# Patient Record
Sex: Male | Born: 1940 | Race: White | Hispanic: No | Marital: Married | Smoking: Former smoker
Health system: Southern US, Community
[De-identification: ages and names within clinical notes are randomized; demographics above are authoritative.]

## PROBLEM LIST (undated history)

## (undated) DIAGNOSIS — I70208 Unspecified atherosclerosis of native arteries of extremities, other extremity: Secondary | ICD-10-CM

## (undated) DIAGNOSIS — Z8679 Personal history of other diseases of the circulatory system: Secondary | ICD-10-CM

## (undated) DIAGNOSIS — I050 Rheumatic mitral stenosis: Secondary | ICD-10-CM

## (undated) DIAGNOSIS — K922 Gastrointestinal hemorrhage, unspecified: Secondary | ICD-10-CM

## (undated) DIAGNOSIS — J449 Chronic obstructive pulmonary disease, unspecified: Secondary | ICD-10-CM

## (undated) DIAGNOSIS — R0600 Dyspnea, unspecified: Secondary | ICD-10-CM

## (undated) DIAGNOSIS — I272 Pulmonary hypertension, unspecified: Secondary | ICD-10-CM

## (undated) DIAGNOSIS — I2510 Atherosclerotic heart disease of native coronary artery without angina pectoris: Secondary | ICD-10-CM

## (undated) DIAGNOSIS — Z9289 Personal history of other medical treatment: Secondary | ICD-10-CM

## (undated) DIAGNOSIS — Z953 Presence of xenogenic heart valve: Secondary | ICD-10-CM

## (undated) DIAGNOSIS — N400 Enlarged prostate without lower urinary tract symptoms: Secondary | ICD-10-CM

## (undated) DIAGNOSIS — I5032 Chronic diastolic (congestive) heart failure: Secondary | ICD-10-CM

## (undated) DIAGNOSIS — I099 Rheumatic heart disease, unspecified: Secondary | ICD-10-CM

## (undated) DIAGNOSIS — N390 Urinary tract infection, site not specified: Secondary | ICD-10-CM

## (undated) DIAGNOSIS — Z9889 Other specified postprocedural states: Secondary | ICD-10-CM

## (undated) DIAGNOSIS — K219 Gastro-esophageal reflux disease without esophagitis: Secondary | ICD-10-CM

## (undated) DIAGNOSIS — I4891 Unspecified atrial fibrillation: Secondary | ICD-10-CM

## (undated) HISTORY — DX: Other specified postprocedural states: Z98.890

## (undated) HISTORY — DX: Urinary tract infection, site not specified: N39.0

## (undated) HISTORY — DX: Rheumatic heart disease, unspecified: I09.9

## (undated) HISTORY — DX: Atherosclerotic heart disease of native coronary artery without angina pectoris: I25.10

## (undated) HISTORY — PX: CATARACT EXTRACTION, BILATERAL: SHX1313

## (undated) HISTORY — DX: Gastrointestinal hemorrhage, unspecified: K92.2

## (undated) HISTORY — DX: Unspecified atherosclerosis of native arteries of extremities, other extremity: I70.208

## (undated) HISTORY — DX: Rheumatic mitral stenosis: I05.0

## (undated) HISTORY — DX: Unspecified atrial fibrillation: I48.91

## (undated) HISTORY — DX: Chronic diastolic (congestive) heart failure: I50.32

## (undated) HISTORY — DX: Chronic obstructive pulmonary disease, unspecified: J44.9

## (undated) HISTORY — DX: Pulmonary hypertension, unspecified: I27.20

## (undated) HISTORY — DX: Personal history of other diseases of the circulatory system: Z86.79

## (undated) HISTORY — DX: Benign prostatic hyperplasia without lower urinary tract symptoms: N40.0

---

## 2009-11-30 DIAGNOSIS — K922 Gastrointestinal hemorrhage, unspecified: Secondary | ICD-10-CM

## 2009-11-30 HISTORY — PX: PERCUTANEOUS CORONARY STENT INTERVENTION (PCI-S): SHX6016

## 2009-11-30 HISTORY — DX: Gastrointestinal hemorrhage, unspecified: K92.2

## 2009-12-19 DIAGNOSIS — I2510 Atherosclerotic heart disease of native coronary artery without angina pectoris: Secondary | ICD-10-CM

## 2009-12-19 HISTORY — DX: Atherosclerotic heart disease of native coronary artery without angina pectoris: I25.10

## 2010-05-19 ENCOUNTER — Ambulatory Visit: Payer: Self-pay | Admitting: Thoracic Surgery (Cardiothoracic Vascular Surgery)

## 2010-06-09 ENCOUNTER — Ambulatory Visit: Payer: Self-pay | Admitting: Thoracic Surgery (Cardiothoracic Vascular Surgery)

## 2010-06-14 ENCOUNTER — Inpatient Hospital Stay (HOSPITAL_COMMUNITY)
Admission: RE | Admit: 2010-06-14 | Discharge: 2010-06-23 | Payer: Self-pay | Admitting: Thoracic Surgery (Cardiothoracic Vascular Surgery)

## 2010-06-17 ENCOUNTER — Encounter: Payer: Self-pay | Admitting: Thoracic Surgery (Cardiothoracic Vascular Surgery)

## 2010-06-18 ENCOUNTER — Ambulatory Visit: Payer: Self-pay | Admitting: Thoracic Surgery (Cardiothoracic Vascular Surgery)

## 2010-06-18 DIAGNOSIS — Z9889 Other specified postprocedural states: Secondary | ICD-10-CM

## 2010-06-18 DIAGNOSIS — Z8679 Personal history of other diseases of the circulatory system: Secondary | ICD-10-CM | POA: Insufficient documentation

## 2010-06-18 HISTORY — DX: Other specified postprocedural states: Z98.890

## 2010-06-18 HISTORY — PX: MAZE: SHX5063

## 2010-06-18 HISTORY — DX: Personal history of other diseases of the circulatory system: Z86.79

## 2010-06-18 HISTORY — PX: MITRAL VALVE REPAIR: SHX2039

## 2010-06-30 ENCOUNTER — Ambulatory Visit: Payer: Self-pay | Admitting: Thoracic Surgery (Cardiothoracic Vascular Surgery)

## 2010-06-30 ENCOUNTER — Encounter
Admission: RE | Admit: 2010-06-30 | Discharge: 2010-06-30 | Payer: Self-pay | Admitting: Thoracic Surgery (Cardiothoracic Vascular Surgery)

## 2010-08-11 ENCOUNTER — Encounter
Admission: RE | Admit: 2010-08-11 | Discharge: 2010-08-11 | Payer: Self-pay | Admitting: Thoracic Surgery (Cardiothoracic Vascular Surgery)

## 2010-08-11 ENCOUNTER — Ambulatory Visit: Payer: Self-pay | Admitting: Thoracic Surgery (Cardiothoracic Vascular Surgery)

## 2010-11-10 ENCOUNTER — Ambulatory Visit: Payer: Self-pay | Admitting: Thoracic Surgery (Cardiothoracic Vascular Surgery)

## 2011-02-14 LAB — BLOOD GAS, ARTERIAL
Bicarbonate: 27.1 mEq/L — ABNORMAL HIGH (ref 20.0–24.0)
Drawn by: 103701
FIO2: 0.21 %
O2 Saturation: 95.3 %
TCO2: 28.2 mmol/L (ref 0–100)
pO2, Arterial: 65.1 mmHg — ABNORMAL LOW (ref 80.0–100.0)

## 2011-02-14 LAB — POCT I-STAT 3, ART BLOOD GAS (G3+)
Acid-base deficit: 3 mmol/L — ABNORMAL HIGH (ref 0.0–2.0)
Acid-base deficit: 3 mmol/L — ABNORMAL HIGH (ref 0.0–2.0)
Acid-base deficit: 3 mmol/L — ABNORMAL HIGH (ref 0.0–2.0)
Acid-base deficit: 4 mmol/L — ABNORMAL HIGH (ref 0.0–2.0)
Acid-base deficit: 6 mmol/L — ABNORMAL HIGH (ref 0.0–2.0)
Bicarbonate: 22.3 mEq/L (ref 20.0–24.0)
Bicarbonate: 23.6 mEq/L (ref 20.0–24.0)
O2 Saturation: 100 %
O2 Saturation: 94 %
O2 Saturation: 98 %
Patient temperature: 37.6
Patient temperature: 38.1
TCO2: 22 mmol/L (ref 0–100)
TCO2: 23 mmol/L (ref 0–100)
TCO2: 25 mmol/L (ref 0–100)
pCO2 arterial: 35.8 mmHg (ref 35.0–45.0)
pCO2 arterial: 47.6 mmHg — ABNORMAL HIGH (ref 35.0–45.0)
pH, Arterial: 7.395 (ref 7.350–7.450)
pO2, Arterial: 358 mmHg — ABNORMAL HIGH (ref 80.0–100.0)
pO2, Arterial: 70 mmHg — ABNORMAL LOW (ref 80.0–100.0)

## 2011-02-14 LAB — POCT I-STAT 4, (NA,K, GLUC, HGB,HCT)
Glucose, Bld: 103 mg/dL — ABNORMAL HIGH (ref 70–99)
Glucose, Bld: 106 mg/dL — ABNORMAL HIGH (ref 70–99)
Glucose, Bld: 145 mg/dL — ABNORMAL HIGH (ref 70–99)
Glucose, Bld: 158 mg/dL — ABNORMAL HIGH (ref 70–99)
Glucose, Bld: 92 mg/dL (ref 70–99)
Glucose, Bld: 96 mg/dL (ref 70–99)
Glucose, Bld: 99 mg/dL (ref 70–99)
Glucose, Bld: 99 mg/dL (ref 70–99)
HCT: 18 % — ABNORMAL LOW (ref 39.0–52.0)
HCT: 23 % — ABNORMAL LOW (ref 39.0–52.0)
HCT: 23 % — ABNORMAL LOW (ref 39.0–52.0)
HCT: 27 % — ABNORMAL LOW (ref 39.0–52.0)
Hemoglobin: 6.8 g/dL — CL (ref 13.0–17.0)
Hemoglobin: 7.5 g/dL — ABNORMAL LOW (ref 13.0–17.0)
Hemoglobin: 7.5 g/dL — ABNORMAL LOW (ref 13.0–17.0)
Hemoglobin: 7.8 g/dL — ABNORMAL LOW (ref 13.0–17.0)
Hemoglobin: 8.8 g/dL — ABNORMAL LOW (ref 13.0–17.0)
Hemoglobin: 9.2 g/dL — ABNORMAL LOW (ref 13.0–17.0)
Potassium: 2.4 mEq/L — CL (ref 3.5–5.1)
Potassium: 3.3 mEq/L — ABNORMAL LOW (ref 3.5–5.1)
Potassium: 3.4 mEq/L — ABNORMAL LOW (ref 3.5–5.1)
Potassium: 3.5 mEq/L (ref 3.5–5.1)
Potassium: 3.5 mEq/L (ref 3.5–5.1)
Potassium: 3.6 mEq/L (ref 3.5–5.1)
Potassium: 4.2 mEq/L (ref 3.5–5.1)
Sodium: 128 mEq/L — ABNORMAL LOW (ref 135–145)
Sodium: 134 mEq/L — ABNORMAL LOW (ref 135–145)
Sodium: 140 mEq/L (ref 135–145)
Sodium: 140 mEq/L (ref 135–145)
Sodium: 141 mEq/L (ref 135–145)

## 2011-02-14 LAB — BASIC METABOLIC PANEL
BUN: 14 mg/dL (ref 6–23)
BUN: 15 mg/dL (ref 6–23)
CO2: 23 mEq/L (ref 19–32)
CO2: 27 mEq/L (ref 19–32)
CO2: 28 mEq/L (ref 19–32)
CO2: 28 mEq/L (ref 19–32)
CO2: 29 mEq/L (ref 19–32)
Calcium: 7.7 mg/dL — ABNORMAL LOW (ref 8.4–10.5)
Calcium: 7.8 mg/dL — ABNORMAL LOW (ref 8.4–10.5)
Calcium: 7.8 mg/dL — ABNORMAL LOW (ref 8.4–10.5)
Calcium: 8.2 mg/dL — ABNORMAL LOW (ref 8.4–10.5)
Chloride: 100 mEq/L (ref 96–112)
Chloride: 108 mEq/L (ref 96–112)
Creatinine, Ser: 0.66 mg/dL (ref 0.4–1.5)
Creatinine, Ser: 0.77 mg/dL (ref 0.4–1.5)
Creatinine, Ser: 0.83 mg/dL (ref 0.4–1.5)
Creatinine, Ser: 1.03 mg/dL (ref 0.4–1.5)
GFR calc Af Amer: >60 mL/min (ref >60)
GFR calc Af Amer: >60 mL/min (ref >60)
GFR calc Af Amer: >60 mL/min (ref >60)
GFR calc Af Amer: >60 mL/min (ref >60)
GFR calc Af Amer: >60 mL/min (ref >60)
GFR calc non Af Amer: >60 mL/min (ref >60)
GFR calc non Af Amer: >60 mL/min (ref >60)
GFR calc non Af Amer: >60 mL/min (ref >60)
Glucose, Bld: 101 mg/dL — ABNORMAL HIGH (ref 70–99)
Glucose, Bld: 104 mg/dL — ABNORMAL HIGH (ref 70–99)
Glucose, Bld: 106 mg/dL — ABNORMAL HIGH (ref 70–99)
Glucose, Bld: 134 mg/dL — ABNORMAL HIGH (ref 70–99)
Potassium: 3.2 mEq/L — ABNORMAL LOW (ref 3.5–5.1)
Potassium: 3.2 mEq/L — ABNORMAL LOW (ref 3.5–5.1)
Sodium: 132 mEq/L — ABNORMAL LOW (ref 135–145)
Sodium: 133 mEq/L — ABNORMAL LOW (ref 135–145)
Sodium: 135 mEq/L (ref 135–145)

## 2011-02-14 LAB — CBC
HCT: 20.9 % — ABNORMAL LOW (ref 39.0–52.0)
HCT: 22.6 % — ABNORMAL LOW (ref 39.0–52.0)
HCT: 25.3 % — ABNORMAL LOW (ref 39.0–52.0)
HCT: 25.3 % — ABNORMAL LOW (ref 39.0–52.0)
HCT: 26.1 % — ABNORMAL LOW (ref 39.0–52.0)
HCT: 26.2 % — ABNORMAL LOW (ref 39.0–52.0)
HCT: 32.4 % — ABNORMAL LOW (ref 39.0–52.0)
Hemoglobin: 10.1 g/dL — ABNORMAL LOW (ref 13.0–17.0)
Hemoglobin: 10.2 g/dL — ABNORMAL LOW (ref 13.0–17.0)
Hemoglobin: 7.6 g/dL — ABNORMAL LOW (ref 13.0–17.0)
Hemoglobin: 8 g/dL — ABNORMAL LOW (ref 13.0–17.0)
Hemoglobin: 8.5 g/dL — ABNORMAL LOW (ref 13.0–17.0)
Hemoglobin: 8.6 g/dL — ABNORMAL LOW (ref 13.0–17.0)
Hemoglobin: 8.8 g/dL — ABNORMAL LOW (ref 13.0–17.0)
Hemoglobin: 8.9 g/dL — ABNORMAL LOW (ref 13.0–17.0)
MCH: 26.9 pg (ref 26.0–34.0)
MCH: 27.6 pg (ref 26.0–34.0)
MCH: 27.8 pg (ref 26.0–34.0)
MCH: 28.3 pg (ref 26.0–34.0)
MCH: 28.5 pg (ref 26.0–34.0)
MCH: 28.6 pg (ref 26.0–34.0)
MCH: 28.7 pg (ref 26.0–34.0)
MCH: 28.8 pg (ref 26.0–34.0)
MCH: 28.9 pg (ref 26.0–34.0)
MCHC: 32.1 g/dL (ref 30.0–36.0)
MCHC: 32.5 g/dL (ref 30.0–36.0)
MCHC: 32.6 g/dL (ref 30.0–36.0)
MCHC: 32.9 g/dL (ref 30.0–36.0)
MCHC: 33.4 g/dL (ref 30.0–36.0)
MCHC: 33.5 g/dL (ref 30.0–36.0)
MCHC: 33.5 g/dL (ref 30.0–36.0)
MCHC: 33.5 g/dL (ref 30.0–36.0)
MCV: 84.3 fL (ref 78.0–100.0)
MCV: 86.1 fL (ref 78.0–100.0)
MCV: 86.6 fL (ref 78.0–100.0)
Platelets: 146 10*3/uL — ABNORMAL LOW (ref 150–400)
Platelets: 227 10*3/uL (ref 150–400)
Platelets: 240 10*3/uL (ref 150–400)
Platelets: 306 10*3/uL (ref 150–400)
Platelets: 325 10*3/uL (ref 150–400)
Platelets: 372 10*3/uL (ref 150–400)
Platelets: 413 10*3/uL — ABNORMAL HIGH (ref 150–400)
RBC: 2.65 MIL/uL — ABNORMAL LOW (ref 4.22–5.81)
RBC: 2.77 MIL/uL — ABNORMAL LOW (ref 4.22–5.81)
RBC: 2.79 MIL/uL — ABNORMAL LOW (ref 4.22–5.81)
RBC: 2.94 MIL/uL — ABNORMAL LOW (ref 4.22–5.81)
RBC: 3.02 MIL/uL — ABNORMAL LOW (ref 4.22–5.81)
RBC: 3.04 MIL/uL — ABNORMAL LOW (ref 4.22–5.81)
RBC: 3.15 MIL/uL — ABNORMAL LOW (ref 4.22–5.81)
RBC: 3.19 MIL/uL — ABNORMAL LOW (ref 4.22–5.81)
RBC: 3.63 MIL/uL — ABNORMAL LOW (ref 4.22–5.81)
RBC: 3.7 MIL/uL — ABNORMAL LOW (ref 4.22–5.81)
RDW: 17.1 % — ABNORMAL HIGH (ref 11.5–15.5)
RDW: 17.3 % — ABNORMAL HIGH (ref 11.5–15.5)
RDW: 17.5 % — ABNORMAL HIGH (ref 11.5–15.5)
RDW: 17.9 % — ABNORMAL HIGH (ref 11.5–15.5)
WBC: 16.2 10*3/uL — ABNORMAL HIGH (ref 4.0–10.5)
WBC: 7.8 10*3/uL (ref 4.0–10.5)
WBC: 8.7 10*3/uL (ref 4.0–10.5)
WBC: 9.1 10*3/uL (ref 4.0–10.5)
WBC: 9.6 10*3/uL (ref 4.0–10.5)

## 2011-02-14 LAB — TYPE AND SCREEN
ABO/RH(D): A POS
ABO/RH(D): A POS
Antibody Screen: NEGATIVE

## 2011-02-14 LAB — HEMOGLOBIN AND HEMATOCRIT, BLOOD: HCT: 20 % — ABNORMAL LOW (ref 39.0–52.0)

## 2011-02-14 LAB — POCT I-STAT, CHEM 8
Calcium, Ion: 1.09 mmol/L — ABNORMAL LOW (ref 1.12–1.32)
HCT: 26 % — ABNORMAL LOW (ref 39.0–52.0)
HCT: 26 % — ABNORMAL LOW (ref 39.0–52.0)
Hemoglobin: 8.8 g/dL — ABNORMAL LOW (ref 13.0–17.0)
Hemoglobin: 8.8 g/dL — ABNORMAL LOW (ref 13.0–17.0)
Sodium: 137 mEq/L (ref 135–145)
Sodium: 138 mEq/L (ref 135–145)
TCO2: 20 mmol/L (ref 0–100)
TCO2: 23 mmol/L (ref 0–100)

## 2011-02-14 LAB — GLUCOSE, CAPILLARY
Glucose-Capillary: 102 mg/dL — ABNORMAL HIGH (ref 70–99)
Glucose-Capillary: 104 mg/dL — ABNORMAL HIGH (ref 70–99)
Glucose-Capillary: 105 mg/dL — ABNORMAL HIGH (ref 70–99)
Glucose-Capillary: 105 mg/dL — ABNORMAL HIGH (ref 70–99)
Glucose-Capillary: 108 mg/dL — ABNORMAL HIGH (ref 70–99)
Glucose-Capillary: 109 mg/dL — ABNORMAL HIGH (ref 70–99)
Glucose-Capillary: 126 mg/dL — ABNORMAL HIGH (ref 70–99)
Glucose-Capillary: 142 mg/dL — ABNORMAL HIGH (ref 70–99)
Glucose-Capillary: 63 mg/dL — ABNORMAL LOW (ref 70–99)
Glucose-Capillary: 98 mg/dL (ref 70–99)
Glucose-Capillary: 99 mg/dL (ref 70–99)

## 2011-02-14 LAB — URINALYSIS, DIPSTICK ONLY
Bilirubin Urine: NEGATIVE
Hgb urine dipstick: NEGATIVE
Specific Gravity, Urine: 1.017 (ref 1.005–1.030)
Urobilinogen, UA: 1 mg/dL (ref 0.0–1.0)

## 2011-02-14 LAB — GLUCOSE, SEROUS FLUID: Glucose, Fluid: 96 mg/dL

## 2011-02-14 LAB — DIFFERENTIAL
Basophils Absolute: 0 10*3/uL (ref 0.0–0.1)
Basophils Relative: 0 % (ref 0–1)
Eosinophils Absolute: 0.2 10*3/uL (ref 0.0–0.7)
Eosinophils Relative: 3 % (ref 0–5)
Lymphocytes Relative: 9 % — ABNORMAL LOW (ref 12–46)
Lymphs Abs: 0.9 10*3/uL (ref 0.7–4.0)
Monocytes Absolute: 0.8 10*3/uL (ref 0.1–1.0)
Monocytes Relative: 9 % (ref 3–12)
Neutro Abs: 7.6 10*3/uL (ref 1.7–7.7)
Neutrophils Relative %: 80 % — ABNORMAL HIGH (ref 43–77)

## 2011-02-14 LAB — MAGNESIUM
Magnesium: 2.1 mg/dL (ref 1.5–2.5)
Magnesium: 3.1 mg/dL — ABNORMAL HIGH (ref 1.5–2.5)

## 2011-02-14 LAB — PROTIME-INR
INR: 1.08 (ref 0.00–1.49)
Prothrombin Time: 13.9 seconds (ref 11.6–15.2)
Prothrombin Time: 17.5 seconds — ABNORMAL HIGH (ref 11.6–15.2)

## 2011-02-14 LAB — ABO/RH: ABO/RH(D): A POS

## 2011-02-14 LAB — COMPREHENSIVE METABOLIC PANEL
Alkaline Phosphatase: 102 U/L (ref 39–117)
BUN: 17 mg/dL (ref 6–23)
Calcium: 8.8 mg/dL (ref 8.4–10.5)
Chloride: 103 mEq/L (ref 96–112)
Creatinine, Ser: 1.2 mg/dL (ref 0.4–1.5)
GFR calc Af Amer: >60 mL/min (ref >60)
Potassium: 3.9 mEq/L (ref 3.5–5.1)
Sodium: 139 mEq/L (ref 135–145)
Total Bilirubin: 0.4 mg/dL (ref 0.3–1.2)

## 2011-02-14 LAB — CREATININE, SERUM
Creatinine, Ser: 0.65 mg/dL (ref 0.4–1.5)
GFR calc Af Amer: >60 mL/min (ref >60)
GFR calc Af Amer: >60 mL/min (ref >60)
GFR calc non Af Amer: >60 mL/min (ref >60)

## 2011-02-14 LAB — BODY FLUID CULTURE: Culture: NO GROWTH

## 2011-02-14 LAB — BODY FLUID CELL COUNT WITH DIFFERENTIAL
Eos, Fluid: 0 %
Neutrophil Count, Fluid: 4 % (ref 0–25)

## 2011-02-14 LAB — PREPARE PLATELETS

## 2011-02-14 LAB — PREPARE FRESH FROZEN PLASMA

## 2011-02-14 LAB — LACTATE DEHYDROGENASE, PLEURAL OR PERITONEAL FLUID

## 2011-04-14 NOTE — Assessment & Plan Note (Signed)
OFFICE VISIT   JAVI, BOLLMAN  DOB:  1941-09-21                                        June 30, 2010  CHART #:  [id number]   HISTORY OF PRESENT ILLNESS:  The patient returns for routine followup  status post right miniature thoracotomy for mitral valve repair and maze  procedure on June 18, 2010.  His postoperative recovery has been  uncomplicated.  Since hospital discharge, the patient continued to do  remarkably well.  He returns to the office today with no complaints  other than the fact that he has had some serous drainage from his right  groin incision.  He has had no shortness of breath, and in fact he  states that his breathing is much better than it was prior to surgery.  He has had mild soreness in the right lateral chest wall, but this has  not bothered him much and he is hardly taking any pain relievers.  He  uses them primarily only to help him rest at night.  He has been up and  about and ambulating regularly.  He has been going to [employer] and  walking inside where it is air conditioned.  His exercise tolerance is  improving nicely.  His appetite is good.  He is sleeping well.  Overall,  he has absolutely no complaints.  Medications remain unchanged from the  time of hospital discharge.   PHYSICAL EXAMINATION:  Notable for a well-appearing thin male with blood  pressure 104/66, pulse 64 and regular.  Two-channel telemetry rhythm  strip demonstrates normal sinus rhythm.  Oxygen saturation is 99% on  room air.  Examination of the chest reveals mini thoracotomy incision  that is healing nicely.  Auscultation reveals clear breath sounds which  are hollow and distant, typical for the patient's underlying chronic  obstructive pulmonary disease.  No wheezes, rales, or rhonchi are noted.  Cardiovascular exam demonstrates regular rate and rhythm.  No murmurs,  rubs, or gallops are noted.  The abdomen is soft, nontender.  The  extremities are warm and  well perfused.  There is no lower extremity  edema.  The right groin incision is clean and intact.  There is no  surrounding erythema.  There has been some serous drainage, and on  palpation some clear serous fluid can be expressed through the incision  itself consistent with lymph.  No other abnormalities are noted.   DIAGNOSTIC TESTS:  Chest x-ray performed today at the [facility] is reviewed.  This demonstrates clear lung fields bilaterally  with the exception of small bilateral pleural effusions, more so on the  right than the left.  The right pleural effusion appears to be partially  loculated.  The lung fields are otherwise clear.  No other abnormalities  are noted.   IMPRESSION:  The patient appears to be doing very well.  He does have a  small loculated right pleural effusion.  He also appears to have  developed a lymphocele that has spontaneously drained through his right  groin surgical incision.  There is no sign of infection.  Overall, he is  doing well and he is maintaining sinus rhythm.  He feels terrific.   PLAN:  I have encouraged the patient to continue to gradually increase  his physical activity as tolerated.  I think that within  the next week  he can start driving an automobile once the soreness in his right  lateral chest wall has continued to abate.  I have instructed him to  keep his right groin incision clean and dry.  As long as it is draining,  he should keep a dry dressing.  I have cautioned he and his wife to keep  an eye on it and make sure and call and return to see [place] here in the  office should he develop any increased swelling, redness, or change in  drainage to have more purulent or suspicious appearance of character.  All of his questions have been addressed.  We will plan to see him back  in 4 weeks for further followup with repeat chest x-ray.  We have not  made any changes in his current medications.   Salvatore Decent. Cornelius Moras, M.D.   Electronically Signed   CHO/MEDQ  D:  06/30/2010  T:  06/30/2010  Job:  [id number]   cc:   Ruben Reason. Rhona Leavens, MD  [facility]

## 2011-04-14 NOTE — Assessment & Plan Note (Signed)
OFFICE VISIT   Carlos Norton, Carlos Norton  DOB:  January 02, 1941                                        August 11, 2010  CHART #:  [id number]   HISTORY OF PRESENT ILLNESS:  The patient returns for followup status  post right mini thoracotomy for mitral valve repair and maze procedure  on June 18, 2010.  He was last seen here in the office on June 30, 2010.  Since then, he has continued to do very well.  He was seen in  followup by Dr. Rhona Leavens last week.  He has had no problems at all.  The  patient returns to the office today and states that he feels quite well.  He has minimal residual soreness in his chest.  He is not taking any  sort of pain relievers any longer.  He has no shortness of breath either  with activity or at rest.  He has been increasing his physical activity  without difficulty.  He has not had any tachy palpitations or dizzy  spells.  His appetite is good.  He is sleeping well at night.  He has  absolutely no complaints.   CURRENT MEDICATIONS:  1. Amiodarone 200 mg daily.  2. Aspirin 81 mg daily.  3. Caltrate.  4. Lipitor.  5. Plavix.  6. Potassium.  7. Lasix.  8. Coreg.  9. Protonix.  10.Xopenex.  11.Spiriva.  12.Symbicort.  13.Albuterol.   PHYSICAL EXAMINATION:  Notable for a well-appearing male with blood  pressure 131/72, pulse 51 and regular, and oxygen saturation 98% on room  air.  Two-channel telemetry rhythm strip demonstrates sinus rhythm.  Examination of the chest reveals well-healed mini thoracotomy incision.  Breath sounds are clear to auscultation and symmetrical bilaterally.  No  wheezes, rales, or rhonchi are noted.  Cardiovascular exam includes  regular rate and rhythm.  No murmurs, rubs, or gallops are appreciated.  The abdomen is soft and nontender.  The right groin incision has healed  completely.  There is no lower extremity edema.  The extremities are  warm and well perfused.  The remainder of his physical exam is  noncontributory.   DIAGNOSTIC TESTS:  Chest x-ray performed today at the [facility] is reviewed.  This demonstrates trivial residual right pleural  effusion, which is considerably decreasing a person with his last x-ray.  The lung fields are otherwise clear without any significant abnormality.   IMPRESSION:  Excellent progress following right mini thoracotomy for  mitral valve repair and maze procedure.  The patient is doing very well  and is maintaining sinus rhythm.   PLAN:  I have instructed the patient to cut his dose of amiodarone to  100 mg daily.  He will stop amiodarone completely in 2 months when he  runs out of his current prescription refill.  We have otherwise not made  any changes to his medications.  We will plan to see him back in 3  months' time for rhythm check and followup.  He has been advised that he  can return to normal physical activity at this point in time without any  physical restrictions.  All of his questions have been addressed.   Salvatore Decent. Cornelius Moras, M.D.  Electronically Signed   CHO/MEDQ  D:  08/11/2010  T:  08/12/2010  Job:  [id number]  cc:   Dr. Eulah Citizen  [facility]

## 2011-04-14 NOTE — H&P (Signed)
HISTORY AND PHYSICAL EXAMINATION   June 09, 2010   Re:  Carlos Norton          DOB:  [birth year]   Date of planned hospital admission is June 14, 2010.   REASON FOR CONSULTATION:  Severe mitral regurgitation.   HISTORY OF PRESENT ILLNESS:  The patient is a 70 year old gentleman from  [place], [place] with history of mitral regurgitation, congestive  heart failure, recurrent persistent atrial fibrillation and atrial  flutter, two-vessel coronary artery disease, and chronic obstructive  pulmonary disease.  The patient first developed symptoms of congestive  heart failure in January of this year, at which time he was hospitalized  at [facility].  He initially presented in class  IV congestive heart failure with pulmonary edema.  Cardiac  echocardiogram performed at that time revealed normal left ventricular  systolic function with moderate-to-severe mitral regurgitation.  CT scan  of the chest revealed no sign of pulmonary embolus, but confirmed the  presence of pulmonary edema and bilateral pleural effusions.  His heart  failure was treated medically.  He underwent cardiac catheterization and  was found to have severe two-vessel coronary artery disease with mitral  regurgitation.  He then underwent percutaneous coronary intervention  with placement of drug-eluting stents in both the large obtuse marginal  branch of the left circumflex coronary artery and the right coronary  artery.  He initially did well, but he was readmitted to the hospital  again the following month in rapid atrial fibrillation with recurrent  class IV congestive heart failure.  He was stabilized medically and  underwent transesophageal echocardiogram with DC cardioversion.  He also  underwent bilateral thoracentesis for bilateral pleural effusions.  He  was started on Coumadin at that time.  He was also receiving Plavix ever  since his coronary stents replaced  the month previously.  He went back  into atrial fibrillation and underwent repeat cardioversion in March of  this year.  He again developed class IV congestive heart failure after  this procedure for which he was hospitalized for several days.  He again  was treated medically and improved.  In May, he was again hospitalized  with what was felt to be hospital-acquired pneumonia as well as severe  anemia with occult GI blood loss.  The patient received a total of 12  units of blood and underwent upper GI endoscopy, colonoscopy, and later  capsule endoscopy.  A source for GI bleeding was never discovered.  Coumadin therapy was discontinued and he has been maintained only on  Plavix since that time.  He has not had any further GI bleeding.  He was  readmitted yet again to [facility] on May 10, 2010, with recurrent rapid atrial fibrillation and class IV congestive  heart failure.  Repeat transthoracic echocardiogram confirmed the  presence of severe mitral regurgitation with some decreased left  ventricular function, ejection fraction now down to 35%.  The patient  underwent repeat left and right heart catheterization.  This  demonstrated the presence of patent stents in both right coronary artery  and the obtuse marginal branch of the left circumflex coronary artery.  There was moderate to severe pulmonary hypertension.  Left ventricular  ejection fraction is estimated 45-50%.  Cardiothoracic surgical  consultation was requested to consider elective surgical intervention.  The patient now returns to our office today with plans to proceed with  elective mitral valve repair or replacement and maze procedure next  week.  Since his most recent hospital discharge, he has remained stable.  He has not had any further episodes of resting shortness of breath.  He  has not had any tachy palpitations.  His appetite is good.  He did  suffer a fall 2 weeks ago when he hit his  left side, but the soreness  from this has resolved.  He had some soreness in his ankle that has also  gotten better.  He otherwise remains quite stable.  He is scheduled to  see Dr. Eulis Foster for followup of his underlying chronic obstructive  pulmonary disease later this week.  He has been told that he will  undergo repeat pulmonary function testing at that time.  He desires to  go ahead and proceed with surgery as soon as practical given the fact  that he has been rehospitalized so many times over the last 6 months  with recurrent congestive heart failure.   REVIEW OF SYSTEMS:  Remain unchanged from his last consultation note.   PAST MEDICAL HISTORY:  1. Mitral regurgitation.  2. Congestive heart failure.  3. Recurrent persistent atrial fibrillation and atrial flutter.  4. Coronary artery disease status post PCI and stenting of the obtuse      marginal branch of the left circumflex coronary artery and the      right coronary artery, January 2011.  5. Chronic obstructive pulmonary disease.  6. Remote tobacco use.  7. Benign prostatic hypertrophy.  8. History of rheumatic fever during childhood.   PAST SURGICAL HISTORY:  Tonsillectomy.   FAMILY HISTORY:  Noncontributory.   CURRENT MEDICATIONS:  1. Amiodarone 200 mg twice daily.  2. Aspirin 81 mg daily.  3. Caltrate D 1 tablet daily.  4. Lipitor 20 mg daily.  5. Nitroglycerin as needed.  6. Plavix 75 mg daily.  7. Potassium 20 mEq twice daily.  8. Coreg 3.125 mg twice daily.  9. Protonix 40 mg twice daily.  10.Xopenex as needed.  11.Spiriva inhaler.  12.Symbicort inhaler.  13.Lasix 40 mg daily.   DRUG ALLERGIES:  None known.   PHYSICAL EXAMINATION:  The patient is a thin white male appears his  stated age, in no acute distress.  Oxygen saturation 98% on room air.  HEENT exam is unrevealing.  Auscultation of the chest reveals clear  breath sounds with somewhat hollow breath sounds.  No wheezes, rales, or  rhonchi are  noted.  Cardiovascular exam is notable for regular rate and  rhythm.  There is a grade 2/6 systolic murmur heard best along the apex  with radiation to the axilla.  No diastolic murmurs are noted.  The  abdomen is soft and nontender.  The extremities are warm and well  perfused.  There is no lower extremity edema.  Distal pulses are  palpable in both lower legs at the ankle.  Rectal and GU exams are both  deferred.  Neurologic examination is grossly nonfocal and symmetrical  throughout.   DIAGNOSTIC TESTS:  Transesophageal echocardiogram performed May 16, 2010, at [facility] is reviewed.  This  demonstrates severe mitral regurgitation with functional anatomy  consistent with rheumatic heart disease.  There is functional type IIIA  restriction of both the anterior and posterior leaflets during both  systole and diastole.  The posterior leaflet is particularly thickened  and restricted.  There does not appear to be significant mitral  stenosis.  There is mild left ventricular dysfunction.  Left and right  heart catheterization performed May 16, 2010, is also reviewed.  This  demonstrates patent stents in the right coronary artery and proximal  portion of the obtuse marginal branch of the left circumflex coronary  artery with otherwise no significant flow-limiting coronary artery  disease.  There is moderate pulmonary hypertension.  Aortogram of the  abdominal aorta and iliac vessels reveals no sign of significant  aortoiliac disease.   IMPRESSION:  Severe mitral regurgitation with longstanding history of  rheumatic heart disease now with recurrent episodes of class IV  congestive heart failure and recurrent persistent atrial fibrillation.  The patient also recently underwent percutaneous coronary intervention  and stenting of the left circumflex coronary artery and the right  coronary artery using drug-eluting stents in January 2011.   PLAN:  We discussed  treating him medically for another six months or so  to allow him to stop Plavix with less risk of acute stent thrombosis,  but Mr. Congrove is appropriately concerned by the fact that he has been  hospitalized 5 times since the first of the year with class IV  congestive heart failure.  As such it probably makes sense to proceed  with surgery at this time.  We plan hospital admission on June 14, 2010,  to begin Integrilin infusion to bridge the patient between now and the  time of surgery.  He has been instructed to stop taking Plavix after  taking it on Wednesday, June 11, 2010.  We will bring him in to the  hospital on the 16th to begin Plavix infusion.  We tentatively plan to  proceed with surgery on Wednesday, June 18, 2010.  All of his questions  have been addressed.   Carlos Norton, M.D.  Electronically Signed   CHO/MEDQ  D:  06/09/2010  T:  06/10/2010  Job:  [id number]   cc:   Eulah Citizen, MD  Bethanie Dicker, MD  Tarri Fuller, MD

## 2011-04-14 NOTE — Assessment & Plan Note (Signed)
OFFICE VISIT   Carlos Norton, Carlos Norton  DOB:  Nov 09, 1941                                        November 10, 2010  CHART #:  [id number]   HISTORY OF PRESENT ILLNESS:  The patient returns for followup status  post right mini thoracotomy for mitral valve repair and maze procedure  on June 18, 2010.  He was last seen here in the office on August 11, 2010.  Since then, the patient has done exceptionally well overall.  He  was seen in followup by Dr. Rhona Leavens in the office and underwent a followup  echocardiogram that reportedly looked good.  We have not seen the report  from this exam as of yet.  Several weeks later, the patient did develop  an episode of shingles, but he is recovering from this fairly well at  this point in time.  He has not had any shortness of breath and he  reports that his breathing is much better than it was a year ago.  He  has not had any tachy palpitations or dizzy spells.  He has been off of  amiodarone now for more than a month and he overall feels quite well  other than his recent episode of shingles.  The remainder of his review  of systems is completely unremarkable.   CURRENT MEDICATIONS:  Aspirin, Plavix, Lipitor, Coreg, Protonix, Xopenex  inhaler, Spiriva, Symbicort inhaler, Lasix, Caltrate, and iron.   PHYSICAL EXAMINATION:  Notable for well-appearing male with blood  pressure 106/64 and pulse is 66 and regular.  Two-channel telemetry  rhythm strip demonstrates normal sinus rhythm.  Oxygen saturation is  100% on room air.  Examination of the chest reveals clear breath sounds  that are symmetrical bilaterally.  No wheezes, rales, or rhonchi are  noted.  Cardiovascular exam is notable for regular rate and rhythm.  No  murmurs, rubs, or gallops are noted.  The abdomen is soft and nontender.  The extremities are warm and well perfused.  There is no lower extremity  edema.  The remainder of his physical exam is unremarkable.   IMPRESSION:  The patient is doing exceptionally well following mitral  valve repair and a maze procedure last July.  He is maintaining sinus  rhythm and getting along very nicely.   PLAN:  We will plan to see the patient back for routine followup and  rhythm check in 6 months.  All of his questions have been addressed.   Salvatore Decent. Cornelius Moras, M.D.  Electronically Signed   CHO/MEDQ  D:  11/10/2010  T:  11/11/2010  Job:  [id number]   cc:   Dr. Su Grand

## 2011-04-17 NOTE — Consult Note (Signed)
NEW PATIENT CONSULTATION   Carlos Norton, Carlos Norton  DOB:                                                    May 20, 2008  CHART #:  [id number]   REASON FOR CONSULTATION:  Severe mitral regurgitation.   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  The patient states that he has been  reasonably healthy most of his life and free of significant medical  problems until January of this year.  At that time, he developed  progressive symptoms of shortness of breath that ultimately culminated  in hospital admission December 13, 2009, at [facility] medical record number [phone]).  At that time, the  patient was hospitalized with acute pulmonary edema.  Cardiac enzymes  revealed mildly elevated Troponin.  A 2-D echocardiogram revealed normal  left ventricular systolic function with moderate-to-severe mitral  regurgitation.  CT scan of the chest revealed no sign of any pulmonary  embolism, but confirmed the presence of pulmonary edema and pleural  effusions.  He was treated medically and ultimately underwent cardiac  catheterization.  He was found to have severe two-vessel coronary artery  disease in addition to his mitral regurgitation and congestive heart  failure.  He was treated with percutaneous coronary intervention and  placement of drug-eluting stents in the large obtuse marginal branch of  the left circumflex coronary artery and the right coronary artery.  He  was readmitted to the hospital in February with rapid atrial  fibrillation and recurrent class IV congestive heart failure.  He was  stabilized medically and underwent transesophageal echocardiogram with  DC  cardioversion.  He also underwent bilateral thoracentesis for  bilateral pleural effusions during that hospital admission.  He was  started on Coumadin in addition to the Plavix he had been receiving  following his coronary stents.  He went back into atrial fibrillation  and underwent repeat DC cardioversion on February 27, 2010.  This procedure  was complicated by acute pulmonary edema and respiratory failure for  which he was hospitalized for several days.  He responded to medical  therapy and was again discharged home.  In May, he was hospitalized with  fevers, chills, and what was felt to be hospital-acquired pneumonia  complicated by anemia secondary to hematochezia.  He underwent upper GI  endoscopy and colonoscopy, and he later underwent capsule endoscopy.  A  definitive source for GI bleeding was never encountered.  Coumadin  therapy was discontinued.  He has not had any GI bleeding since.  He was  readmitted to the hospital again on May 10, 2010, with recurrent rapid  atrial fibrillation and congestive heart failure associated with resting  shortness of breath and orthopnea.  During this hospitalization, he  underwent followup transthoracic echocardiogram on May 12, 2010.  This  confirmed the presence of moderate-to-severe mitral regurgitation with  now decreased left ventricular function and ejection fraction estimated  35%.  There is moderate to severe left atrial enlargement with mild  pulmonary hypertension and mild tricuspid regurgitation.  The patient  later underwent transesophageal echocardiogram on  May 16, 2010.  This  confirmed the presence of severe mitral regurgitation.  The aortic valve  was normal and there was no aortic insufficiency.  There was trivial  tricuspid regurgitation.  Left ventricular ejection fraction was  estimated 45-50%.  No other significant abnormalities were noted.  Left  and right heart catheterization was performed that same day.  This   demonstrated presence of patent stents in the right coronary artery and  the obtuse marginal branch of the left circumflex coronary artery  without significant restenosis or other progression of coronary artery  disease.  There is moderate-to-severe pulmonary hypertension with PA  pressures measured 61/28 with pulmonary capillary wedge pressure of 19  and large V-waves on the wedge tracing consistent with severe mitral  regurgitation.  Cardiothoracic surgical consultation has now been  requested.   REVIEW OF SYSTEMS:  GENERAL:  The patient reports normal appetite prior  to admission.  His energy level has been down for several months and he  feels fatigued.  CARDIAC:  The patient denies any recent history of chest pain, chest  tightness, or chest pressure other than some mild tightness that occurs  in association with tachy palpitations.  Prior to admission, the patient  was complaining of resting shortness of breath and orthopnea.  He has  not had lower extremity edema.  His breathing has improved with medical  therapy and improved rate control of his atrial fibrillation and atrial  flutter.  He denies any syncopal episodes or dizzy spells.  RESPIRATORY:  Notable for the absence of any productive cough,  hemoptysis, wheezing.  GASTROINTESTINAL:  Negative.  The patient reports normal bowel function  with some occasional constipation.  He has not had any hematochezia,  hematemesis, nor melena since he stopped taking Coumadin.  GENITOURINARY:  Negative.  PERIPHERAL VASCULAR:  Negative.  MUSCULOSKELETAL:  Negative.  HEENT:  Negative.  INFECTIOUS:  Negative.   PAST MEDICAL HISTORY:  1. Mitral regurgitation.  2. Congestive heart failure.  3. Recurrent persistent atrial fibrillation and atrial flutter.  4. Coronary artery disease.  5. Chronic obstructive pulmonary disease.  6. Remote tobacco use.  7. Benign prostatic hypertrophy.  8. History of rheumatic fever during childhood.    PAST SURGICAL HISTORY:  Tonsillectomy.   FAMILY HISTORY:  Noncontributory.   CURRENT MEDICATIONS:  1. Amiodarone 200 mg twice daily.  2. Aspirin 81 mg daily.  3. Caltrate 600+D 1 tablet twice daily.  4. Lipitor 20 mg daily.  5. Plavix 75 mg daily.  6. Potassium chloride 20 mEq twice daily.  7. Coreg 3.125 mg twice daily.  8. Protonix 40 mg twice daily.  9. Spiriva 1 puff daily.  10.Symbicort inhaler 1-2 puffs twice daily.  11.Xopenex 2 puffs every 6 hours as needed.  12.Albuterol 2 puffs as needed.  13.Nitroglycerin 0.4 mg sublingual as needed.   DRUG ALLERGIES:  None known.   PHYSICAL EXAMINATION:  The patient is a thin white male who appears his  stated age, in no acute distress.  Pulse is regular.  Two-channel  telemetry rhythm strip demonstrates what may be atrial flutter.  Examination of the chest reveals somewhat barrel-shaped chest.  Breath  sounds are clear and symmetrical.  Cardiovascular exam is notable for  regular rate and rhythm.  There is a grade 3/6 systolic murmur heard  best at the apex with radiation all across the precordium, to the  axilla, and back.  No diastolic murmurs are noted.  The abdomen is soft,  nondistended, and nontender.  There are no palpable masses.  The  extremities are warm and adequately perfused.  There is no lower  extremity edema.  Femoral pulses are palpable.  Distal pulses are  thready, but palpable.  There is no sign of venous insufficiency.  Rectal and GU exams are both deferred.  Neurologic examination is  grossly nonfocal and symmetrical throughout.   DIAGNOSTIC TESTS:  Transesophageal echocardiogram performed by Dr. Rhona Leavens  on May 16, 2010, is reviewed.  This demonstrates severe mitral  regurgitation.  There is restriction of both the anterior and posterior  leaflets of the mitral valve during both systole and diastole  (functional type IIIA).  The posterior leaflet is particularly thickened  and restricted.  In some views, the  anterior leaflet does not look too  bad and moves reasonably well.  There does not appear to be any  significant mitral stenosis.  There is severe mitral regurgitation.  The  jet of regurgitation is brought in central with slight eccentric  orientation towards the posterior left atrium.  The jet fills the left  atrium.  There is mild left ventricular dysfunction.  The aortic valve  appears normal.  There is trivial tricuspid regurgitation.  The left  atrium was enlarged.   Left and right heart catheterization performed May 16, 2010, by Dr.  Rhona Leavens is also reviewed.  This demonstrates patent stents in the right  coronary artery and in the proximal portion of the large obtuse marginal  branch of the left circumflex coronary artery.  There is otherwise no  significant flow-limiting coronary artery disease appreciated.  There is  moderate pulmonary hypertension with data as noted previously.  Aortogram of the abdominal aorta and iliac vessels reveals no sign of  significant aortoiliac disease.   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  He first presented with congestive heart failure  in January.  At that time, he was noted to have two-vessel coronary  artery disease and he was treated with percutaneous coronary  intervention and stenting using drug-eluting stents.  He has been  rehospitalized four times since then with a combination of related  problems including recurrent congestive heart failure, recurrent  persistent atrial fibrillation, and iron-deficient anemia while on  Coumadin and Plavix therapy.  Transesophageal echocardiogram and  catheterization confirms the presence of severe mitral regurgitation.  I  agree that he would best be treated with surgical intervention.  It is  possible that his mitral valve may be repairable, although the  functional anatomy is consistent with rheumatic disease.  Long-term  durability following  mitral valve repair in these circumstances may be  somewhat limited, but nonetheless potentially better than valve  replacement if the valve itself can be repaired.  Alternatives include  continued medical therapy versus proceeding with surgery.  I favor  surgical intervention.  Concomitant Maze procedure should be considered.  He may be a reasonably good candidate for minimally invasive approach.  Nonetheless, the risks of surgery will be elevated due to his comorbid  medical problems and now decreased left ventricular function.   PLAN:  I have discussed matters at length with the patient and his wife  here in his hospital room at [facility].  Alternative treatment strategies have been discussed.  He desires to  proceed with surgery as soon as practical.  We plan to allow him to  recover from his recent acute exacerbation of congestive heart failure.  We will plan to see  him in the office on June 09, 2010.  If he continues  to do well at that time, we will make plans to stop his Plavix in  anticipation of surgery on June 18, 2010.  All of his questions have  been addressed.   Salvatore Decent. Cornelius Moras, M.D.  Electronically Signed   CHO/MEDQ  D:  05/20/2010  T:  05/20/2010  Job:  [id number]   cc:   Dr. Eulah Citizen  [facility]

## 2011-05-04 ENCOUNTER — Encounter (INDEPENDENT_AMBULATORY_CARE_PROVIDER_SITE_OTHER): Payer: Medicare Other | Admitting: Thoracic Surgery (Cardiothoracic Vascular Surgery)

## 2011-05-04 DIAGNOSIS — I059 Rheumatic mitral valve disease, unspecified: Secondary | ICD-10-CM

## 2011-05-04 DIAGNOSIS — I4891 Unspecified atrial fibrillation: Secondary | ICD-10-CM

## 2011-05-04 NOTE — Assessment & Plan Note (Signed)
OFFICE VISIT  Carlos, Norton DOB:  1941-08-24                                        May 04, 2011 CHART #:  [id number]  HISTORY OF PRESENT ILLNESS:  The patient returns for followup status post right miniature thoracotomy for mitral valve repair and maze procedure on June 18, 2010.  He was last seen here in the office on November 10, 2010.  Since then, he has continued to do very well.  He states that he had a followup echocardiogram performed at Dr. Johnna Acosta office in March of this year.  Clinically, he has done exceptionally well.  He has no shortness of breath.  He has no chest pain.  He has no tachypalpitations.  His exercise tolerance is quite good.  The remainder of his review of systems is entirely unremarkable.  The remainder of his past medical history is unchanged.  PHYSICAL EXAMINATION:  A well-appearing male with blood pressure 109/66, pulse 54 and regular.  Two-channel telemetry rhythm strip demonstrates normal sinus rhythm.  Examination of the chest reveals clear breath sounds that are symmetrical bilaterally.  Cardiovascular exam is notable for regular rate and rhythm.  No murmurs, rubs, or gallops are noted. The abdomen is soft and nontender.  The extremities are warm and well perfused.  There is no lower extremity edema.  IMPRESSION:  The patient appears to be doing exceptionally well now almost 1 year following mitral valve repair and maze procedure.  He is maintaining sinus rhythm and doing very nicely.  PLAN:  We will ask that a copy of the report from the echocardiogram performed in March of this year, be sent for our records and review.  We will plan to see the patient back in 1 year's time for routine followup and rhythm check.  Carlos Norton. Cornelius Moras, M.D. Electronically Signed  CHO/MEDQ  D:  05/04/2011  T:  05/04/2011  Job:  [id number]  cc:   Eulah Citizen, MD Tarri Fuller, MD [facility]

## 2012-05-02 ENCOUNTER — Encounter: Payer: Medicare Other | Admitting: Thoracic Surgery (Cardiothoracic Vascular Surgery)

## 2012-05-16 ENCOUNTER — Encounter: Payer: Medicare Other | Admitting: Thoracic Surgery (Cardiothoracic Vascular Surgery)

## 2012-06-13 ENCOUNTER — Encounter: Payer: Medicare Other | Admitting: Thoracic Surgery (Cardiothoracic Vascular Surgery)

## 2012-06-27 ENCOUNTER — Encounter: Payer: Medicare Other | Admitting: Thoracic Surgery (Cardiothoracic Vascular Surgery)

## 2012-07-18 ENCOUNTER — Encounter: Payer: Medicare Other | Admitting: Thoracic Surgery (Cardiothoracic Vascular Surgery)

## 2012-08-15 ENCOUNTER — Ambulatory Visit (INDEPENDENT_AMBULATORY_CARE_PROVIDER_SITE_OTHER): Payer: Medicare Other | Admitting: Thoracic Surgery (Cardiothoracic Vascular Surgery)

## 2012-08-15 ENCOUNTER — Encounter: Payer: Self-pay | Admitting: Thoracic Surgery (Cardiothoracic Vascular Surgery)

## 2012-08-15 ENCOUNTER — Other Ambulatory Visit: Payer: Self-pay

## 2012-08-15 VITALS — BP 105/63 | HR 60 | Ht 72.0 in | Wt 168.0 lb

## 2012-08-15 DIAGNOSIS — Z8679 Personal history of other diseases of the circulatory system: Secondary | ICD-10-CM

## 2012-08-15 DIAGNOSIS — N400 Enlarged prostate without lower urinary tract symptoms: Secondary | ICD-10-CM

## 2012-08-15 DIAGNOSIS — I099 Rheumatic heart disease, unspecified: Secondary | ICD-10-CM

## 2012-08-15 DIAGNOSIS — Z9889 Other specified postprocedural states: Secondary | ICD-10-CM

## 2012-08-15 DIAGNOSIS — J449 Chronic obstructive pulmonary disease, unspecified: Secondary | ICD-10-CM

## 2012-08-15 HISTORY — DX: Rheumatic heart disease, unspecified: I09.9

## 2012-08-15 HISTORY — DX: Chronic obstructive pulmonary disease, unspecified: J44.9

## 2012-08-15 HISTORY — DX: Benign prostatic hyperplasia without lower urinary tract symptoms: N40.0

## 2012-08-15 NOTE — Progress Notes (Signed)
                   [address]            [name] [address]          [phone]     CARDIOTHORACIC SURGERY OFFICE NOTE  Referring Provider is Theodoro Grist, MD PCP is Tarri Fuller, MD   HPI:  Patient returns for routine followup now more than 2 years status post minimally invasive mitral valve repair and maze procedure. He was last seen here in our office on 05/04/2011. Since then the patient has continued to do exceptionally well. He reports normal exercise tolerance with no shortness of breath at all. He has not had any chest pain. He has not had any palpitations or others signs of recurrence of atrial fibrillation. He has otherwise been in good health and reports no problems at all.   Current Outpatient Prescriptions  Medication Sig Dispense Refill  . atorvastatin (LIPITOR) 20 MG tablet       . carvedilol (COREG) 3.125 MG tablet       . clopidogrel (PLAVIX) 75 MG tablet       . furosemide (LASIX) 20 MG tablet       . KLOR-CON 10 10 MEQ tablet       . pantoprazole (PROTONIX) 40 MG tablet       . SPIRIVA HANDIHALER 18 MCG inhalation capsule           Physical Exam:   BP 105/63  Pulse 60  Ht 6' (1.829 m)  Wt 168 lb (76.204 kg)  BMI 22.78 kg/m2  SpO2 98%  General:  Well-appearing  Chest:   Clear to auscultation  CV:   Regular rate and rhythm without murmur  Incisions:  Completely healed  Abdomen:  Soft and nontender  Extremities:  Warm and well-perfused  Diagnostic Tests:  2 channel telemetry rhythm strip demonstrates normal sinus rhythm   Impression:  Patient is doing very well more than 2 years status post mitral valve repair and Maze procedure via right mini thoracotomy approach. He is maintaining sinus rhythm and enjoying normal exercise tolerance.  Plan:  In the future the patient will call and return to see [place] as needed. All of his questions been addressed.   Salvatore Decent. Cornelius Moras, MD 08/15/2012 2:35 PM

## 2016-04-09 DIAGNOSIS — I70208 Unspecified atherosclerosis of native arteries of extremities, other extremity: Secondary | ICD-10-CM | POA: Insufficient documentation

## 2016-04-09 HISTORY — PX: BRACHIAL EMBOLECTOMY: SHX1259

## 2016-04-09 HISTORY — DX: Unspecified atherosclerosis of native arteries of extremities, other extremity: I70.208

## 2016-05-07 HISTORY — PX: GREEN LIGHT LASER TURP (TRANSURETHRAL RESECTION OF PROSTATE: SHX6260

## 2016-07-10 HISTORY — PX: CARDIAC CATHETERIZATION: SHX172

## 2016-07-16 ENCOUNTER — Institutional Professional Consult (permissible substitution) (INDEPENDENT_AMBULATORY_CARE_PROVIDER_SITE_OTHER): Payer: Medicare Other | Admitting: Thoracic Surgery (Cardiothoracic Vascular Surgery)

## 2016-07-16 ENCOUNTER — Encounter: Payer: Self-pay | Admitting: Thoracic Surgery (Cardiothoracic Vascular Surgery)

## 2016-07-16 VITALS — BP 118/66 | HR 58 | Resp 16 | Ht 71.5 in | Wt 165.0 lb

## 2016-07-16 DIAGNOSIS — I70208 Unspecified atherosclerosis of native arteries of extremities, other extremity: Secondary | ICD-10-CM

## 2016-07-16 DIAGNOSIS — I099 Rheumatic heart disease, unspecified: Secondary | ICD-10-CM

## 2016-07-16 DIAGNOSIS — I272 Other secondary pulmonary hypertension: Secondary | ICD-10-CM

## 2016-07-16 DIAGNOSIS — Z8679 Personal history of other diseases of the circulatory system: Secondary | ICD-10-CM

## 2016-07-16 DIAGNOSIS — I050 Rheumatic mitral stenosis: Secondary | ICD-10-CM | POA: Diagnosis not present

## 2016-07-16 DIAGNOSIS — I5032 Chronic diastolic (congestive) heart failure: Secondary | ICD-10-CM

## 2016-07-16 DIAGNOSIS — I742 Embolism and thrombosis of arteries of the upper extremities: Secondary | ICD-10-CM | POA: Diagnosis not present

## 2016-07-16 DIAGNOSIS — Z9889 Other specified postprocedural states: Secondary | ICD-10-CM

## 2016-07-16 NOTE — Progress Notes (Signed)
[address]       [name] [id number]             [phone]     CARDIOTHORACIC SURGERY CONSULTATION REPORT  Referring Provider is Caryl Adahiu, Jenyung Andy, MD PCP is Tarri FullerESCAJEDA, RICHARD, MD  Chief Complaint  Patient presents with  . Mitral Stenosis    s/p MINI MVREPAIR/MAZE 2011...ECHO 04/10/16, CATH 07/10/16    HPI:  Patient is a 75 year old male with long-standing history of rheumatic heart disease, coronary artery disease status post PCI and stenting in the past, atrial fibrillation, and COPD who underwent minimally invasive mitral valve repair and Maze procedure on 06/18/2010 for severe mitral regurgitation and recurrent persistent atrial fibrillation.  The patient did well postoperatively and was last seen here in our office on 08/15/2012. He has never had any documented recurrence of atrial fibrillation and he was taken off of Coumadin anticoagulation because of history of intolerance due to GI bleeding while he was taking Coumadin and Plavix prior to his surgery. 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 He was [facility] where he underwent surgical embolectomy on 04/09/2016. He states that initially he was thought to be having a stroke but this was ruled out and ultimately the thrombus in the left brachial artery was identified. Transthoracic echocardiogram performed at that time revealed left ventricular hypertrophy with normal left ventricular systolic function, ejection fraction estimated 55-60%. There was mitral annular calcification with calcification of the mitral valve leaflets and "moderate to severe functional stenosis". The mean transvalvular gradient across the mitral valve was estimated 14 mmHg with mitral valve area calculated 0.85 cm. A definite source of the embolus was not identified and to the patient's  recollection he did not undergo TEE.  He was started on Eliquis at that time. After his surgical procedure he developed severe problems with bladder outlet obstruction for which he ultimately underwent transurethral laser prostatectomy in early June.  Ever since the patient has had problems with progressive symptoms of exertional shortness of breath.  He was seen in follow-up by Dr. Rhona Leavenshiu and underwent left and right heart catheterization on 07/10/2016. He was found to have mild nonobstructive coronary artery disease. Previously placed stents in the right coronary artery and the left circumflex coronary artery were widely patent. There was severe pulmonary hypertension with PA pressures measured 72/26 and mean pulmonary capillary wedge pressure 26 mmHg. Mean transvalvular gradient across the mitral valve was estimated 20 mmHg. The patient was referred for surgical consultation.  The patient is married and lives locally in [place] with his wife. He retired from [employer] business approximately one year ago. [place] amount of time there. The patient has remained physically active during his retirement until recently when he has been limited by exertional shortness of breath.  He states that he now gets short of breath with mild to moderate activity in this limits his activity to a significant degree. He denies any history of resting shortness of breath, PND, orthopnea, or lower extremity edema. He has not had any chest pain or chest tightness either with activity or at rest. He has not had any palpitations nor other symptoms to suggest a recurrence of atrial fibrillation. He states that while he was in the hospital recently he has always  been in sinus rhythm. He has not had any bleeding problems since he started taking Eliquis.  Past Medical History:  Diagnosis Date  . BPH (benign prostatic hyperplasia) 08/15/2012  . 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 Chronic diastolic (congestive) heart failure (HCC)   . COPD (chronic obstructive pulmonary disease) (HCC) 08/15/2012  . Coronary artery disease 12/19/2009   PCI and stenting of RCA and OM branch  . GI bleeding 2011   while patient on coumadin and Plavix - no source identified  . Mitral stenosis   . Pulmonary hypertension (HCC)   . Rheumatic heart disease 08/15/2012  . S/P Maze operation for atrial fibrillation 06/18/2010   Left side lesion set using cryothermy with oversewing of LA appendage  . 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 BRACHIAL EMBOLECTOMY Left 04/09/2016  . GREEN LIGHT LASER TURP (TRANSURETHRAL RESECTION OF PROSTATE  05/07/2016  . MAZE  06/18/2010  . MITRAL VALVE REPAIR  06/18/2010  . PERCUTANEOUS CORONARY STENT INTERVENTION (PCI-S)  11/2009   RCA and LCx    No family history on file.  Social History   Social History  . Marital status: Married    Spouse name: N/A  . Number of children: N/A  . Years of education: N/A   Occupational History  . Not on file.   Social History Main Topics  . Smoking status: Former Games developer  . Smokeless tobacco: Not on file  . Alcohol use Not on file  . Drug use: Unknown  . Sexual activity: Not on file   Other Topics Concern  . Not on file   Social History Narrative  . No narrative on file    Current Outpatient Prescriptions  Medication Sig Dispense Refill  . apixaban (ELIQUIS) 2.5 MG TABS tablet Take 2.5 mg by mouth 2 (two) times daily.    Marland Kitchen aspirin EC 81 MG tablet Take 81 mg by mouth daily.    Marland Kitchen atorvastatin (LIPITOR) 20 MG tablet     . budesonide-formoterol (SYMBICORT) 80-4.5 MCG/ACT inhaler Inhale 2 puffs into the lungs 2 (two) times daily.    . Calcium Carbonate-Vitamin D3 (CALCIUM 600/VITAMIN D) 600-400 MG-UNIT TABS  Take 1 tablet by mouth daily.    . furosemide (LASIX) 20 MG tablet 20 mg. Take on M-W-F    . KLOR-CON 10 10 MEQ tablet 10 mEq every other day.     . metoprolol succinate (TOPROL-XL) 25 MG 24 hr tablet Take 25 mg by mouth daily.    . pantoprazole (PROTONIX) 40 MG tablet 40 mg daily. TAKE AT 0600    . sildenafil (VIAGRA) 100 MG tablet Take 100 mg by mouth daily as needed for erectile dysfunction.    Marland Kitchen SPIRIVA HANDIHALER 18 MCG inhalation capsule 18 mcg daily.      No current facility-administered medications for this visit.     No Known Allergies    Review of Systems:   General:  normal appetite, normal energy, no weight gain, + 9 lb weight loss, no fever  Cardiac:  no chest pain with exertion, no chest pain at rest, + SOB with exertion, no resting SOB, no PND, no orthopnea, no palpitations, no arrhythmia, + remote h/o atrial fibrillation, no LE edema, no dizzy spells, no syncope  Respiratory:  + shortness of breath, no home  oxygen, no productive cough, no dry cough, no bronchitis, no wheezing, no hemoptysis, no asthma, no pain with inspiration or cough, no sleep apnea, no CPAP at night  GI:   no difficulty swallowing, no reflux, no frequent heartburn, no hiatal hernia, no abdominal pain, no constipation, no diarrhea, no hematochezia, no hematemesis, no melena  GU:   no dysuria,  no frequency, no urinary tract infection, no hematuria, no enlarged prostate, no kidney stones, no kidney disease  Vascular:  no pain suggestive of claudication, no pain in feet, + leg cramps, no varicose veins, no DVT, no non-healing foot ulcer  Neuro:   no stroke, no TIA's, no seizures, no headaches, no temporary blindness one eye,  no slurred speech, no peripheral neuropathy, no chronic pain, no instability of gait, no memory/cognitive dysfunction  Musculoskeletal: no arthritis, no joint swelling, no myalgias, no difficulty walking, normal mobility   Skin:   no rash, no itching, no skin infections, no pressure  sores or ulcerations  Psych:   no anxiety, no depression, no nervousness, no unusual recent stress  Eyes:   no blurry vision, no floaters, no recent vision changes, does not wear glasses or contacts  ENT:   no hearing loss, no loose or painful teeth, full set dentures, last saw dentist 6 months ago  Hematologic:  no easy bruising, no abnormal bleeding, no clotting disorder, no frequent epistaxis  Endocrine:  no diabetes, does not check CBG's at home     Physical Exam:   BP 118/66   Pulse (!) 58   Resp 16   Ht 5' 11.5" (1.816 m)   Wt 165 lb (74.8 kg)   SpO2 98% Comment: ON RA  BMI 22.69 kg/m   General:  Thin,  well-appearing  HEENT:  Unremarkable   Neck:   no JVD, no bruits, no adenopathy   Chest:   clear to auscultation, symmetrical breath sounds, no wheezes, no rhonchi   CV:   RRR, no murmur   Abdomen:  soft, non-tender, no masses   Extremities:  warm, well-perfused, pulses palpable, no LE edema  Rectal/GU  Deferred  Neuro:   Grossly non-focal and symmetrical throughout  Skin:   Clean and dry, no rashes, no breakdown   Diagnostic Tests:  TRANSTHORACIC ECHOCARDIOGRAM  Both the images and report from transthoracic echocardiogram performed 04/10/2016 are reviewed. There is left ventricular hypertrophy with normal left ventricular systolic function, left ventricular ejection fraction estimated 55-60%. There is a mitral annuloplasty ring in place consistent with previous mitral repair. There is no significant mitral regurgitation. There is mitral annular calcification with calcification of the mitral valve leaflets and "moderate to severe functional stenosis". Mitral valve area was calculated 0.85 cm. Mean transvalvular gradient ranged between 12 and 17 mmHg with peak velocity across the valve greater than 3 m/s. There was mild left atrial enlargement. Right ventricular size and function appeared normal. There was mild tricuspid regurgitation.   CARDIAC CATHETERIZATION  Both  images and report from diagnostic cardiac catheterization performed 07/10/2016 are reviewed. The patient has mild nonobstructive coronary artery disease. There are widely patent stents in both the left circumflex and the right coronary arteries. There is normal left ventricular systolic function. There is severe pulmonary hypertension with PA pressures measured 72/26. Central venous pressure was 4. Mean pulmonary Wedge pressure was 27. Transvalvular gradient across mitral valve was estimated 20 mmHg. Cardiac output was 3.9 L/m using thermodilution.    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  He presents with recent onset symptoms of exertional shortness of breath consistent with chronic diastolic congestive heart failure, New York Heart Association functional class IIB-III.  I have personally reviewed the patient's most recent transthoracic echocardiogram and diagnostic cardiac catheterization. Images from the echocardiogram are suboptimal but suggestive of significant progression of fibrosis and calcification in the mitral valve leaflets and transvalvular gradients appear to be fairly high.  The patient's left ventricular function remains normal but he has severe pulmonary hypertension. I agree that he may need to undergo mitral valve replacement. Given the patient's previous mitral valve repair and the degree of leaflet calcification, I would expect that balloon valvuloplasty probably not yield a favorable result.  Prior to the development of symptoms of shortness of breath the patient suffered a sudden embolic occlusion of left brachial artery for which he underwent embolectomy in May of this year. The patient has remote history of atrial fibrillation and underwent Maze procedure in 2011.  He has been maintaining sinus rhythm ever since and was not on pharmacologic  anticoagulation at the time of his embolic event. He is currently anticoagulated using Eliquis for presumed cardiac source. He apparently has not had transesophageal echocardiogram performed.  I favor proceeding with transesophageal echocardiogram to make certain there were no signs of left atrial clot and to more carefully evaluate the functional anatomy and severity of the patient's mitral stenosis. Assuming that TEE confirms the presence of severe mitral stenosis with findings unfavorable for balloon mitral valvuloplasty it would be reasonable to proceed with elective redo mitral valve replacement in the near future.   Plan:  The patient and his wife were counseled at length regarding the indications, risks and potential benefits of redo mitral valve replacement.  The rationale for elective surgery has been explained, including a comparison between surgery, balloon mitral valvuloplasty, and continued medical therapy with close follow-up.  We discussed the possibility of replacing the mitral valve using a mechanical prosthesis with the attendant need for long-term anticoagulation versus the alternative of replacing it using a bioprosthetic tissue valve with its potential for late structural valve deterioration and failure, depending upon the patient's longevity.   Expectations for the patient's postoperative convalescence a been discussed.  All of their questions have been answered.  The patient's wife has just recently been diagnosed with breast cancer and is scheduled to undergo mastectomy and breast reconstruction in the near future. The patient is hopeful that he can delay his mitral valve replacement until after his wife has recovered from her surgery. Under the circumstances it seems reasonable.  We will make arrangements for the patient undergo transesophageal echocardiogram in the near future. He will return to our office in approximately 2-3 weeks to review the results of his TEE and further  discuss the timing of surgery.   I spent in excess of 90 minutes during the conduct of this office consultation and >50% of this time involved direct face-to-face encounter with the patient for counseling and/or coordination of their care.    Salvatore Decentlarence H. Cornelius Moraswen, MD 07/16/2016 4:06 PM

## 2016-07-16 NOTE — Patient Instructions (Signed)
Continue all previous medications without any changes at this time  

## 2016-07-21 ENCOUNTER — Encounter: Payer: Medicare Other | Admitting: Thoracic Surgery (Cardiothoracic Vascular Surgery)

## 2016-07-28 ENCOUNTER — Encounter: Payer: Self-pay | Admitting: Thoracic Surgery (Cardiothoracic Vascular Surgery)

## 2016-07-28 ENCOUNTER — Ambulatory Visit (INDEPENDENT_AMBULATORY_CARE_PROVIDER_SITE_OTHER): Payer: Medicare Other | Admitting: Thoracic Surgery (Cardiothoracic Vascular Surgery)

## 2016-07-28 ENCOUNTER — Other Ambulatory Visit: Payer: Self-pay | Admitting: *Deleted

## 2016-07-28 VITALS — BP 115/67 | HR 64 | Resp 20 | Ht 71.5 in | Wt 165.0 lb

## 2016-07-28 DIAGNOSIS — I742 Embolism and thrombosis of arteries of the upper extremities: Secondary | ICD-10-CM | POA: Diagnosis not present

## 2016-07-28 DIAGNOSIS — Z9889 Other specified postprocedural states: Secondary | ICD-10-CM

## 2016-07-28 DIAGNOSIS — I050 Rheumatic mitral stenosis: Secondary | ICD-10-CM

## 2016-07-28 DIAGNOSIS — I340 Nonrheumatic mitral (valve) insufficiency: Secondary | ICD-10-CM | POA: Diagnosis not present

## 2016-07-28 DIAGNOSIS — I051 Rheumatic mitral insufficiency: Secondary | ICD-10-CM | POA: Insufficient documentation

## 2016-07-28 DIAGNOSIS — I099 Rheumatic heart disease, unspecified: Secondary | ICD-10-CM | POA: Diagnosis not present

## 2016-07-28 NOTE — Progress Notes (Signed)
[address]       [name] [id number]             [phone]     CARDIOTHORACIC SURGERY OFFICE NOTE  Referring Provider is Caryl Adahiu, Jenyung Andy, MD PCP is Tarri FullerESCAJEDA, RICHARD, MD   HPI:  Patient returns to the office today for follow-up of severe symptomatic mitral stenosis in the setting of long-standing rheumatic heart disease status post mitral valve repair and maze procedure in 2011. He was seen in consultation on 07/16/2016. Since then he underwent transesophageal echocardiogram. This confirmed the presence of severe mitral stenosis. Mean gradient across mitral valve was estimated 14 mmHg corresponding to mitral valve area calculated 0.85 cm. There was mild to moderate mitral regurgitation. Left ventricular systolic function remains normal. There was no sign of left atrial thrombus. No other significant abnormalities were noted. The patient returns to our office today to discuss the results of this test and discuss treatment options further. The patient's wife has recently been diagnosed with breast cancer and has been scheduled for surgery, but this has been postponed several times. The patient states that he describes stable symptoms of exertional shortness of breath and fatigue with a dry nonproductive cough. Symptoms have not changed over the past few weeks. He has been taking Lasix and this has not seemed to made any difference.   Current Outpatient Prescriptions  Medication Sig Dispense Refill  . apixaban (ELIQUIS) 2.5 MG TABS tablet Take 2.5 mg by mouth 2 (two) times daily.    Marland Kitchen. aspirin EC 81 MG tablet Take 81 mg by mouth daily.    Marland Kitchen. atorvastatin (LIPITOR) 20 MG tablet Take 20 mg by mouth daily at 6 PM.     . budesonide-formoterol (SYMBICORT) 80-4.5 MCG/ACT inhaler Inhale 2 puffs into the lungs 2 (two) times daily.    . Calcium Carbonate-Vitamin D3 (CALCIUM 600/VITAMIN D) 600-400 MG-UNIT TABS Take 1 tablet by mouth daily.    . furosemide (LASIX) 20 MG tablet  20 mg. Take on M-W-F    . KLOR-CON 10 10 MEQ tablet 10 mEq every other day.     . metoprolol succinate (TOPROL-XL) 25 MG 24 hr tablet Take 25 mg by mouth daily.    . pantoprazole (PROTONIX) 40 MG tablet 40 mg daily. TAKE AT 0600    . sildenafil (VIAGRA) 100 MG tablet Take 100 mg by mouth daily as needed for erectile dysfunction.    Marland Kitchen. SPIRIVA HANDIHALER 18 MCG inhalation capsule 18 mcg daily.      No current facility-administered medications for this visit.       Physical Exam:   BP 115/67 (BP Location: Left Arm, Patient Position: Sitting, Cuff Size: Normal)   Pulse 64   Resp 20   Ht 5' 11.5" (1.816 m)   Wt 165 lb (74.8 kg)   SpO2 97% Comment: RA  BMI 22.69 kg/m   General:  Well-appearing  Chest:   Clear to auscultation  CV:   Regular rate and rhythm  Incisions:  n/a  Abdomen:  Soft nontender  Extremities:  Warm and well-perfused  Diagnostic Tests:  TRANSESOPHAGEAL ECHOCARDIOGRAM  Images from transesophageal echocardiogram performed 07/22/2016 are personally reviewed. The patient has previously undergone complex mitral valve repair for rheumatic mitral valve disease. There is mild to moderate residual mitral regurgitation with 2 jets that are directed somewhat eccentrically across the left atrium. There is moderate thickening with severely restricted leaflet mobility involving the anterior leaflet of the mitral valve. The  posterior leaflet seems to move somewhat better but it also is somewhat restricted. There is severe mitral stenosis. There is no left atrial thrombus. The left atrial appendage has been obliterated. Left ventricular function appears normal.  The aortic valve appears normal. There is trace tricuspid regurgitation.   Impression:  Patient has long-standing rheumatic heart disease with stage D severe symptomatic mitral stenosis and moderate mitral regurgitation, status post mitral valve repair in 2011. Left ventricular systolic function remains preserved.  There is  no sign of left atrial thrombus. Options include continued medical therapy versus redo mitral valve replacement.    Plan:  The patient and his wife were again counseled at length regarding the indications, risks and potential benefits of redo mitral valve replacement.  The rationale for elective surgery has been explained, including a comparison between surgery, balloon mitral valvuloplasty, and continued medical therapy with close follow-up.  We discussed the possibility of replacing the mitral valve using a mechanical prosthesis with the attendant need for long-term anticoagulation versus the alternative of replacing it using a bioprosthetic tissue valve with its potential for late structural valve deterioration and failure, depending upon the patient's longevity.   The patient specifically requests that his mitral valve be replaced using a bioprosthetic tissue valve.   The patient understands and accepts all potential risks of surgery including but not limited to risk of death, stroke or other neurologic complication, myocardial infarction, congestive heart failure, respiratory failure, renal failure, bleeding requiring transfusion and/or reexploration, arrhythmia, infection or other wound complications, pneumonia, pleural and/or pericardial effusion, pulmonary embolus, aortic dissection or other major vascular complication, or delayed complications related to valve repair or replacement including but not limited to structural valve deterioration and failure, thrombosis, embolization, endocarditis, or paravalvular leak.  Expectations for the patient's postoperative convalescence a been discussed.  All of their questions have been answered.  For variety of personal reasons the patient desires to wait until the middle of October before proceeding with surgery. We tentatively plan for surgery on 09/16/2016. The patient will return to our office on Monday, 09/07/2016 for follow-up prior to surgery.  I spent  in excess of 15 minutes during the conduct of this office consultation and >50% of this time involved direct face-to-face encounter with the patient for counseling and/or coordination of their care.   Salvatore Decent. Cornelius Moras, MD 07/28/2016 3:39 PM

## 2016-07-28 NOTE — Patient Instructions (Signed)
Continue all previous medications without any changes at this time  

## 2016-08-31 ENCOUNTER — Inpatient Hospital Stay (HOSPITAL_COMMUNITY)
Admission: AD | Admit: 2016-08-31 | Discharge: 2016-09-09 | DRG: 308 | Disposition: A | Discharge status: Home / Self Care | Payer: Medicare Other | Source: Ambulatory Visit | Attending: Cardiology | Admitting: Cardiology

## 2016-08-31 ENCOUNTER — Encounter: Payer: Self-pay | Admitting: Thoracic Surgery (Cardiothoracic Vascular Surgery)

## 2016-08-31 ENCOUNTER — Ambulatory Visit (INDEPENDENT_AMBULATORY_CARE_PROVIDER_SITE_OTHER): Payer: Medicare Other | Admitting: Thoracic Surgery (Cardiothoracic Vascular Surgery)

## 2016-08-31 ENCOUNTER — Observation Stay (HOSPITAL_COMMUNITY): Payer: Medicare Other

## 2016-08-31 ENCOUNTER — Encounter (HOSPITAL_COMMUNITY): Payer: Self-pay | Admitting: Emergency Medicine

## 2016-08-31 VITALS — BP 103/69 | HR 130 | Resp 18 | Ht 71.5 in | Wt 154.0 lb

## 2016-08-31 DIAGNOSIS — I050 Rheumatic mitral stenosis: Secondary | ICD-10-CM | POA: Diagnosis present

## 2016-08-31 DIAGNOSIS — I340 Nonrheumatic mitral (valve) insufficiency: Secondary | ICD-10-CM | POA: Diagnosis present

## 2016-08-31 DIAGNOSIS — I742 Embolism and thrombosis of arteries of the upper extremities: Secondary | ICD-10-CM

## 2016-08-31 DIAGNOSIS — R0602 Shortness of breath: Secondary | ICD-10-CM

## 2016-08-31 DIAGNOSIS — J449 Chronic obstructive pulmonary disease, unspecified: Secondary | ICD-10-CM | POA: Diagnosis present

## 2016-08-31 DIAGNOSIS — I099 Rheumatic heart disease, unspecified: Secondary | ICD-10-CM | POA: Diagnosis present

## 2016-08-31 DIAGNOSIS — I481 Persistent atrial fibrillation: Secondary | ICD-10-CM

## 2016-08-31 DIAGNOSIS — Z8679 Personal history of other diseases of the circulatory system: Secondary | ICD-10-CM

## 2016-08-31 DIAGNOSIS — I480 Paroxysmal atrial fibrillation: Secondary | ICD-10-CM | POA: Diagnosis present

## 2016-08-31 DIAGNOSIS — K5750 Diverticulosis of both small and large intestine without perforation or abscess without bleeding: Secondary | ICD-10-CM | POA: Diagnosis present

## 2016-08-31 DIAGNOSIS — K259 Gastric ulcer, unspecified as acute or chronic, without hemorrhage or perforation: Secondary | ICD-10-CM | POA: Diagnosis present

## 2016-08-31 DIAGNOSIS — I5032 Chronic diastolic (congestive) heart failure: Secondary | ICD-10-CM

## 2016-08-31 DIAGNOSIS — N390 Urinary tract infection, site not specified: Secondary | ICD-10-CM | POA: Diagnosis not present

## 2016-08-31 DIAGNOSIS — K5521 Angiodysplasia of colon with hemorrhage: Secondary | ICD-10-CM | POA: Diagnosis present

## 2016-08-31 DIAGNOSIS — N179 Acute kidney failure, unspecified: Secondary | ICD-10-CM | POA: Diagnosis present

## 2016-08-31 DIAGNOSIS — I5033 Acute on chronic diastolic (congestive) heart failure: Secondary | ICD-10-CM | POA: Diagnosis present

## 2016-08-31 DIAGNOSIS — Z955 Presence of coronary angioplasty implant and graft: Secondary | ICD-10-CM

## 2016-08-31 DIAGNOSIS — K5710 Diverticulosis of small intestine without perforation or abscess without bleeding: Secondary | ICD-10-CM | POA: Diagnosis present

## 2016-08-31 DIAGNOSIS — Q2733 Arteriovenous malformation of digestive system vessel: Secondary | ICD-10-CM

## 2016-08-31 DIAGNOSIS — R578 Other shock: Secondary | ICD-10-CM | POA: Diagnosis present

## 2016-08-31 DIAGNOSIS — Z9889 Other specified postprocedural states: Secondary | ICD-10-CM

## 2016-08-31 DIAGNOSIS — Z7901 Long term (current) use of anticoagulants: Secondary | ICD-10-CM | POA: Diagnosis not present

## 2016-08-31 DIAGNOSIS — E871 Hypo-osmolality and hyponatremia: Secondary | ICD-10-CM | POA: Diagnosis present

## 2016-08-31 DIAGNOSIS — I4892 Unspecified atrial flutter: Secondary | ICD-10-CM | POA: Diagnosis not present

## 2016-08-31 DIAGNOSIS — R630 Anorexia: Secondary | ICD-10-CM | POA: Diagnosis present

## 2016-08-31 DIAGNOSIS — D509 Iron deficiency anemia, unspecified: Secondary | ICD-10-CM | POA: Diagnosis present

## 2016-08-31 DIAGNOSIS — B3781 Candidal esophagitis: Secondary | ICD-10-CM | POA: Diagnosis present

## 2016-08-31 DIAGNOSIS — K449 Diaphragmatic hernia without obstruction or gangrene: Secondary | ICD-10-CM | POA: Diagnosis present

## 2016-08-31 DIAGNOSIS — Z5309 Procedure and treatment not carried out because of other contraindication: Secondary | ICD-10-CM | POA: Diagnosis present

## 2016-08-31 DIAGNOSIS — I051 Rheumatic mitral insufficiency: Secondary | ICD-10-CM

## 2016-08-31 DIAGNOSIS — Z7982 Long term (current) use of aspirin: Secondary | ICD-10-CM

## 2016-08-31 DIAGNOSIS — I2510 Atherosclerotic heart disease of native coronary artery without angina pectoris: Secondary | ICD-10-CM | POA: Diagnosis present

## 2016-08-31 DIAGNOSIS — B9689 Other specified bacterial agents as the cause of diseases classified elsewhere: Secondary | ICD-10-CM | POA: Diagnosis present

## 2016-08-31 DIAGNOSIS — R0600 Dyspnea, unspecified: Secondary | ICD-10-CM

## 2016-08-31 DIAGNOSIS — I4819 Other persistent atrial fibrillation: Secondary | ICD-10-CM

## 2016-08-31 DIAGNOSIS — D500 Iron deficiency anemia secondary to blood loss (chronic): Secondary | ICD-10-CM

## 2016-08-31 DIAGNOSIS — I272 Pulmonary hypertension, unspecified: Secondary | ICD-10-CM

## 2016-08-31 DIAGNOSIS — Z87891 Personal history of nicotine dependence: Secondary | ICD-10-CM

## 2016-08-31 DIAGNOSIS — I509 Heart failure, unspecified: Secondary | ICD-10-CM

## 2016-08-31 DIAGNOSIS — E440 Moderate protein-calorie malnutrition: Secondary | ICD-10-CM | POA: Insufficient documentation

## 2016-08-31 DIAGNOSIS — K5909 Other constipation: Secondary | ICD-10-CM | POA: Diagnosis present

## 2016-08-31 DIAGNOSIS — J9811 Atelectasis: Secondary | ICD-10-CM

## 2016-08-31 DIAGNOSIS — I4891 Unspecified atrial fibrillation: Secondary | ICD-10-CM | POA: Insufficient documentation

## 2016-08-31 DIAGNOSIS — N400 Enlarged prostate without lower urinary tract symptoms: Secondary | ICD-10-CM | POA: Diagnosis present

## 2016-08-31 DIAGNOSIS — K254 Chronic or unspecified gastric ulcer with hemorrhage: Secondary | ICD-10-CM | POA: Diagnosis present

## 2016-08-31 LAB — URINALYSIS, ROUTINE W REFLEX MICROSCOPIC
Bilirubin Urine: NEGATIVE
Glucose, UA: NEGATIVE mg/dL
Ketones, ur: NEGATIVE mg/dL
NITRITE: NEGATIVE
PH: 5.5 (ref 5.0–8.0)
Protein, ur: NEGATIVE mg/dL
SPECIFIC GRAVITY, URINE: 1.029 (ref 1.005–1.030)

## 2016-08-31 LAB — COMPREHENSIVE METABOLIC PANEL
ALT: 28 U/L (ref 17–63)
AST: 42 U/L — AB (ref 15–41)
Albumin: 3.2 g/dL — ABNORMAL LOW (ref 3.5–5.0)
Alkaline Phosphatase: 138 U/L — ABNORMAL HIGH (ref 38–126)
Anion gap: 9 (ref 5–15)
BUN: 27 mg/dL — AB (ref 6–20)
CHLORIDE: 99 mmol/L — AB (ref 101–111)
CO2: 28 mmol/L (ref 22–32)
CREATININE: 1.24 mg/dL (ref 0.61–1.24)
Calcium: 9.1 mg/dL (ref 8.9–10.3)
GFR calc Af Amer: >60 mL/min (ref >60)
GFR, EST NON AFRICAN AMERICAN: 56 mL/min — AB (ref >60)
Glucose, Bld: 131 mg/dL — ABNORMAL HIGH (ref 65–99)
Potassium: 3.9 mmol/L (ref 3.5–5.1)
Sodium: 136 mmol/L (ref 135–145)
Total Bilirubin: 0.7 mg/dL (ref 0.3–1.2)
Total Protein: 6.3 g/dL — ABNORMAL LOW (ref 6.5–8.1)

## 2016-08-31 LAB — URINE MICROSCOPIC-ADD ON

## 2016-08-31 LAB — MRSA PCR SCREENING: MRSA BY PCR: NEGATIVE

## 2016-08-31 LAB — BRAIN NATRIURETIC PEPTIDE
B NATRIURETIC PEPTIDE 5: 877 pg/mL — AB (ref 0.0–100.0)
B Natriuretic Peptide: 994.1 pg/mL — ABNORMAL HIGH (ref 0.0–100.0)

## 2016-08-31 LAB — MAGNESIUM: Magnesium: 2 mg/dL (ref 1.7–2.4)

## 2016-08-31 LAB — APTT: aPTT: 41 seconds — ABNORMAL HIGH (ref 24–36)

## 2016-08-31 LAB — TSH: TSH: 2.128 u[IU]/mL (ref 0.350–4.500)

## 2016-08-31 LAB — HEPARIN LEVEL (UNFRACTIONATED)

## 2016-08-31 MED ORDER — HEPARIN (PORCINE) IN NACL 100-0.45 UNIT/ML-% IJ SOLN
1000.0000 [IU]/h | INTRAMUSCULAR | Status: DC
  Administered 2016-08-31: 1000 [IU]/h via INTRAVENOUS
  Filled 2016-08-31: qty 250

## 2016-08-31 MED ORDER — MOMETASONE FURO-FORMOTEROL FUM 100-5 MCG/ACT IN AERO
2.0000 | INHALATION_SPRAY | Freq: Two times a day (BID) | RESPIRATORY_TRACT | Status: DC
  Administered 2016-08-31 – 2016-09-09 (×15): 2 via RESPIRATORY_TRACT
  Filled 2016-08-31 (×2): qty 8.8

## 2016-08-31 MED ORDER — ONDANSETRON HCL 4 MG/2ML IJ SOLN: 4.0000 mg | Freq: Four times a day (QID) | INTRAMUSCULAR | Status: DC | PRN

## 2016-08-31 MED ORDER — METOPROLOL SUCCINATE ER 25 MG PO TB24: 25.0000 mg | ORAL_TABLET | Freq: Every day | ORAL | Status: DC

## 2016-08-31 MED ORDER — OFF THE BEAT BOOK
Freq: Once | Status: AC
  Administered 2016-08-31: 23:00:00
  Filled 2016-08-31: qty 1

## 2016-08-31 MED ORDER — ASPIRIN EC 81 MG PO TBEC: 81.0000 mg | DELAYED_RELEASE_TABLET | Freq: Every day | ORAL | Status: DC

## 2016-08-31 MED ORDER — ACETAMINOPHEN 325 MG PO TABS: 650.0000 mg | ORAL_TABLET | ORAL | Status: DC | PRN

## 2016-08-31 MED ORDER — AMIODARONE HCL IN DEXTROSE 360-4.14 MG/200ML-% IV SOLN
60.0000 mg/h | INTRAVENOUS | Status: AC
  Administered 2016-08-31: 60 mg/h via INTRAVENOUS
  Filled 2016-08-31 (×2): qty 200

## 2016-08-31 MED ORDER — ENSURE ENLIVE PO LIQD: 237.0000 mL | Freq: Two times a day (BID) | ORAL | Status: DC

## 2016-08-31 MED ORDER — AMIODARONE HCL IN DEXTROSE 360-4.14 MG/200ML-% IV SOLN
30.0000 mg/h | INTRAVENOUS | Status: DC
  Administered 2016-09-01 – 2016-09-08 (×14): 30 mg/h via INTRAVENOUS
  Filled 2016-08-31 (×15): qty 200

## 2016-08-31 MED ORDER — TIOTROPIUM BROMIDE MONOHYDRATE 18 MCG IN CAPS
18.0000 ug | ORAL_CAPSULE | Freq: Every day | RESPIRATORY_TRACT | Status: DC
Start: 2016-08-31 — End: 2016-09-09
  Administered 2016-09-03 – 2016-09-09 (×7): 18 ug via RESPIRATORY_TRACT
  Filled 2016-08-31 (×4): qty 5

## 2016-08-31 MED ORDER — FUROSEMIDE 20 MG PO TABS: 20.0000 mg | ORAL_TABLET | Freq: Every day | ORAL | Status: DC

## 2016-08-31 MED ORDER — PANTOPRAZOLE SODIUM 40 MG PO TBEC: 40.0000 mg | DELAYED_RELEASE_TABLET | Freq: Every day | ORAL | Status: DC

## 2016-08-31 MED ORDER — ATORVASTATIN CALCIUM 20 MG PO TABS
20.0000 mg | ORAL_TABLET | Freq: Every day | ORAL | Status: DC
  Administered 2016-08-31 – 2016-09-08 (×9): 20 mg via ORAL
  Filled 2016-08-31 (×9): qty 1

## 2016-08-31 MED ORDER — AMIODARONE LOAD VIA INFUSION
150.0000 mg | Freq: Once | INTRAVENOUS | Status: AC
  Administered 2016-08-31: 150 mg via INTRAVENOUS
  Filled 2016-08-31: qty 83.34

## 2016-08-31 MED ORDER — POTASSIUM CHLORIDE ER 10 MEQ PO TBCR
10.0000 meq | EXTENDED_RELEASE_TABLET | ORAL | Status: DC
  Filled 2016-08-31: qty 1

## 2016-08-31 NOTE — Progress Notes (Signed)
[address]       [name] [address]             [phone]     CARDIOTHORACIC SURGERY OFFICE NOTE  Referring Provider is Caryl Ada, MD PCP is Tarri Fuller, MD   HPI:  Patient is a 75 year old male with long-standing history of rheumatic heart disease, coronary artery disease status post PCI and stenting in the past, atrial fibrillation, and COPD who returns to the office today for follow-up of severe symptomatic mitral stenosis, mitral regurgitation, and acute exacerbation of chronic diastolic congestive heart failure with tentative plans to proceed with redo mitral valve replacement later next week. The patient was originally seen in consultation on 07/16/2016, and he was last seen in our office on 07/28/2016. At that time the patient wanted to hold off on proceeding with surgery because his wife was getting ready to have surgery. We made tentative plans to proceed with surgery on 09/11/2016. He returns to our office today with his wife present. The patient states that over the past month he has gotten considerably worse. He now gets short of breath with minimal activity and occasionally at rest. He has not had any chest pain or chest tightness. He has been taking Lasix every day, and initially this seemed to help. However, over the past week or so his breathing has only gotten worse. He has lost nearly 20 pounds in weight. His appetite is poor. He has not had fevers or chills. Bowel function is normal. He has not had dizzy spells or syncope. The remainder of his review of systems is unchanged from previously.   Current Outpatient Prescriptions  Medication Sig Dispense Refill  . apixaban (ELIQUIS) 2.5 MG TABS tablet Take 2.5 mg by mouth 2 (two) times daily.    Marland Kitchen aspirin EC 81 MG tablet Take 81 mg by mouth daily.    Marland Kitchen atorvastatin (LIPITOR) 20 MG tablet Take 20 mg by mouth daily at 6 PM.     . budesonide-formoterol (SYMBICORT) 80-4.5 MCG/ACT inhaler  Inhale 2 puffs into the lungs 2 (two) times daily.    . Calcium Carbonate-Vitamin D3 (CALCIUM 600/VITAMIN D) 600-400 MG-UNIT TABS Take 1 tablet by mouth daily.    . furosemide (LASIX) 20 MG tablet 20 mg daily.     Marland Kitchen KLOR-CON 10 10 MEQ tablet 10 mEq every other day.     . metoprolol succinate (TOPROL-XL) 25 MG 24 hr tablet Take 25 mg by mouth daily.    . pantoprazole (PROTONIX) 40 MG tablet 40 mg daily. TAKE AT 0600    . sildenafil (VIAGRA) 100 MG tablet Take 100 mg by mouth daily as needed for erectile dysfunction.    Marland Kitchen SPIRIVA HANDIHALER 18 MCG inhalation capsule 18 mcg daily.      No current facility-administered medications for this visit.       Physical Exam:   BP 103/69   Pulse (!) 130   Resp 18 Comment: SHORT BREATHS  Ht 5' 11.5" (1.816 m)   Wt 154 lb (69.9 kg)   SpO2 97% Comment: ON ROOM AIR...MUST SIT FOR A WHILE TO ALLOW TO NORMAL LIMITS  BMI 21.18 kg/m   General:  Dyspneic at rest  Chest:   Clear to auscultation with slightly diminished breath sounds both lung bases  CV:   Tachycardic with irregular rate and rhythm  Incisions:  n/a  Abdomen:  Soft and nontender  Extremities:  Warm and well-perfused with mild lower extremity  edema, decreased skin turgor  Diagnostic Tests:  n/a   Impression:  Patient has developed acute exacerbation of chronic diastolic congestive heart failure with rapid pulse consistent with likely atrial fibrillation or atrial flutter with rapid ventricular response. He is dyspneic at rest and probably should be admitted to the hospital.    Plan:  I have discussed options with the patient and his wife here in the office today. We will make arrangements for him to be admitted directly to the hospital to the advanced heart failure service. At present he remains chronically anticoagulated using Eliquis.   It might be reasonable to consider loading him with amiodarone. DC cardioversion might be appropriate at some point.  Presuming that the patient's  condition can be stabilized and improved over the next few days, it might be reasonable to stop the patient's Eliquis and begin heparin intravenously as a bridge with plans to proceed with surgery next week as long as no contraindications arise.  I have discussed matters over the telephone with Dr. Shirlee LatchMcLean who has been kind enough to make arrangements for the patient to be admitted to hospital this afternoon. The patient and his wife understand and agree with the plan as outlined.  I spent in excess of 15 minutes during the conduct of this office consultation and >50% of this time involved direct face-to-face encounter with the patient for counseling and/or coordination of their care.    Salvatore Decentlarence H. Cornelius Moraswen, MD 08/31/2016 4:20 PM

## 2016-08-31 NOTE — Progress Notes (Signed)
ANTICOAGULATION CONSULT NOTE - Initial Consult  Pharmacy Consult for heparin Indication: atrial fibrillation  No Known Allergies  Patient Measurements: Height: 5\' 11"  (180.3 cm) Weight: 150 lb 9.2 oz (68.3 kg) IBW/kg (Calculated) : 75.3 Heparin Dosing Weight: 68 kg  Vital Signs: BP: 89/63 (10/02 2030) Pulse Rate: 124 (10/02 2030)  Labs:  Recent Labs  08/31/16 1934  APTT 41*  HEPARINUNFRC >2.20*  CREATININE 1.24    Estimated Creatinine Clearance: 51.3 mL/min (by C-G formula based on SCr of 1.24 mg/dL).  Assessment: 75 yo m presenting with afib from MD office  PMH: rheumatic heart disease, CAD, afib on apixaban, COPD  Anticoag: apixaban pta for afib - possible MVR in next week. Last dose 0700 10/2. Admit HL > 2.2, aptt 41  To heparin gtt  Nephro: SCr 1.24  Goal of Therapy:  Heparin level 0.3-0.7 units/ml aPTT 66-102 seconds Monitor platelets by anticoagulation protocol: Yes   Plan:  Heparin 1000 units/hr - no bolus d/t apixaban Daily HL, aptt , CBC  Isaac BlissMichael Jamine Highfill, PharmD, BCPS, [facility] Hospital[facility]CCCP Clinical Pharmacist Pager [phone] 08/31/2016 8:56 PM

## 2016-08-31 NOTE — H&P (Signed)
History & Physical    Patient ID: Carlos Norton MRN: [id number], DOB/AGE: [birth year]   Admit date: 08/31/2016   Primary Physician: Tarri Fuller, MD Primary Cardiologist: Vinie Sill)  Patient Profile    75 yo male with PMH of rheumatic heart disease, coronary artery disease status post PCI and stenting in the past, atrial fibrillation, and COPD who was seen in the office today by Dr. Cornelius Moras and noted to be in atrial fibrillation. Was sent to [facility] for direct admission.   Past Medical History    Past Medical History:  Diagnosis Date  . Atrial fibrillation (HCC)   . BPH (benign prostatic hyperplasia) 08/15/2012  . 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 Chronic diastolic (congestive) heart failure   . COPD (chronic obstructive pulmonary disease) (HCC) 08/15/2012  . Coronary artery disease 12/19/2009   PCI and stenting of RCA and OM branch  . GI bleeding 2011   while patient on coumadin and Plavix - no source identified  . Mitral stenosis   . Pulmonary hypertension   . Rheumatic heart disease 08/15/2012  . S/P Maze operation for atrial fibrillation 06/18/2010   Left side lesion set using cryothermy with oversewing of LA appendage  . 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 BRACHIAL EMBOLECTOMY Left 04/09/2016  . GREEN LIGHT LASER TURP (TRANSURETHRAL RESECTION OF PROSTATE  05/07/2016  . MAZE  06/18/2010  . MITRAL VALVE REPAIR  06/18/2010  . PERCUTANEOUS CORONARY STENT INTERVENTION (PCI-S)  11/2009   RCA and LCx     Allergies  No Known Allergies  History of Present Illness    Carlos Norton is a 75 year old male with past medical history of rheumatic heart disease, CAD status post PCI and stenting in the past, PAF and COPD. Currently followed by his cardiologist at [facility],  and was referred to Dr. Orvan July for follow-up of severe symptomatic mitral stenosis, mitral regurgitation and acute exacerbation of chronic diastolic heart failure with plans to proceed with redo mitral valve replacement next week. He was originally seen by Dr. Barry Dienes on 07/16/16 in consultation. On 07/28/2016 he was seen again and the patient wanted to hold on proceeding forward with surgery because his wife was getting ready to undergo neurosurgery herself. Tentative plans were made to proceed with the surgery on 09/11/2016.   He presented to the office on 08/31/16 where he reported over the past month he had gotten considerably worse and complained of dyspnea with minimal activity and occasionally at rest. Denied any chest pain or chest tightness. States he has been compliant with his home dose of Lasix which initially seemed to help. States over the past week he has progressively gotten worse and has noted a 20 pound weight loss at home. States his appetite is poor but has not had any dizziness or syncope. In the office he was noted to have a rapid pulse which was consistent with atrial fibrillation/A flutter. He does report being on Eliquis for anticoagulation for both PAF, and DVT.  Was suggested by Dr. Cornelius Moras to consider loading him with amiodarone, and transitioning from Eliquis to heparin with plans for possible surgery next week. His case was briefly discussed with Dr. Shirlee Latch, and patient will be admitted to heart failure services.  Home Medications    Prior to Admission medications  Medication Sig Start Date End Date Taking? Authorizing Provider  apixaban (ELIQUIS) 2.5 MG TABS tablet Take 2.5 mg by mouth 2 (two) times daily.    Historical Provider, MD  aspirin EC 81 MG tablet Take 81 mg by mouth daily.    Historical Provider, MD  atorvastatin (LIPITOR) 20 MG tablet Take 20 mg by mouth daily at 6 PM.  07/30/12   Historical Provider, MD  budesonide-formoterol (SYMBICORT) 80-4.5 MCG/ACT inhaler Inhale  2 puffs into the lungs 2 (two) times daily.    Historical Provider, MD  Calcium Carbonate-Vitamin D3 (CALCIUM 600/VITAMIN D) 600-400 MG-UNIT TABS Take 1 tablet by mouth daily.    Historical Provider, MD  furosemide (LASIX) 20 MG tablet 20 mg daily.  07/30/12   Historical Provider, MD  KLOR-CON 10 10 MEQ tablet 10 mEq every other day.  08/13/12   Historical Provider, MD  metoprolol succinate (TOPROL-XL) 25 MG 24 hr tablet Take 25 mg by mouth daily.    Historical Provider, MD  pantoprazole (PROTONIX) 40 MG tablet 40 mg daily. TAKE AT 0600 08/03/12   Historical Provider, MD  sildenafil (VIAGRA) 100 MG tablet Take 100 mg by mouth daily as needed for erectile dysfunction.    Historical Provider, MD  SPIRIVA HANDIHALER 18 MCG inhalation capsule 18 mcg daily.  08/13/12   Historical Provider, MD    Family History    History reviewed. No pertinent family history.  Social History    Social History   Social History  . Marital status: Married    Spouse name: N/A  . Number of children: N/A  . Years of education: N/A   Occupational History  . Not on file.   Social History Main Topics  . Smoking status: Former Games developermoker  . Smokeless tobacco: Never Used  . Alcohol use No  . Drug use: No  . Sexual activity: Not on file   Other Topics Concern  . Not on file   Social History Narrative  . No narrative on file     Review of Systems    General:  No chills, fever, night sweats or weight changes.  Cardiovascular: See HPI Dermatological: No rash, lesions/masses Respiratory: No cough, dyspnea Urologic: No hematuria, dysuria Abdominal:   No nausea, vomiting, diarrhea, bright red blood per rectum, melena, or hematemesis Neurologic:  No visual changes, wkns, changes in mental status. All other systems reviewed and are otherwise negative except as noted above.  Physical Exam    Blood pressure 103/81, pulse (!) 135, height 5\' 11"  (1.803 m), weight 150 lb 9.2 oz (68.Carlos kg).  General: Pleasant older  male, NAD Psych: Normal affect. Neuro: Alert and oriented X Carlos. Moves all extremities spontaneously. HEENT: Normal  Neck: Supple without bruits or JVD. Lungs:  Resp regular and unlabored, CTA. Heart: irregularly irregular no s3, s4, or murmurs. Abdomen: Soft, non-tender, non-distended, BS + x 4.  Extremities: No clubbing, cyanosis or edema. DP/PT/Radials 2+ and equal bilaterally.  Labs    Troponin (Point of Care Test) No results for input(s): TROPIPOC in the last 72 hours. No results for input(s): CKTOTAL, CKMB, TROPONINI in the last 72 hours. Lab Results  Component Value Date   WBC 9.0 06/23/2010   HGB 7.6 (L) 06/23/2010   HCT 22.6 (L) 06/23/2010   MCV 85.4 06/23/2010   PLT 139 (L) 06/23/2010   No results for input(s): NA, K, CL, CO2, BUN, CREATININE, CALCIUM, PROT, BILITOT, ALKPHOS, ALT, AST, GLUCOSE in the last 168 hours.  Invalid input(s): LABALBU No results  found for: CHOL, HDL, LDLCALC, TRIG No results found for: [facility]   Radiology Studies    No results found.  ECG & Cardiac Imaging    EKG: Atrial Flutter 2:1  TEE: 07/22/16  Summary The left ventricle was not well visualized, grossly normal LV systolic function Pulmonary HTN, possibly severe, indicated by TR velocity of >60m/sec Degenerated mitral valve, reduced leaflet excursion, annuloplasty ring in place; Posterior leaflet appears with limited mobility. MS with MVA 1.19, mean gradient of ; moderate to severe MR   Assessment & Plan    75 yo male with PMH of rheumatic heart disease, coronary artery disease status post PCI and stenting in the past, atrial fibrillation, and COPD who was seen in the office today by Dr. Cornelius Moras and noted to be in atrial fibrillation. Was sent to [facility] for direct admission.   1. 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 Reports a hx of atrial fib in the past after having valve repair in the past. Plans for possible mitral valve replacement next week  with Dr. Cornelius Moras. He is on Eliquis for anticoagulation for PAF and DVT.  -- Will start IV amiodarone with bolus, along with IV heparin. Hold Eliquis --This patients CHA2DS2-VASc Score and unadjusted Ischemic Stroke Rate (% per year) is equal to 2.2 % stroke rate/year from a score of 2 Above score calculated as 1 point each if present [CHF, HTN, DM, Vascular=MI/PAD/Aortic Plaque, Age if 65-74, or Male] Above score calculated as 2 points each if present [Age > 75, or Stroke/TIA/TE]  2. Mitral Valve repair: Followed by Dr. Cornelius Moras, will hold Eliquis in preparation for possible surgery  Carlos. Chronic diastolic HF: Will check BNP, continue with home dose of lasix at this time and wait for BNP. Also order chest x-ray.   Janice Coffin, NP-C Pager [phone] 08/31/2016, 6:37 PM  Patient seen and examined. Agree with assessment and plan.  Carlos Norton is a 75 year old Caucasian male who is followed by Dr. Rhona Leavens in [facility].  He has a history of long-standing rheumatic heart disease, CAD status post PCI and stenting in the past, and has a history of PAF as well as COPD.  He is status post mitral valve repair in 2011 at which time he also had a Maze procedure for atrial fibrillation.  In May 2017 he was started on anticoagulation after developing brachial artery occlusion.  He did recently been found to have progressive mitral regurgitation and has been scheduled for mitral valve replacement later next week.  Over the past month he has had increasing symptoms of shortness of breath with less activity.  He was seen by Dr. Barry Dienes in his office today and was noted to be tachycardic with a heart rate in the 130s.  Admission was recommended and he is now admitted to the cardiology service in light of his decompensation.  Telemetry in the CCU reveals probable atrial flutter with 2:1 block with a ventricular rate at 135.  Exam is notable for respiratory rate at 16-18 with more shallow breaths.  Sclera anicteric.   JVD approximated 7-8 cm.  He has decreased breath sounds at his bases bilaterally.  Rhythm was tachycardic and there was a 2/6 systolic murmur at the apex radiating to the axilla.  It was soft, nontender with positive bowel sounds.  Pulses were 2+.  There was trivial ankle edema.  Neurologic exam was grossly nonfocal.  In light of his atrial flutter with rapid ventricular  rate.  I will initiate an amiodarone bolus and start an infusion initially at 60 mg/h for 6 hours and 30 mg/h.  Eliquis was will be stopped and he will be started on IV heparin therapy.  Laboratory will be checked including chemistry, thyroid function studies, magnesium level, CBC, lipid panel and brain natruretic peptide.  Will continue with home dose Lasix.  Chest x-ray will be obtained.   Lennette Bihari, MD, [facility] 08/31/2016 8:07 PM

## 2016-08-31 NOTE — Patient Instructions (Signed)
Go directly to hospital for admission.

## 2016-09-01 ENCOUNTER — Other Ambulatory Visit: Payer: Self-pay | Admitting: *Deleted

## 2016-09-01 ENCOUNTER — Observation Stay (HOSPITAL_COMMUNITY): Payer: Medicare Other

## 2016-09-01 DIAGNOSIS — K5710 Diverticulosis of small intestine without perforation or abscess without bleeding: Secondary | ICD-10-CM | POA: Diagnosis present

## 2016-09-01 DIAGNOSIS — R578 Other shock: Secondary | ICD-10-CM | POA: Diagnosis not present

## 2016-09-01 DIAGNOSIS — I5033 Acute on chronic diastolic (congestive) heart failure: Secondary | ICD-10-CM

## 2016-09-01 DIAGNOSIS — I2510 Atherosclerotic heart disease of native coronary artery without angina pectoris: Secondary | ICD-10-CM | POA: Diagnosis present

## 2016-09-01 DIAGNOSIS — I340 Nonrheumatic mitral (valve) insufficiency: Secondary | ICD-10-CM | POA: Diagnosis present

## 2016-09-01 DIAGNOSIS — N390 Urinary tract infection, site not specified: Secondary | ICD-10-CM | POA: Diagnosis not present

## 2016-09-01 DIAGNOSIS — K254 Chronic or unspecified gastric ulcer with hemorrhage: Secondary | ICD-10-CM | POA: Diagnosis not present

## 2016-09-01 DIAGNOSIS — B3781 Candidal esophagitis: Secondary | ICD-10-CM | POA: Diagnosis not present

## 2016-09-01 DIAGNOSIS — K5521 Angiodysplasia of colon with hemorrhage: Secondary | ICD-10-CM | POA: Diagnosis not present

## 2016-09-01 DIAGNOSIS — I350 Nonrheumatic aortic (valve) stenosis: Secondary | ICD-10-CM | POA: Diagnosis not present

## 2016-09-01 DIAGNOSIS — I4892 Unspecified atrial flutter: Secondary | ICD-10-CM | POA: Diagnosis not present

## 2016-09-01 DIAGNOSIS — J449 Chronic obstructive pulmonary disease, unspecified: Secondary | ICD-10-CM | POA: Diagnosis not present

## 2016-09-01 DIAGNOSIS — D649 Anemia, unspecified: Secondary | ICD-10-CM | POA: Diagnosis not present

## 2016-09-01 DIAGNOSIS — N179 Acute kidney failure, unspecified: Secondary | ICD-10-CM | POA: Diagnosis not present

## 2016-09-01 DIAGNOSIS — Z7901 Long term (current) use of anticoagulants: Secondary | ICD-10-CM | POA: Diagnosis not present

## 2016-09-01 DIAGNOSIS — K5909 Other constipation: Secondary | ICD-10-CM | POA: Diagnosis present

## 2016-09-01 DIAGNOSIS — Z7982 Long term (current) use of aspirin: Secondary | ICD-10-CM | POA: Diagnosis not present

## 2016-09-01 DIAGNOSIS — N400 Enlarged prostate without lower urinary tract symptoms: Secondary | ICD-10-CM | POA: Diagnosis present

## 2016-09-01 DIAGNOSIS — R630 Anorexia: Secondary | ICD-10-CM | POA: Diagnosis present

## 2016-09-01 DIAGNOSIS — Q2733 Arteriovenous malformation of digestive system vessel: Secondary | ICD-10-CM | POA: Diagnosis not present

## 2016-09-01 DIAGNOSIS — Z01810 Encounter for preprocedural cardiovascular examination: Secondary | ICD-10-CM | POA: Diagnosis not present

## 2016-09-01 DIAGNOSIS — K449 Diaphragmatic hernia without obstruction or gangrene: Secondary | ICD-10-CM | POA: Diagnosis present

## 2016-09-01 DIAGNOSIS — I480 Paroxysmal atrial fibrillation: Secondary | ICD-10-CM | POA: Diagnosis not present

## 2016-09-01 DIAGNOSIS — B9689 Other specified bacterial agents as the cause of diseases classified elsewhere: Secondary | ICD-10-CM | POA: Diagnosis present

## 2016-09-01 DIAGNOSIS — D500 Iron deficiency anemia secondary to blood loss (chronic): Secondary | ICD-10-CM | POA: Diagnosis not present

## 2016-09-01 DIAGNOSIS — E440 Moderate protein-calorie malnutrition: Secondary | ICD-10-CM | POA: Insufficient documentation

## 2016-09-01 DIAGNOSIS — K259 Gastric ulcer, unspecified as acute or chronic, without hemorrhage or perforation: Secondary | ICD-10-CM | POA: Diagnosis present

## 2016-09-01 DIAGNOSIS — I050 Rheumatic mitral stenosis: Secondary | ICD-10-CM | POA: Diagnosis not present

## 2016-09-01 DIAGNOSIS — D509 Iron deficiency anemia, unspecified: Secondary | ICD-10-CM | POA: Diagnosis present

## 2016-09-01 DIAGNOSIS — E871 Hypo-osmolality and hyponatremia: Secondary | ICD-10-CM | POA: Diagnosis not present

## 2016-09-01 LAB — CBC
HCT: 23 % — ABNORMAL LOW (ref 39.0–52.0)
HEMATOCRIT: 22.1 % — AB (ref 39.0–52.0)
HEMOGLOBIN: 6.2 g/dL — AB (ref 13.0–17.0)
Hemoglobin: 6.1 g/dL — CL (ref 13.0–17.0)
MCH: 19.1 pg — AB (ref 26.0–34.0)
MCH: 19.4 pg — ABNORMAL LOW (ref 26.0–34.0)
MCHC: 27 g/dL — AB (ref 30.0–36.0)
MCHC: 27.6 g/dL — ABNORMAL LOW (ref 30.0–36.0)
MCV: 70.8 fL — ABNORMAL LOW (ref 78.0–100.0)
MCV: 70.2 fL — AB (ref 78.0–100.0)
PLATELETS: 285 10*3/uL (ref 150–400)
Platelets: 301 10*3/uL (ref 150–400)
RBC: 3.25 MIL/uL — ABNORMAL LOW (ref 4.22–5.81)
RBC: 3.15 MIL/uL — AB (ref 4.22–5.81)
RDW: 17.3 % — AB (ref 11.5–15.5)
RDW: 17.3 % — ABNORMAL HIGH (ref 11.5–15.5)
WBC: 9.8 10*3/uL (ref 4.0–10.5)
WBC: 9.3 10*3/uL (ref 4.0–10.5)

## 2016-09-01 LAB — BASIC METABOLIC PANEL
ANION GAP: 7 (ref 5–15)
Anion gap: 8 (ref 5–15)
BUN: 25 mg/dL — ABNORMAL HIGH (ref 6–20)
BUN: 25 mg/dL — AB (ref 6–20)
CALCIUM: 8.8 mg/dL — AB (ref 8.9–10.3)
CHLORIDE: 98 mmol/L — AB (ref 101–111)
CHLORIDE: 99 mmol/L — AB (ref 101–111)
CO2: 26 mmol/L (ref 22–32)
CO2: 26 mmol/L (ref 22–32)
CREATININE: 1.13 mg/dL (ref 0.61–1.24)
Calcium: 8.8 mg/dL — ABNORMAL LOW (ref 8.9–10.3)
Creatinine, Ser: 1.17 mg/dL (ref 0.61–1.24)
GFR calc Af Amer: >60 mL/min (ref >60)
GLUCOSE: 97 mg/dL (ref 65–99)
Glucose, Bld: 101 mg/dL — ABNORMAL HIGH (ref 65–99)
POTASSIUM: 3.6 mmol/L (ref 3.5–5.1)
Potassium: 3.7 mmol/L (ref 3.5–5.1)
SODIUM: 132 mmol/L — AB (ref 135–145)
Sodium: 132 mmol/L — ABNORMAL LOW (ref 135–145)

## 2016-09-01 LAB — PREALBUMIN: Prealbumin: 9.6 mg/dL — ABNORMAL LOW (ref 18–38)

## 2016-09-01 LAB — PREPARE RBC (CROSSMATCH)

## 2016-09-01 LAB — APTT: aPTT: 85 seconds — ABNORMAL HIGH (ref 24–36)

## 2016-09-01 LAB — HEPARIN LEVEL (UNFRACTIONATED): Heparin Unfractionated: 1.7 IU/mL — ABNORMAL HIGH (ref 0.30–0.70)

## 2016-09-01 LAB — MAGNESIUM: MAGNESIUM: 2 mg/dL (ref 1.7–2.4)

## 2016-09-01 LAB — TSH: TSH: 1.979 u[IU]/mL (ref 0.350–4.500)

## 2016-09-01 MED ORDER — PEG-KCL-NACL-NASULF-NA ASC-C 100 G PO SOLR
1.0000 | Freq: Once | ORAL | Status: DC
Start: 2016-09-01 — End: 2016-09-01

## 2016-09-01 MED ORDER — DOCUSATE SODIUM 100 MG PO CAPS
100.0000 mg | ORAL_CAPSULE | Freq: Every day | ORAL | Status: DC
  Administered 2016-09-02 – 2016-09-09 (×7): 100 mg via ORAL
  Filled 2016-09-01 (×8): qty 1

## 2016-09-01 MED ORDER — PANTOPRAZOLE SODIUM 40 MG PO TBEC
40.0000 mg | DELAYED_RELEASE_TABLET | Freq: Two times a day (BID) | ORAL | Status: DC
  Administered 2016-09-01 – 2016-09-09 (×17): 40 mg via ORAL
  Filled 2016-09-01 (×18): qty 1

## 2016-09-01 MED ORDER — SODIUM CHLORIDE 0.9 % IV SOLN
Freq: Once | INTRAVENOUS | Status: AC
  Administered 2016-09-01: 10:00:00 via INTRAVENOUS

## 2016-09-01 MED ORDER — PEG-KCL-NACL-NASULF-NA ASC-C 100 G PO SOLR
0.5000 | Freq: Once | ORAL | Status: AC
  Administered 2016-09-01: 100 g via ORAL
  Filled 2016-09-01: qty 1

## 2016-09-01 MED ORDER — PEG-KCL-NACL-NASULF-NA ASC-C 100 G PO SOLR
0.5000 | Freq: Once | ORAL | Status: AC
  Administered 2016-09-02: 100 g via ORAL

## 2016-09-01 MED ORDER — POTASSIUM CHLORIDE CRYS ER 20 MEQ PO TBCR
40.0000 meq | EXTENDED_RELEASE_TABLET | Freq: Once | ORAL | Status: AC
  Administered 2016-09-01: 40 meq via ORAL
  Filled 2016-09-01: qty 2

## 2016-09-01 MED ORDER — SODIUM CHLORIDE 0.9 % IV SOLN
Freq: Once | INTRAVENOUS | Status: AC
  Administered 2016-09-09: 500 mL via INTRAVENOUS

## 2016-09-01 MED ORDER — METOCLOPRAMIDE HCL 5 MG/ML IJ SOLN: 10.0000 mg | Freq: Four times a day (QID) | INTRAMUSCULAR | Status: AC | PRN

## 2016-09-01 MED ORDER — FUROSEMIDE 10 MG/ML IJ SOLN
40.0000 mg | Freq: Three times a day (TID) | INTRAMUSCULAR | Status: DC
  Administered 2016-09-01 – 2016-09-02 (×3): 40 mg via INTRAVENOUS
  Filled 2016-09-01 (×3): qty 4

## 2016-09-01 MED ORDER — SENNOSIDES-DOCUSATE SODIUM 8.6-50 MG PO TABS
1.0000 | ORAL_TABLET | Freq: Once | ORAL | Status: AC
Start: 2016-09-01 — End: 2016-09-01
  Administered 2016-09-01: 1 via ORAL
  Filled 2016-09-01: qty 1

## 2016-09-01 NOTE — Progress Notes (Signed)
      [address]       [name] [id number]             [phone]     CARDIOTHORACIC SURGERY PROGRESS NOTE  Subjective: Feels okay.  Still weak and short of breath.  Receiving blood transfusion.  Objective: Vital signs in last 24 hours: Temp:  [97.9 F (36.6 C)-98.1 F (36.7 C)] 98 F (36.7 C) (10/03 0810) Pulse Rate:  [124-137] 124 (10/03 0857) Cardiac Rhythm: Atrial flutter (10/03 0810) Resp:  [18-32] 28 (10/03 0857) BP: (73-109)/(55-81) 98/74 (10/03 0857) SpO2:  [97 %-100 %] 98 % (10/03 0857) Weight:  [150 lb 9.2 oz (68.3 kg)-154 lb (69.9 kg)] 150 lb 9.2 oz (68.3 kg) (10/02 1731)  Physical Exam:  Rhythm:   Aflutter  Breath sounds: Diminished at bases  Heart sounds:  Tachycardic, regular  Incisions:  n/a  Abdomen:  soft  Extremities:  warm   Intake/Output from previous day: 10/02 0701 - 10/03 0700 In: 970 [P.O.:360; I.V.:610] Out: 450 [Urine:450] Intake/Output this shift: Total I/O In: 120 [P.O.:120] Out: 100 [Urine:100]  Lab Results:  Recent Labs  09/01/16 0243 09/01/16 0717  WBC 9.8 9.3  HGB 6.2* 6.1*  HCT 23.0* 22.1*  PLT 285 301   BMET:  Recent Labs  09/01/16 0243 09/01/16 0823  NA 132* 132*  K 3.7 3.6  CL 98* 99*  CO2 26 26  GLUCOSE 101* 97  BUN 25* 25*  CREATININE 1.13 1.17  CALCIUM 8.8* 8.8*    CBG (last 3)  No results for input(s): GLUCAP in the last 72 hours. PT/INR:  No results for input(s): LABPROT, INR in the last 72 hours.  CXR:  PORTABLE CHEST 1 VIEW  COMPARISON:  08/31/2016.  FINDINGS: Cardiomegaly with diffuse bilateral pulmonary infiltrates and bilateral pleural effusions. 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 Exam stable prior exam. No pneumothorax.  IMPRESSION: Persistent congestive heart failure with pulmonary edema bilateral pleural effusions again noted. No significant interim change.   Electronically Signed   By: Maisie Fushomas   Register   On: 09/01/2016 07:02  Assessment/Plan:  Findings of severe iron deficient anemia suggestive of occult GI blood loss. Await input from GI team. Agree with plans as outlined by Dr. Shirlee LatchMcLean. We will postpone making any definitive plans for surgery until this has been sorted out further. We'll continue to follow.  Purcell Nailslarence H Zniyah Midkiff, MD 09/01/2016 10:36 AM

## 2016-09-01 NOTE — Progress Notes (Addendum)
Patient ID: ERMAL HABERER, male   DOB: July 06, 1941, 75 y.o.   MRN: [id number]   75 yo with history of rheumatic heart disease, CAD s/p PCI LCx/RCA, paroxysmal atrial fibrillation, COPD, and prior GI bleeding was admitted with atrial flutter/RVR and noted to be markedly anemic.   Patient had minimally invasive MV repair + Maze in 2011, this was complicated by GI bleeding (source uncertain, do not have records).  He was eventually taken off anticoagulation.  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  Since that time, he has been on Eliquis 2.5 bid + ASA 81 daily.  No melena or BRBPR noted.  He developed progressive exertional dyspnea over the last 2 months.  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  He was referred to Dr Cornelius Moras for mitral valve replacement.  Plan for MVR next week.  Seen at CVTS office 10/2, noted to be tachycardic around 150 and more short of breath, directly admitted.  At [facility], noted to be in atrial flutter with RVR.  CBC returned with hgb 6.2.  He was initially started on amiodarone gtt + heparin gtt, heparin stopped with low hemoglobin.   This morning, pt remains on amiodarone gtt with HR 120s.  SBP 90s.  He is comfortable at rest, short of breath with exertion.  Repeat CBC with hgb 6.1.  No overt bleeding overnight.   Echo/TEE at [facility]: EF 55-60%, repaired mitral valve with mild-moderate MR and severe stenosis mean gradient 14 mmHg.    Scheduled Meds: . sodium chloride   Intravenous Once  . sodium chloride   Intravenous Once  . atorvastatin  20 mg Oral q1800  . feeding supplement (ENSURE ENLIVE)  237 mL Oral BID BM  . furosemide  40 mg Intravenous Q8H  . mometasone-formoterol  2 puff Inhalation BID  . pantoprazole  40 mg Oral BID  . potassium chloride  40 mEq Oral Once  . tiotropium  18 mcg Inhalation Daily   Continuous Infusions: .  amiodarone 30 mg/hr (09/01/16 0544)   PRN Meds:.acetaminophen, ondansetron (ZOFRAN) IV   Vitals:   08/31/16 2300 08/31/16 2357 09/01/16 0415 09/01/16 0528  BP: 94/74 94/65 109/73 95/69  Pulse: (!) 125 (!) 124 (!) 126   Resp:  (!) 21 (!) 31 (!) 32  Temp:  98.1 F (36.7 C) 98 F (36.7 C)   TempSrc:  Oral Oral   SpO2: 99% 98% 100% 100%  Weight:      Height:        Intake/Output Summary (Last 24 hours) at 09/01/16 0811 Last data filed at 09/01/16 0600  Gross per 24 hour  Intake           970.02 ml  Output              450 ml  Net           520.02 ml    LABS: Basic Metabolic Panel:  Recent Labs  40/98/11 1934 09/01/16 0243  NA 136 132*  K 3.9 3.7  CL 99* 98*  CO2 28 26  GLUCOSE 131* 101*  BUN 27* 25*  CREATININE 1.24 1.13  CALCIUM 9.1 8.8*  MG 2.0 2.0   Liver Function Tests:  Recent Labs  08/31/16 1934  AST 42*  ALT 28  ALKPHOS 138*  BILITOT 0.7  PROT 6.3*  ALBUMIN 3.2*   No results for  input(s): LIPASE, AMYLASE in the last 72 hours. CBC:  Recent Labs  09/01/16 0243 09/01/16 0717  WBC 9.8 9.3  HGB 6.2* 6.1*  HCT 23.0* 22.1*  MCV 70.8* 70.2*  PLT 285 301   Cardiac Enzymes: No results for input(s): CKTOTAL, CKMB, CKMBINDEX, TROPONINI in the last 72 hours. BNP: Invalid input(s): POCBNP D-Dimer: No results for input(s): DDIMER in the last 72 hours. Hemoglobin A1C: No results for input(s): HGBA1C in the last 72 hours. Fasting Lipid Panel: No results for input(s): CHOL, HDL, LDLCALC, TRIG, CHOLHDL, LDLDIRECT in the last 72 hours. Thyroid Function Tests:  Recent Labs  09/01/16 0243  TSH 1.979   Anemia Panel: No results for input(s): VITAMINB12, FOLATE, FERRITIN, TIBC, IRON, RETICCTPCT in the last 72 hours.  RADIOLOGY: Dg Chest Port 1 View  Result Date: 09/01/2016 CLINICAL DATA:  Congestive heart failure. Shortness of breath . Congestive heart failure. Shortness breath. EXAM: PORTABLE CHEST 1 VIEW COMPARISON:  08/31/2016. FINDINGS:  Cardiomegaly with diffuse bilateral pulmonary infiltrates and bilateral pleural effusions. 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 Exam stable prior exam. No pneumothorax. IMPRESSION: Persistent congestive heart failure with pulmonary edema bilateral pleural effusions again noted. No significant interim change. Electronically Signed   By: Maisie Fus  Register   On: 09/01/2016 07:02   Dg Chest Port 1 View  Result Date: 08/31/2016 CLINICAL DATA:  Atrial fibrillation, dyspnea EXAM: PORTABLE CHEST 1 VIEW COMPARISON:  04/09/2016 FINDINGS: There is cardiomegaly with pulmonary vascular redistribution consistent with CHF. Opacities are noted at the left lung base and periphery of the left lower thorax consistent with moderate layering effusion. Ovoid density in in the right mid thorax may represent fluid in the major fissure. Aortic atherosclerosis without aneurysm. No acute osseous abnormality. IMPRESSION: Cardiomegaly with CHF and bilateral pleural effusions left greater than right. Electronically Signed   By: Tollie Eth M.D.   On: 08/31/2016 23:42    PHYSICAL EXAM General: NAD Neck: JVP 12 cm, no thyromegaly or thyroid nodule.  Lungs: Dependent crackles.  CV: Nondisplaced PMI.  Heart tachy, regular S1/S2, no S3/S4, 1/6 SEM RUSB.  No peripheral edema.   Abdomen: Soft, nontender, no hepatosplenomegaly, no distention.  Neurologic: Alert and oriented x 3.  Psych: Normal affect. Extremities: No clubbing or cyanosis.   TELEMETRY: Reviewed telemetry pt in atrial flutter 120s  ASSESSMENT AND PLAN: 75 yo with history of rheumatic heart disease, CAD s/p PCI LCx/RCA, paroxysmal atrial fibrillation, COPD, and prior GI bleeding was admitted with atrial flutter/RVR and noted to be markedly anemic.  1. Acute on chronic diastolic CHF: Preserved EF on echo from [facility] with severe mitral stenosis.  [place] with elevated PCWP and PA pressure.  Patient is  volume overloaded on exam in setting of severe mitral stenosis and atrial flutter with RVR.  The rapid rate impairs the already difficult LV filling.  Ultimately, needs to get out of atrial flutter.  However, our ability to cardiovert is curtailed by severe anemia and concern for GI bleeding.  - Control HR as much as possible with amiodarone gtt for now.   - Lasix 40 mg IV every 8 hrs start now.  2. Mitral valve disease: Rheumatic MV, s/p MV repair in 2011.  Most recent TEE at [facility] showed mild to moderate MR with severe mitral stenosis, mean gradient 14.  Needs mitral valve replacement, but needs to be stabilized first.  3. Atrial flutter: With RVR.  Will tolerate poorly  with severe mitral stenosis.  HR down to 120s this morning, still in flutter, now on amiodarone.  It will be dangerous to cardiovert him until he can be anticoagulated.  He had a recent cardioembolic event (5/17 embolus to subclavian).  - Continue amiodarone gtt. - GI workup for bleeding.  Hopefully can anticoagulate him soon, allowing us to do a TEE-guided DCCV.  4. Anemia: Severe, hgb 6.1 today. Last hgb back in 7/17 was in the 9 range.  He has a history of GI bleeding in 2011 around the time of his prior cardiac surgery, I do not know the source.  He denies melena/BRBPR.  - Transfuse 2 units PRBCs with Lasix.  - Check Fe stores.  - Protonix.  - GI consult.   40 minutes critical care time.   Marca AnconaDalton Zikeria Keough 09/01/2016 8:24 AM

## 2016-09-01 NOTE — Progress Notes (Signed)
Patient hemoglobin 6.2 this morning.Patient remains on iv heparin no active bleeding noted.Call placed to Dr.Henderson.New orders received and carried out.

## 2016-09-01 NOTE — Progress Notes (Signed)
Initial Nutrition Assessment  DOCUMENTATION CODES:   Non-severe (moderate) malnutrition in context of chronic illness  INTERVENTION:   -D/c Ensure Enlive po BID, each supplement provides 350 kcal and 20 grams of protein, due to poor acceptance -Snacks TID  NUTRITION DIAGNOSIS:   Malnutrition related to chronic illness as evidenced by mild depletion of body fat, moderate depletion of body fat, mild depletion of muscle mass, moderate depletions of muscle mass, percent weight loss.  GOAL:   Patient will meet greater than or equal to 90% of their needs  MONITOR:   PO intake, Supplement acceptance, Labs, Weight trends, Skin, I & O's  REASON FOR ASSESSMENT:   Malnutrition Screening Tool    ASSESSMENT:   75 yo male with PMH of rheumatic heart disease, coronary artery disease status post PCI and stenting in the past, atrial fibrillation, and COPD who was seen in the office today by Dr. Cornelius Moraswen and noted to be in atrial fibrillation. [facility]   Pt admitted with atrial flutter and CHF.   Spoke with pt at bedside, who reports a general decline in health over the past 1-2 months. He shares that he has been a lot weaker than normal, secondary to respiratory status. Pt also shares that he has been eating less due to a decreased appetite. Typical intake consists of AM: fruit and and raisin toast with medications, lunch: sandwich, dinner: meat, starch, and vegetable. Pt consumed less than 25% of breakfast tray this AM, but shares "I'm not a big breakfast eater, anyways".   Reviewed wt hx; pt estimates he has lost 10# over the past past 1-2 months. Wt hx reveals a 15# (9%) wt loss over the past month, which is significant for time frame.   Nutrition-Focused physical exam completed. Findings are mild to moderate fat depletion, mild to moderate muscle depletion, and no edema.   Pt refused offer of Ensure supplement, as well as alternatives such as Boost Breeze. He  requested grape juice during RD visit. Case discussed with RN, who reports that pt has had a lot of social stressors, including a daughter with pancreatic cancer who recently entered hospice care and his wife, who was recently diagnosed with breast cancer s/p mastectomy. Provided emotional support to pt. RD will offer snacks between meals in attempt to optimize PO intake. Discussed importance of good PO intake to support healing.   Labs reviewed: Na: 132.   Diet Order:  Diet Heart Room service appropriate? Yes; Fluid consistency: Thin  Skin:  Reviewed, no issues  Last BM:  08/30/16  Height:   Ht Readings from Last 1 Encounters:  08/31/16 5\' 11"  (1.803 m)    Weight:   Wt Readings from Last 1 Encounters:  08/31/16 150 lb 9.2 oz (68.3 kg)    Ideal Body Weight:  78.2 kg  BMI:  Body mass index is 21 kg/m.  Estimated Nutritional Needs:   Kcal:  2000-2200  Protein:  100-115 grams  Fluid:  2.0-2.2 L  EDUCATION NEEDS:   Education needs addressed  Janiaya Ryser A. Mayford KnifeWilliams, RD, LDN, CDE Pager: [phone] After hours Pager: [phone]

## 2016-09-01 NOTE — Progress Notes (Signed)
Amiodarone Drug - Drug Interaction Consult Note  Recommendations: No significant issues noted. Monitor for muscle pain/weakness on atorvastatin.  Amiodarone is metabolized by the cytochrome P450 system and therefore has the potential to cause many drug interactions. Amiodarone has an average plasma half-life of 50 days (range 20 to 100 days).   There is potential for drug interactions to occur several weeks or months after stopping treatment and the onset of drug interactions may be slow after initiating amiodarone.   [x]  Statins: Increased risk of myopathy. Simvastatin- restrict dose to 20mg  daily.  Other statins: counsel patients to report any muscle pain or weakness  immediately.  -Atorvastatin  []  Anticoagulants: Amiodarone can increase anticoagulant effect. Consider warfarin dose reduction. Patients should be monitored closely and the dose of anticoagulant altered accordingly, remembering that amiodarone levels take several weeks to stabilize.  []  Antiepileptics: Amiodarone can increase plasma concentration of phenytoin, the dose should be reduced. Note that small changes in phenytoin dose can result in large changes in levels. Monitor patient and counsel on signs of toxicity.  []  Beta blockers: increased risk of bradycardia, AV block and myocardial depression. Sotalol - avoid concomitant use.  -Toprol PTA  []   Calcium channel blockers (diltiazem and verapamil): increased risk of bradycardia, AV block and myocardial depression.  []   Cyclosporine: Amiodarone increases levels of cyclosporine. Reduced dose of cyclosporine is recommended.  []  Digoxin dose should be halved when amiodarone is started.  []  Diuretics: increased risk of cardiotoxicity if hypokalemia occurs.  []  Oral hypoglycemic agents (glyburide, glipizide, glimepiride): increased risk of hypoglycemia. Patient's glucose levels should be monitored closely when initiating amiodarone therapy.   []  Drugs that prolong the QT  interval:  Torsades de pointes risk may be increased with concurrent use - avoid if possible.  Monitor QTc, also keep magnesium/potassium WNL if concurrent therapy can't be avoided. Marland Kitchen. Antibiotics: e.g. fluoroquinolones, erythromycin. . Antiarrhythmics: e.g. quinidine, procainamide, disopyramide, sotalol. . Antipsychotics: e.g. phenothiazines, haloperidol.  . Lithium, tricyclic antidepressants, and methadone. Thank You,  Babs BertinBaird, Maxxwell Edgett P  09/01/2016 9:05 AM

## 2016-09-01 NOTE — Progress Notes (Signed)
eLink Physician-Brief Progress Note Patient Name: Carlos Norton DOB: 1941-05-11 MRN: [id number]   Date of Service  09/01/2016  HPI/Events of Note  Notified of need for DVT prophylaxis. Heparin stopped d/t presumed GI blood loss. GI w/u in progress. Bilateral LE Duplex US pending.   eICU Interventions  Will order: 1. Bilateral SCD's if Duplex US is negative for DVT.     Intervention Category Intermediate Interventions: Best-practice therapies (e.g. DVT, beta blocker, etc.)  Shuree Brossart Eugene 09/01/2016, 3:29 PM

## 2016-09-01 NOTE — Consult Note (Signed)
[facility] Consult: 9:44 AM 09/01/2016  LOS: 1 day    Referring Provider: Dr Aundra Dubin  Primary Care Physician:  Rubie Maid, MD Primary Gastroenterologist:  Althia Forts.  Previous GI MDs in [facility] [place].     Reason for Consultation:  anemia   HPI: Carlos Norton is a 75 y.o. male.  Hx MV stenosis, CAD and A fib.  S/p 2011 Maze procedure, MV repair.  Unfortunately he has progressed to severe symptomatic mitral stenosis, mitral regurgitation, and acute exacerbation of chronic diastolic congestive heart failure.  Dr Roxy Manns of [facility] has tentative plans for redo mitral valve replacement later next week.  Previous 2011 coronary artery stenting.  Had not been on Coumadin due to hx of severe anemia.  However after brachial embolectomy in 03/2016, Eliquis was inititated.  Also takes ASA.  04/2016 laser prostatectomy.   Had colonoscopy and EGD at [facility] in 2011 for overt GIB (hematochezia). They did not find a source but he required 14 PRBCs over course of admission.  The bleeding and anemia resolved once Coumadin and Plavix eliminated.  This took place prior to the MV repair/mazie procedure.   Sent for direct admit for decompensated diastolic heart failure in setting of a fib after 10/2 OV with Dr Roxy Manns.  In the last 4 weeks pt's DOE has progressed to resting dyspnea.  + anorexia and 20# weight loss.   CHF confirmed by CXR.   Hgb 03/2016: 11.5, MCV 93.  Today it is 6.2 and 6.1.  MCV 70  BNP 994.   + mild AKI: 27/1.2.   Elevated alk phos, 138.  AST/ALT 42/28.  T bili normal.   Heparin drip has replaced Eliquis.  PRBCs x 2 ordered.    In addition to anorexia, pt does not have reflux sxs or dysphagia.  Long term issues with constipation that he treats with daily colace.  Last BM was 2 days ago.  Stools formed and dark but not  black/bloody/tarry.  No abd pain. No NSAIDs, no ETOH.  Significant social issues include his wife's recent mastectomy, a daughter with pancreatic cancer who just entered hospice and granddaughter's wedding set for this coming weekend which pt will not be able to attend.      Past Medical History:  Diagnosis Date  . Atrial fibrillation ([place])   . BPH (benign prostatic hyperplasia) 08/15/2012  . 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 Chronic diastolic (congestive) heart failure   . COPD (chronic obstructive pulmonary disease) ([place]) 08/15/2012  . Coronary artery disease 12/19/2009   PCI and stenting of RCA and OM branch  . GI bleeding 2011   while patient on coumadin and Plavix - no source identified  . Mitral stenosis   . Pulmonary hypertension   . Rheumatic heart disease 08/15/2012  . S/P Maze operation for atrial fibrillation 06/18/2010   Left side lesion set using cryothermy with oversewing of LA appendage  . 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 BRACHIAL EMBOLECTOMY Left 04/09/2016  . GREEN LIGHT LASER TURP (TRANSURETHRAL RESECTION OF PROSTATE  05/07/2016  . MAZE  06/18/2010  . MITRAL VALVE REPAIR  06/18/2010  . PERCUTANEOUS CORONARY STENT INTERVENTION (PCI-S)  11/2009   RCA and LCx    Prior to Admission medications   Medication Sig Start Date End Date Taking? Authorizing Provider  apixaban (ELIQUIS) 2.5 MG TABS tablet Take 2.5 mg by mouth 2 (two) times daily.   Yes Historical Provider, MD  aspirin EC 81 MG tablet Take 81 mg by mouth daily.   Yes Historical Provider, MD  atorvastatin (LIPITOR) 20 MG tablet Take 20 mg by mouth daily at 6 PM.  07/30/12  Yes Historical Provider, MD  budesonide-formoterol (SYMBICORT) 80-4.5 MCG/ACT inhaler Inhale 2 puffs into the lungs 2  (two) times daily.   Yes Historical Provider, MD  Calcium Carbonate-Vitamin D3 (CALCIUM 600/VITAMIN D) 600-400 MG-UNIT TABS Take 1 tablet by mouth daily.   Yes Historical Provider, MD  furosemide (LASIX) 20 MG tablet Take 20 mg by mouth daily.  07/30/12  Yes Historical Provider, MD  KLOR-CON 10 10 MEQ tablet 10 mEq every other day.  08/13/12  Yes Historical Provider, MD  metoprolol succinate (TOPROL-XL) 25 MG 24 hr tablet Take 25 mg by mouth daily.   Yes Historical Provider, MD  pantoprazole (PROTONIX) 40 MG tablet 40 mg daily. TAKE AT 0600 08/03/12  Yes Historical Provider, MD  sildenafil (VIAGRA) 100 MG tablet Take 100 mg by mouth daily as needed for erectile dysfunction.   Yes Historical Provider, MD  SPIRIVA HANDIHALER 18 MCG inhalation capsule Place 18 mcg into inhaler and inhale daily.  08/13/12  Yes Historical Provider, MD    Scheduled Meds: . sodium chloride   Intravenous Once  . sodium chloride   Intravenous Once  . atorvastatin  20 mg Oral q1800  . feeding supplement (ENSURE ENLIVE)  237 mL Oral BID BM  . furosemide  40 mg Intravenous Q8H  . mometasone-formoterol  2 puff Inhalation BID  . pantoprazole  40 mg Oral BID  . potassium chloride  40 mEq Oral Once  . tiotropium  18 mcg Inhalation Daily   Infusions: . amiodarone 30 mg/hr (09/01/16 0544)   PRN Meds: acetaminophen, ondansetron (ZOFRAN) IV   Allergies as of 08/31/2016  . (No Known Allergies)    History reviewed. No pertinent family history.  Social History   Social History  . Marital status: Married    Spouse name: N/A  . Number of children: N/A  . Years of education: N/A   Occupational History  . Not on file.   Social History Main Topics  . Smoking status: Former Research scientist (life sciences)  . Smokeless tobacco: Never Used  . Alcohol use No  . Drug use: No  . Sexual activity: Not on file   Other Topics Concern  . Not on file   Social History Narrative  . No narrative on file    REVIEW OF SYSTEMS: Constitutional:   Tired, fatigue.  ENT:  No nose bleeds Pulm:  No cough.  + DOE CV:  No palpitations, no LE edema. No chest pain GU:  No hematuria, no frequency.  No blood in urine GI:  Per HPI Heme:  No unusual bleeding.  Stable occurrence of purpura on UE.   Transfusions:  Per HPI.   Neuro:  No headaches, no peripheral tingling or numbness Derm:  No itching, no rash  or sores.  Endocrine:  No sweats or chills.  No polyuria or dysuria Immunization:  Not queried Travel:  None beyond local counties in last few months.    PHYSICAL EXAM: Vital signs in last 24 hours: Vitals:   09/01/16 0810 09/01/16 0857  BP: 98/74 98/74  Pulse: (!) 124 (!) 124  Resp: (!) 29 (!) 28  Temp: 98 F (36.7 C)    Wt Readings from Last 3 Encounters:  08/31/16 68.3 kg (150 lb 9.2 oz)  08/31/16 69.9 kg (154 lb)  07/28/16 74.8 kg (165 lb)    General: pleasant, somewhat frail but not severely ill appearing elderly WM.  Looks better than advertised.  Head:  No asymmetry or swelling  Eyes:  No icterus or pallor Ears:  Not HOH  Nose:  No discharge or congestion Mouth:  Clear and moist oral MM and tongue midline.  Dentures in place Neck:  No mass, no JVD.  No TMG Lungs:  Fine crackles in bases.  No dyspnea with speech.  No cough   Heart: RRR.  Sinus tachy to 120s on monitor.  Abdomen:  Soft, NT, ND.  No mass or HSM.  No bruits.  Active BS.  No hernias.   Rectal: large volume of formed stool in rectum, no masses.  Stool is orange/light to medium brown and trace FOBT+.     Musc/Skeltl: kyphosis, no gross limb deformities or joint erythema.   Extremities:  No CCE  Neurologic:  Alert.  Oriented x 3.  No tremor or limb weakness.   Skin:  No rash or sores.  Some UE purpura bil.   Tattoos:  none Nodes:  No cervical adenopathy.    Psych:  Pleasant, in good spirits, calm.    Intake/Output from previous day: 10/02 0701 - 10/03 0700 In: 970 [P.O.:360; I.V.:610] Out: 450 [Urine:450] Intake/Output this shift: Total I/O In: -    Out: 100 [Urine:100]  LAB RESULTS:  Recent Labs  09/01/16 0243 09/01/16 0717  WBC 9.8 9.3  HGB 6.2* 6.1*  HCT 23.0* 22.1*  PLT 285 301   BMET Lab Results  Component Value Date   NA 132 (L) 09/01/2016   NA 132 (L) 09/01/2016   NA 136 08/31/2016   K 3.6 09/01/2016   K 3.7 09/01/2016   K 3.9 08/31/2016   CL 99 (L) 09/01/2016   CL 98 (L) 09/01/2016   CL 99 (L) 08/31/2016   CO2 26 09/01/2016   CO2 26 09/01/2016   CO2 28 08/31/2016   GLUCOSE 97 09/01/2016   GLUCOSE 101 (H) 09/01/2016   GLUCOSE 131 (H) 08/31/2016   BUN 25 (H) 09/01/2016   BUN 25 (H) 09/01/2016   BUN 27 (H) 08/31/2016   CREATININE 1.17 09/01/2016   CREATININE 1.13 09/01/2016   CREATININE 1.24 08/31/2016   CALCIUM 8.8 (L) 09/01/2016   CALCIUM 8.8 (L) 09/01/2016   CALCIUM 9.1 08/31/2016   LFT  Recent Labs  08/31/16 1934  PROT 6.3*  ALBUMIN 3.2*  AST 42*  ALT 28  ALKPHOS 138*  BILITOT 0.7   PT/INR Lab Results  Component Value Date   INR 1.45 06/18/2010   INR 1.08 06/14/2010   Hepatitis Panel No results for input(s): HEPBSAG, HCVAB, HEPAIGM, HEPBIGM in the last 72 hours. C-Diff No components found for: CDIFF Lipase  No results found for: LIPASE  Drugs of Abuse  No results found for: LABOPIA, COCAINSCRNUR, LABBENZ, AMPHETMU, THCU, LABBARB   RADIOLOGY STUDIES: Dg Chest Port 1 View  Result Date:  09/01/2016 CLINICAL DATA:  Congestive heart failure. Shortness of breath . Congestive heart failure. Shortness breath. EXAM: PORTABLE CHEST 1 VIEW COMPARISON:  08/31/2016. FINDINGS: Cardiomegaly with diffuse bilateral pulmonary infiltrates and bilateral pleural effusions. 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 Exam stable prior exam. No pneumothorax. IMPRESSION: Persistent congestive heart failure with pulmonary edema bilateral pleural effusions again noted. No significant interim change. Electronically Signed   By: Marcello Moores  Register   On:  09/01/2016 07:02   Dg Chest Port 1 View  Result Date: 08/31/2016 CLINICAL DATA:  Atrial fibrillation, dyspnea EXAM: PORTABLE CHEST 1 VIEW COMPARISON:  04/09/2016 FINDINGS: There is cardiomegaly with pulmonary vascular redistribution consistent with CHF. Opacities are noted at the left lung base and periphery of the left lower thorax consistent with moderate layering effusion. Ovoid density in in the right mid thorax may represent fluid in the major fissure. Aortic atherosclerosis without aneurysm. No acute osseous abnormality. IMPRESSION: Cardiomegaly with CHF and bilateral pleural effusions left greater than right. Electronically Signed   By: Ashley Royalty M.D.   On: 08/31/2016 23:42     IMPRESSION:   *  Anemia, borderline microcytic.  FOBT + but not grossly bleeding. Previous acute GI bleeding in 2011 in setting of plavix, coumadin.  No source ID'd on EGD or Colonoscopy.   Takes daily protonix so ulcers, gastritis less likely.  More likely source is AVMs.  Neoplasia also a possiblity  *  Severe MV stenosis, regurge, and diastolic heart failure.  All sxs exacerbated by severe anemia and a fib.  sxs improved with amiodarone, blood and diuretic.  Had patent stents on cardiac cath in [facility] a few months ago.    *  PAF, amiodarone drip initiated and currently in sinus tach.   *  Hyponatremia.     PLAN:     *  Needs EGD and colonoscopy but GI needs to discuss with surgeon and cardiologist regarding safety for sedation.    *  Added colace and single dose of Senakot for his chronic constipation.  Not sure he needs the PPI upped to BID as has been initiated but will leave the dosing in place for now.    Azucena Freed  09/01/2016, 9:44 AM Pager: [phone]

## 2016-09-01 NOTE — Progress Notes (Addendum)
ANTICOAGULATION CONSULT NOTE - Follow Up Consult  Pharmacy Consult for Heparin (while apixaban on hold) Indication: atrial fibrillation  No Known Allergies  Patient Measurements: Height: 5\' 11"[id number]$  (180.3 cm) Weight: 150 lb 9.2 oz (68.3 kg) IBW/kg (Calculated) : 75.3  Vital Signs: Temp: 98 F (36.7 C) (10/03 0415) Temp Source: Oral (10/03 0415) BP: 109/73 (10/03 0415) Pulse Rate: 126 (10/03 0415)  Labs:  Recent Labs  08/31/16 1934 09/01/16 0243  APTT 41* 85*  HEPARINUNFRC >2.20*  --   CREATININE 1.24 1.13    Estimated Creatinine Clearance: 56.2 mL/min (by C-G formula based on SCr of 1.13 mg/dL).   Assessment: [facility] after finding pt in afib during office visit, holding apixaban and using heparin, aPTT is therapeutic x 1, using aPTT to dose for now given apixaban influence on anti-Xa levels. Hgb this AM is 6.2, unsure of baseline, this is the first inpatient level we have. No obvious bleeding per RN-she has paged MD  Goal of Therapy:  Heparin level 0.3-0.7 units/ml aPTT 66-102 seconds Monitor platelets by anticoagulation protocol: Yes   Plan:  -Cont heparin 1000 units/hr -1200 aPTT/HL -Watch Hgb  Abran DukeLedford, Kanitra Purifoy 09/01/2016,4:22 AM

## 2016-09-01 NOTE — Progress Notes (Signed)
   Remains in A flutter RVR. Remains on Amio drip. No anticoagulation for now.   GI consult appreciated. Receiving 2nd unit of blood. Watch for volume overload.   Lavoris Sparling NP-C  2:35 PM

## 2016-09-02 ENCOUNTER — Inpatient Hospital Stay (HOSPITAL_COMMUNITY): Payer: Medicare Other | Admitting: Certified Registered Nurse Anesthetist

## 2016-09-02 ENCOUNTER — Encounter (HOSPITAL_COMMUNITY)
Admission: AD | Disposition: A | Discharge status: Home / Self Care | Payer: Self-pay | Source: Ambulatory Visit | Attending: Cardiology

## 2016-09-02 ENCOUNTER — Inpatient Hospital Stay (HOSPITAL_COMMUNITY): Payer: Medicare Other

## 2016-09-02 ENCOUNTER — Encounter (HOSPITAL_COMMUNITY): Payer: Self-pay | Admitting: *Deleted

## 2016-09-02 DIAGNOSIS — I050 Rheumatic mitral stenosis: Secondary | ICD-10-CM

## 2016-09-02 DIAGNOSIS — D500 Iron deficiency anemia secondary to blood loss (chronic): Secondary | ICD-10-CM

## 2016-09-02 DIAGNOSIS — I350 Nonrheumatic aortic (valve) stenosis: Secondary | ICD-10-CM

## 2016-09-02 HISTORY — PX: ENTEROSCOPY: SHX5533

## 2016-09-02 LAB — TYPE AND SCREEN
ABO/RH(D): A POS
Antibody Screen: NEGATIVE
UNIT DIVISION: 0
Unit division: 0

## 2016-09-02 LAB — BASIC METABOLIC PANEL
Anion gap: 11 (ref 5–15)
BUN: 20 mg/dL (ref 6–20)
CHLORIDE: 96 mmol/L — AB (ref 101–111)
CO2: 27 mmol/L (ref 22–32)
CREATININE: 1.23 mg/dL (ref 0.61–1.24)
Calcium: 8.8 mg/dL — ABNORMAL LOW (ref 8.9–10.3)
GFR calc non Af Amer: 56 mL/min — ABNORMAL LOW (ref >60)
Glucose, Bld: 108 mg/dL — ABNORMAL HIGH (ref 65–99)
POTASSIUM: 3.3 mmol/L — AB (ref 3.5–5.1)
SODIUM: 134 mmol/L — AB (ref 135–145)

## 2016-09-02 LAB — CBC
HEMATOCRIT: 30.5 % — AB (ref 39.0–52.0)
HEMOGLOBIN: 8.7 g/dL — AB (ref 13.0–17.0)
MCH: 20.7 pg — ABNORMAL LOW (ref 26.0–34.0)
MCHC: 28.5 g/dL — AB (ref 30.0–36.0)
MCV: 72.6 fL — ABNORMAL LOW (ref 78.0–100.0)
Platelets: 317 10*3/uL (ref 150–400)
RBC: 4.2 MIL/uL — AB (ref 4.22–5.81)
RDW: 17.1 % — ABNORMAL HIGH (ref 11.5–15.5)
WBC: 10.7 10*3/uL — ABNORMAL HIGH (ref 4.0–10.5)

## 2016-09-02 LAB — FERRITIN: FERRITIN: 39 ng/mL (ref 24–336)

## 2016-09-02 LAB — IRON AND TIBC
IRON: 15 ug/dL — AB (ref 45–182)
SATURATION RATIOS: 4 % — AB (ref 17.9–39.5)
TIBC: 419 ug/dL (ref 250–450)
UIBC: 404 ug/dL

## 2016-09-02 SURGERY — ENTEROSCOPY
Anesthesia: Monitor Anesthesia Care

## 2016-09-02 MED ORDER — PEG-KCL-NACL-NASULF-NA ASC-C 100 G PO SOLR
0.5000 | Freq: Once | ORAL | Status: AC
  Administered 2016-09-03: 100 g via ORAL

## 2016-09-02 MED ORDER — PHENYLEPHRINE 40 MCG/ML (10ML) SYRINGE FOR IV PUSH (FOR BLOOD PRESSURE SUPPORT)
PREFILLED_SYRINGE | INTRAVENOUS | Status: DC | PRN
  Administered 2016-09-02 (×2): 80 ug via INTRAVENOUS

## 2016-09-02 MED ORDER — BUTAMBEN-TETRACAINE-BENZOCAINE 2-2-14 % EX AERO
INHALATION_SPRAY | CUTANEOUS | Status: DC | PRN
  Administered 2016-09-02: 2 via TOPICAL

## 2016-09-02 MED ORDER — PHENYLEPHRINE HCL 10 MG/ML IJ SOLN
INTRAVENOUS | Status: DC | PRN
  Administered 2016-09-02: 25 ug/min via INTRAVENOUS

## 2016-09-02 MED ORDER — FERUMOXYTOL INJECTION 510 MG/17 ML
510.0000 mg | Freq: Once | INTRAVENOUS | Status: AC
  Administered 2016-09-02: 510 mg via INTRAVENOUS
  Filled 2016-09-02 (×2): qty 17

## 2016-09-02 MED ORDER — FUROSEMIDE 10 MG/ML IJ SOLN
80.0000 mg | Freq: Two times a day (BID) | INTRAMUSCULAR | Status: DC
  Administered 2016-09-02 – 2016-09-04 (×4): 80 mg via INTRAVENOUS
  Filled 2016-09-02 (×5): qty 8

## 2016-09-02 MED ORDER — PROPOFOL 500 MG/50ML IV EMUL
INTRAVENOUS | Status: DC | PRN
  Administered 2016-09-02: 75 ug/kg/min via INTRAVENOUS

## 2016-09-02 MED ORDER — LIDOCAINE 2% (20 MG/ML) 5 ML SYRINGE
INTRAMUSCULAR | Status: DC | PRN
  Administered 2016-09-02: 60 mg via INTRAVENOUS

## 2016-09-02 MED ORDER — PEG-KCL-NACL-NASULF-NA ASC-C 100 G PO SOLR
0.5000 | Freq: Once | ORAL | Status: AC
  Administered 2016-09-02: 100 g via ORAL
  Filled 2016-09-02: qty 1

## 2016-09-02 MED ORDER — POTASSIUM CHLORIDE CRYS ER 20 MEQ PO TBCR
40.0000 meq | EXTENDED_RELEASE_TABLET | Freq: Two times a day (BID) | ORAL | Status: DC
  Administered 2016-09-02 (×2): 40 meq via ORAL
  Filled 2016-09-02 (×2): qty 2

## 2016-09-02 MED ORDER — LACTATED RINGERS IV SOLN
INTRAVENOUS | Status: DC | PRN
  Administered 2016-09-02: 10:00:00 via INTRAVENOUS

## 2016-09-02 MED ORDER — AMIODARONE IV BOLUS ONLY 150 MG/100ML: 150.0000 mg | Freq: Once | INTRAVENOUS | Status: DC

## 2016-09-02 MED ORDER — ESMOLOL HCL 100 MG/10ML IV SOLN
INTRAVENOUS | Status: DC | PRN
  Administered 2016-09-02: 20 mg via INTRAVENOUS

## 2016-09-02 MED ORDER — SODIUM CHLORIDE 0.9 % IV SOLN: INTRAVENOUS | Status: DC

## 2016-09-02 NOTE — Interval H&P Note (Signed)
History and Physical Interval Note:  09/02/2016 10:16 AM  Carlos Norton  has presented today for surgery, with the diagnosis of anemia FOBT +  The various methods of treatment have been discussed with the patient and family. After consideration of risks, benefits and other options for treatment, the patient has consented to  Procedure(s): ENTEROSCOPY (N/A) COLONOSCOPY (N/A) as a surgical intervention .  The patient's history has been reviewed, patient examined, no change in status, stable for surgery.  I have reviewed the patient's chart and labs.  Questions were answered to the patient's satisfaction.     Kavitha Nandigam

## 2016-09-02 NOTE — Anesthesia Procedure Notes (Signed)
Procedure Name: MAC Date/Time: 09/02/2016 10:35 AM Performed by: Annabelle HarmanSMITH, Anahla Bevis A Pre-anesthesia Checklist: Patient identified, Emergency Drugs available, Suction available and Patient being monitored Patient Re-evaluated:Patient Re-evaluated prior to inductionOxygen Delivery Method: Nasal cannula Intubation Type: IV induction Placement Confirmation: positive ETCO2

## 2016-09-02 NOTE — Progress Notes (Signed)
      [address]       [name] [id number]             [phone]     CARDIOTHORACIC SURGERY PROGRESS NOTE  Subjective: Looks and feels better.  Still dyspneic with activity but improved.  Objective: Vital signs in last 24 hours: Temp:  [97.4 F (36.3 C)-98.1 F (36.7 C)] (P) 97.8 F (36.6 C) (10/04 1002) Pulse Rate:  [120-136] (P) 136 (10/04 1002) Cardiac Rhythm: Sinus tachycardia (10/04 0800) Resp:  [17-37] (P) 21 (10/04 1002) BP: (90-112)/(65-84) (P) 105/75 (10/04 1002) SpO2:  [96 %-100 %] (P) 97 % (10/04 1002)  Physical Exam:  Rhythm:   Aflutter  Breath sounds: Diminished at bases  Heart sounds:  RRR  Incisions:  n/a  Abdomen:  soft  Extremities:  warm   Intake/Output from previous day: 10/03 0701 - 10/04 0700 In: 3517 [P.O.:2468; I.V.:417.5; Blood:631.5] Out: 2475 [Urine:2475] Intake/Output this shift: Total I/O In: 16.7 [I.V.:16.7] Out: -   Lab Results:  Recent Labs  09/01/16 0717 09/02/16 0224  WBC 9.3 10.7*  HGB 6.1* 8.7*  HCT 22.1* 30.5*  PLT 301 317   BMET:  Recent Labs  09/01/16 0823 09/02/16 0224  NA 132* 134*  K 3.6 3.3*  CL 99* 96*  CO2 26 27  GLUCOSE 97 108*  BUN 25* 20  CREATININE 1.17 1.23  CALCIUM 8.8* 8.8*    CBG (last 3)  No results for input(s): GLUCAP in the last 72 hours. PT/INR:  No results for input(s): LABPROT, INR in the last 72 hours.  CXR:  N/A  Assessment/Plan:  Await results of EGD and colonoscopy.  Will need to make a decision about both short and long term plans for management of Aflutter, acute on chronic CHF and mitral stenosis once the dust settles a bit.  I will be out over the next few days and plan to f/u again on Monday morning.  Discussed w/ patient.  All questions answered.   I spent in excess of 15 minutes during the conduct of this hospital encounter and >50% of this time involved direct face-to-face encounter with the patient for counseling and/or coordination of their  care.    Purcell Nailslarence H Iqra Rotundo, MD 09/02/2016 10:08 AM

## 2016-09-02 NOTE — Anesthesia Preprocedure Evaluation (Addendum)
Anesthesia Evaluation  Patient identified by MRN, date of birth, ID band Patient awake    Reviewed: Allergy & Precautions, NPO status , Patient's Chart, lab work & pertinent test results, reviewed documented beta blocker date and time   Airway Mallampati: II  TM Distance: >3 FB Neck ROM: Full    Dental  (+) Edentulous Upper, Edentulous Lower   Pulmonary COPD, former smoker,    breath sounds clear to auscultation       Cardiovascular Pt. on home beta blockers + CAD and +CHF  + dysrhythmias Atrial Fibrillation  Rhythm:Irregular Rate:Tachycardia     Neuro/Psych negative neurological ROS  negative psych ROS   GI/Hepatic Neg liver ROS, GERD  Medicated,  Endo/Other  negative endocrine ROS  Renal/GU negative Renal ROS  negative genitourinary   Musculoskeletal negative musculoskeletal ROS (+)   Abdominal   Peds negative pediatric ROS (+)  Hematology negative hematology ROS (+)   Anesthesia Other Findings   Reproductive/Obstetrics negative OB ROS                            Anesthesia Physical Anesthesia Plan  ASA: III  Anesthesia Plan: MAC   Post-op Pain Management:    Induction: Intravenous  Airway Management Planned: Natural Airway  Additional Equipment:   Intra-op Plan: Utilization of Controlled Hypotension per surrgeon request  Post-operative Plan:   Informed Consent: I have reviewed the patients History and Physical, chart, labs and discussed the procedure including the risks, benefits and alternatives for the proposed anesthesia with the patient or authorized representative who has indicated his/her understanding and acceptance.     Plan Discussed with: CRNA  Anesthesia Plan Comments: (HR 140's in preop. Will give Amio bolus of 150mg , titrate esmolol as tolerated. )      Anesthesia Quick Evaluation

## 2016-09-02 NOTE — Anesthesia Postprocedure Evaluation (Signed)
Anesthesia Post Note  Patient: Lamount Cohendward L Broaddus  Procedure(s) Performed: Procedure(s) (LRB): ESOPHAGOGASTRODUODENOSCOPY (EGD) (N/A) aborted colonscopy  Patient location during evaluation: Endoscopy Anesthesia Type: MAC Level of consciousness: awake and alert Pain management: pain level controlled Vital Signs Assessment: post-procedure vital signs reviewed and stable Respiratory status: spontaneous breathing, nonlabored ventilation, respiratory function stable and patient connected to nasal cannula oxygen Cardiovascular status: stable and blood pressure returned to baseline Anesthetic complications: no    Last Vitals:  Vitals:   09/02/16 1145 09/02/16 1252  BP: 96/69   Pulse: (!) 107 (!) 109  Resp: (!) 25   Temp:  36.3 C    Last Pain:  Vitals:   09/02/16 1252  TempSrc: Oral                 Shelton SilvasKevin D Cecila Satcher

## 2016-09-02 NOTE — Transfer of Care (Signed)
Immediate Anesthesia Transfer of Care Note  Patient: Carlos Norton  Procedure(s) Performed: Procedure(s): ESOPHAGOGASTRODUODENOSCOPY (EGD) (N/A) aborted colonscopy  Patient Location: Endoscopy Unit  Anesthesia Type:MAC  Level of Consciousness: awake, alert , oriented and patient cooperative  Airway & Oxygen Therapy: Patient Spontanous Breathing and Patient connected to nasal cannula oxygen  Post-op Assessment: Report given to RN and Post -op Vital signs reviewed and stable  Post vital signs: Reviewed and stable  Last Vitals:  Vitals:   09/02/16 1124 09/02/16 1130  BP: (!) 78/48 (!) 85/54  Pulse: (!) 102 (!) 105  Resp: 19 (!) 22  Temp: 36.4 C     Last Pain:  Vitals:   09/02/16 1124  TempSrc: Oral         Complications: No apparent anesthesia complications

## 2016-09-02 NOTE — Op Note (Addendum)
[facility] Patient Name: Carlos Norton Procedure Date : 09/02/2016 MRN: [id number] Attending MD: Napoleon Form , MD Date of Birth: [birth year] CSN: [id number] Age: 75 Admit Type: Inpatient Procedure:                Small bowel enteroscopy Indications:              Suspected upper gastrointestinal bleeding in                            patient with unexplained iron deficiency anemia Providers:                Napoleon Form, MD, Elby Showers, RN, Darletta Moll Tech, Technician, Basilio Cairo, CRNA Referring MD:              Medicines:                Monitored Anesthesia Care Complications:            No immediate complications. Estimated Blood Loss:     Estimated blood loss was minimal. Procedure:                Pre-Anesthesia Assessment:                           - Prior to the procedure, a History and Physical                            was performed, and patient medications and                            allergies were reviewed. The patient's tolerance of                            previous anesthesia was also reviewed. The risks                            and benefits of the procedure and the sedation                            options and risks were discussed with the patient.                            All questions were answered, and informed consent                            was obtained. Prior Anticoagulants: The patient                            last took Eliquis (apixaban) 2 days prior to the                            procedure. ASA Grade Assessment: IV - A patient  with severe systemic disease that is a constant                            threat to life. After reviewing the risks and                            benefits, the patient was deemed in satisfactory                            condition to undergo the procedure.                           - Prior to the procedure, a History and Physical                            was performed, and patient medications and                            allergies were reviewed. The patient's tolerance of                            previous anesthesia was also reviewed. The risks                            and benefits of the procedure and the sedation                            options and risks were discussed with the patient.                            All questions were answered, and informed consent                            was obtained. Prior Anticoagulants: The patient                            last took Eliquis (apixaban) 2 days prior to the                            procedure. ASA Grade Assessment: IV - A patient                            with severe systemic disease that is a constant                            threat to life. After reviewing the risks and                            benefits, the patient was deemed in satisfactory                            condition to undergo the procedure.  After obtaining informed consent, the endoscope was                            passed under direct vision. Throughout the                            procedure, the patient's blood pressure, pulse, and                            oxygen saturations were monitored continuously. The                            EC-[id number] ([id number]) scope was introduced through                            the mouth, and advanced to the mid-jejunum. The                            upper GI endoscopy was accomplished without                            difficulty. The patient tolerated the procedure                            well. After obtaining informed consent, the                            endoscope was passed under direct vision.                            Throughout the procedure, the patient's blood                            pressure, pulse, and oxygen saturations were                            monitored continuously. Scope In: Scope  Out: Findings:      Patchy candidiasis was found in the middle third of the esophagus and in       the lower third of the esophagus. Biopsies were taken with a cold       forceps for histology.      A medium-sized hiatal hernia was present.      A single non-bleeding localized erosion was found in the cardia in the       hernia sac suggestive of Cameron's erosion There were stigmata of recent       bleeding.      A greater than 50 mm non-bleeding diverticulum was found in the third       portion of the duodenum.      Normal mucosa was found in the entire duodenum.      The examined jejunum was normal. Impression:               - Monilial esophagitis. Biopsied.                           -  Medium-sized hiatal hernia.                           - Gastric erosion with stigmata of recent bleeding.                           - Non-bleeding duodenal diverticulum.                           - Normal mucosa was found in the entire examined                            duodenum.                           - Normal examined jejunum. Moderate Sedation:      N/A Recommendation:           - Await pathology results.                           - PPI daily                           - Nystatin suspension 100,000 units PO QID for 7                            weeks.                           - Proceed with colonoscopy.                           - Continue present medications.                           - No repeat upper endoscopy.                           - Put patient on a clear liquid diet starting today. Procedure Code(s):        --- Professional ---                           [phone], Esophagogastroduodenoscopy, flexible,                            transoral; with biopsy, single or multiple Diagnosis Code(s):        --- Professional ---                           B37.81, Candidal esophagitis                           K44.9, Diaphragmatic hernia without obstruction or                            gangrene  K25.4, Chronic or unspecified gastric ulcer with                            hemorrhage                           D50.9, Iron deficiency anemia, unspecified                           K57.10, Diverticulosis of small intestine without                            perforation or abscess without bleeding CPT copyright 2016 [employer]. All rights reserved. The codes documented in this report are preliminary and upon coder review may  be revised to meet current compliance requirements. Napoleon FormKavitha V. Nandigam, MD 09/02/2016 11:40:42 AM This report has been signed electronically. Number of Addenda: 0

## 2016-09-02 NOTE — Progress Notes (Addendum)
Patient ID: Carlos Norton, male   DOB: [birth year], 75 y.o.   MRN: [id number]   75 yo with history of rheumatic heart disease, CAD s/p PCI LCx/RCA, paroxysmal atrial fibrillation, COPD, and prior GI bleeding was admitted with atrial flutter/RVR and noted to be markedly anemic.   Patient had minimally invasive MV repair + Maze in 2011, this was complicated by GI bleeding (source uncertain, do not have records).  He was eventually taken off anticoagulation.  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  Since that time, he has been on Eliquis 2.5 bid + ASA 81 daily.  No melena or BRBPR noted.  He developed progressive exertional dyspnea over the last 2 months.  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  He was referred to Dr Cornelius Moraswen for mitral valve replacement.  Plan for MVR next week.  Seen at CVTS office 10/2, noted to be tachycardic around 150 and more short of breath, directly admitted.  At [facility], noted to be in atrial flutter with RVR.  CBC returned with hgb 6.2.  He was initially started on amiodarone gtt + heparin gtt, heparin stopped with low hemoglobin.   2 units PRBCs on 10/3.   This morning, pt remains on amiodarone gtt with HR 110s-120s.  SBP 90s-100s.  He is comfortable at rest, short of breath with exertion.  Hemoglobin up to 8.7, he feels better overall. I/Os positive in setting of bowel prep.  [facility]: EF 55-60%, repaired mitral valve with mild-moderate MR and severe stenosis mean gradient 14 mmHg.    Scheduled Meds: . sodium chloride   Intravenous Once  . atorvastatin  20 mg Oral q1800  . docusate sodium  100 mg Oral Daily  . ferumoxytol  510 mg Intravenous Once  . furosemide  80 mg Intravenous BID  . mometasone-formoterol  2 puff Inhalation BID  . pantoprazole  40 mg Oral BID  . potassium chloride  40 mEq Oral BID  . tiotropium  18  mcg Inhalation Daily   Continuous Infusions: . sodium chloride    . amiodarone 30 mg/hr (09/02/16 0537)   PRN Meds:.acetaminophen, metoCLOPramide (REGLAN) injection, ondansetron (ZOFRAN) IV   Vitals:   09/01/16 2006 09/01/16 2008 09/02/16 0456 09/02/16 0500  BP:  104/84 97/72 97/72   Pulse:      Resp:  (!) 26 (!) 28 (!) 26  Temp:  97.6 F (36.4 C) 97.5 F (36.4 C)   TempSrc:  Oral Oral   SpO2: 98% 97% 97% 97%  Weight:      Height:        Intake/Output Summary (Last 24 hours) at 09/02/16 0727 Last data filed at 09/02/16 0600  Gross per 24 hour  Intake           3500.3 ml  Output             2475 ml  Net           1025.3 ml    LABS: Basic Metabolic Panel:  Recent Labs  40/98/1109/01/16 1934 09/01/16 0243 09/01/16 0823 09/02/16 0224  NA 136 132* 132* 134*  K 3.9 3.7 3.6 3.3*  CL 99* 98* 99* 96*  CO2 28 26 26 27   GLUCOSE 131* 101* 97 108*  BUN 27* 25* 25* 20  CREATININE 1.24 1.13 1.17 1.23  CALCIUM 9.1 8.8* 8.8* 8.8*  MG 2.0 2.0  --   --  Liver Function Tests:  Recent Labs  08/31/16 1934  AST 42*  ALT 28  ALKPHOS 138*  BILITOT 0.7  PROT 6.3*  ALBUMIN 3.2*   No results for input(s): LIPASE, AMYLASE in the last 72 hours. CBC:  Recent Labs  09/01/16 0717 09/02/16 0224  WBC 9.3 10.7*  HGB 6.1* 8.7*  HCT 22.1* 30.5*  MCV 70.2* 72.6*  PLT 301 317   Cardiac Enzymes: No results for input(s): CKTOTAL, CKMB, CKMBINDEX, TROPONINI in the last 72 hours. BNP: Invalid input(s): POCBNP D-Dimer: No results for input(s): DDIMER in the last 72 hours. Hemoglobin A1C: No results for input(s): HGBA1C in the last 72 hours. Fasting Lipid Panel: No results for input(s): CHOL, HDL, LDLCALC, TRIG, CHOLHDL, LDLDIRECT in the last 72 hours. Thyroid Function Tests:  Recent Labs  09/01/16 0243  TSH 1.979   Anemia Panel:  Recent Labs  09/02/16 0224  FERRITIN 39  TIBC 419  IRON 15*    RADIOLOGY: Dg Chest Port 1 View  Result Date: 09/01/2016 CLINICAL DATA:   Congestive heart failure. Shortness of breath . Congestive heart failure. Shortness breath. EXAM: PORTABLE CHEST 1 VIEW COMPARISON:  08/31/2016. FINDINGS: Cardiomegaly with diffuse bilateral pulmonary infiltrates and bilateral pleural effusions. 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 Exam stable prior exam. No pneumothorax. IMPRESSION: Persistent congestive heart failure with pulmonary edema bilateral pleural effusions again noted. No significant interim change. Electronically Signed   By: Maisie Fus  Register   On: 09/01/2016 07:02   Dg Chest Port 1 View  Result Date: 08/31/2016 CLINICAL DATA:  Atrial fibrillation, dyspnea EXAM: PORTABLE CHEST 1 VIEW COMPARISON:  04/09/2016 FINDINGS: There is cardiomegaly with pulmonary vascular redistribution consistent with CHF. Opacities are noted at the left lung base and periphery of the left lower thorax consistent with moderate layering effusion. Ovoid density in in the right mid thorax may represent fluid in the major fissure. Aortic atherosclerosis without aneurysm. No acute osseous abnormality. IMPRESSION: Cardiomegaly with CHF and bilateral pleural effusions left greater than right. Electronically Signed   By: Tollie Eth M.D.   On: 08/31/2016 23:42    PHYSICAL EXAM General: NAD Neck: JVP 12 cm, no thyromegaly or thyroid nodule.  Lungs: Dependent crackles.  CV: Nondisplaced PMI.  Heart tachy, regular S1/S2, no S3/S4, 1/6 SEM RUSB.  No peripheral edema.   Abdomen: Soft, nontender, no hepatosplenomegaly, no distention.  Neurologic: Alert and oriented x 3.  Psych: Normal affect. Extremities: No clubbing or cyanosis.   TELEMETRY: Reviewed telemetry pt in atrial flutter 110s-120s  ASSESSMENT AND PLAN: 75 yo with history of rheumatic heart disease, CAD s/p PCI LCx/RCA, paroxysmal atrial fibrillation, COPD, and prior GI bleeding was admitted with atrial flutter/RVR and noted to be markedly anemic.  1. Acute  on chronic diastolic CHF: Preserved EF on echo from [facility] with severe mitral stenosis.  [place]WP and PA pressure.  Patient is volume overloaded on exam in setting of severe mitral stenosis and atrial flutter with RVR.  The rapid rate impairs the already difficult LV filling.  Ultimately, needs to get out of atrial flutter.  However, our ability to cardiovert is curtailed by severe anemia and concern for GI bleeding.  - Control HR as much as possible with amiodarone gtt for now.   - Change Lasix to 80 mg IV bid today.   2. Mitral valve disease: Rheumatic MV, s/p MV repair in 2011.  Most [facility] showed mild to moderate MR with severe mitral stenosis, mean gradient 14.  Needs mitral valve replacement, but needs to be stabilized first.  3. Atrial flutter: With RVR.  Will tolerate poorly with severe mitral stenosis.  HR down to 110s-120s this morning, still in flutter, now on amiodarone.  It will be dangerous to cardiovert him until he can be anticoagulated.  He had a recent cardioembolic event (5/17 embolus to subclavian).  - Continue amiodarone gtt. - GI workup for bleeding.  Hopefully can anticoagulate him soon, allowing [place] to do a TEE-guided DCCV.  4. Anemia: Severe, hgb 6.1 at admission. Last hgb back in 7/17 was in the 9 range.  He has a history of GI bleeding in 2011 around the time of his prior cardiac surgery, I do not know the source.  He denies melena/BRBPR. 2 units PRBCs 10/3 with hgb up to 8.7.  - Continue to follow CBC - Protonix.  - Iron stores low, will give feraheme.  - GI has seen, plan EGD and colonoscopy today.   Marca Ancona 09/02/2016 7:27 AM

## 2016-09-02 NOTE — H&P (View-Only) (Signed)
[facility] Consult: 9:44 AM 09/01/2016  LOS: 1 day    Referring Provider: Dr Aundra Dubin  Primary Care Physician:  Rubie Maid, MD Primary Gastroenterologist:  Althia Forts.  Previous GI MDs in [facility].     Reason for Consultation:  anemia   HPI: Carlos Norton is a 75 y.o. male.  Hx MV stenosis, CAD and A fib.  S/p 2011 Maze procedure, MV repair.  Unfortunately he has progressed to severe symptomatic mitral stenosis, mitral regurgitation, and acute exacerbation of chronic diastolic congestive heart failure.  Dr Roxy Manns of [facility] has tentative plans for redo mitral valve replacement later next week.  Previous 2011 coronary artery stenting.  Had not been on Coumadin due to hx of severe anemia.  However after brachial embolectomy in 03/2016, Eliquis was inititated.  Also takes ASA.  04/2016 laser prostatectomy.   Had colonoscopy and EGD at [facility] in 2011 for overt GIB (hematochezia). They did not find a source but he required 14 PRBCs over course of admission.  The bleeding and anemia resolved once Coumadin and Plavix eliminated.  This took place prior to the MV repair/mazie procedure.   Sent for direct admit for decompensated diastolic heart failure in setting of a fib after 10/2 OV with Dr Roxy Manns.  In the last 4 weeks pt's DOE has progressed to resting dyspnea.  + anorexia and 20# weight loss.   CHF confirmed by CXR.   Hgb 03/2016: 11.5, MCV 93.  Today it is 6.2 and 6.1.  MCV 70  BNP 994.   + mild AKI: 27/1.2.   Elevated alk phos, 138.  AST/ALT 42/28.  T bili normal.   Heparin drip has replaced Eliquis.  PRBCs x 2 ordered.    In addition to anorexia, pt does not have reflux sxs or dysphagia.  Long term issues with constipation that he treats with daily colace.  Last BM was 2 days ago.  Stools formed and dark but not  black/bloody/tarry.  No abd pain. No NSAIDs, no ETOH.  Significant social issues include his wife's recent mastectomy, a daughter with pancreatic cancer who just entered hospice and granddaughter's wedding set for this coming weekend which pt will not be able to attend.      Past Medical History:  Diagnosis Date  . Atrial fibrillation ([place])   . BPH (benign prostatic hyperplasia) 08/15/2012  . 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 Chronic diastolic (congestive) heart failure   . COPD (chronic obstructive pulmonary disease) ([place]) 08/15/2012  . Coronary artery disease 12/19/2009   PCI and stenting of RCA and OM branch  . GI bleeding 2011   while patient on coumadin and Plavix - no source identified  . Mitral stenosis   . Pulmonary hypertension   . Rheumatic heart disease 08/15/2012  . S/P Maze operation for atrial fibrillation 06/18/2010   Left side lesion set using cryothermy with oversewing of LA appendage  . 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 BRACHIAL EMBOLECTOMY Left 04/09/2016  . GREEN LIGHT LASER TURP (TRANSURETHRAL RESECTION OF PROSTATE  05/07/2016  . MAZE  06/18/2010  . MITRAL VALVE REPAIR  06/18/2010  . PERCUTANEOUS CORONARY STENT INTERVENTION (PCI-S)  11/2009   RCA and LCx    Prior to Admission medications   Medication Sig Start Date End Date Taking? Authorizing Provider  apixaban (ELIQUIS) 2.5 MG TABS tablet Take 2.5 mg by mouth 2 (two) times daily.   Yes Historical Provider, MD  aspirin EC 81 MG tablet Take 81 mg by mouth daily.   Yes Historical Provider, MD  atorvastatin (LIPITOR) 20 MG tablet Take 20 mg by mouth daily at 6 PM.  07/30/12  Yes Historical Provider, MD  budesonide-formoterol (SYMBICORT) 80-4.5 MCG/ACT inhaler Inhale 2 puffs into the lungs 2  (two) times daily.   Yes Historical Provider, MD  Calcium Carbonate-Vitamin D3 (CALCIUM 600/VITAMIN D) 600-400 MG-UNIT TABS Take 1 tablet by mouth daily.   Yes Historical Provider, MD  furosemide (LASIX) 20 MG tablet Take 20 mg by mouth daily.  07/30/12  Yes Historical Provider, MD  KLOR-CON 10 10 MEQ tablet 10 mEq every other day.  08/13/12  Yes Historical Provider, MD  metoprolol succinate (TOPROL-XL) 25 MG 24 hr tablet Take 25 mg by mouth daily.   Yes Historical Provider, MD  pantoprazole (PROTONIX) 40 MG tablet 40 mg daily. TAKE AT 0600 08/03/12  Yes Historical Provider, MD  sildenafil (VIAGRA) 100 MG tablet Take 100 mg by mouth daily as needed for erectile dysfunction.   Yes Historical Provider, MD  SPIRIVA HANDIHALER 18 MCG inhalation capsule Place 18 mcg into inhaler and inhale daily.  08/13/12  Yes Historical Provider, MD    Scheduled Meds: . sodium chloride   Intravenous Once  . sodium chloride   Intravenous Once  . atorvastatin  20 mg Oral q1800  . feeding supplement (ENSURE ENLIVE)  237 mL Oral BID BM  . furosemide  40 mg Intravenous Q8H  . mometasone-formoterol  2 puff Inhalation BID  . pantoprazole  40 mg Oral BID  . potassium chloride  40 mEq Oral Once  . tiotropium  18 mcg Inhalation Daily   Infusions: . amiodarone 30 mg/hr (09/01/16 0544)   PRN Meds: acetaminophen, ondansetron (ZOFRAN) IV   Allergies as of 08/31/2016  . (No Known Allergies)    History reviewed. No pertinent family history.  Social History   Social History  . Marital status: Married    Spouse name: N/A  . Number of children: N/A  . Years of education: N/A   Occupational History  . Not on file.   Social History Main Topics  . Smoking status: Former Research scientist (life sciences)  . Smokeless tobacco: Never Used  . Alcohol use No  . Drug use: No  . Sexual activity: Not on file   Other Topics Concern  . Not on file   Social History Narrative  . No narrative on file    REVIEW OF SYSTEMS: Constitutional:   Tired, fatigue.  ENT:  No nose bleeds Pulm:  No cough.  + DOE CV:  No palpitations, no LE edema. No chest pain GU:  No hematuria, no frequency.  No blood in urine GI:  Per HPI Heme:  No unusual bleeding.  Stable occurrence of purpura on UE.   Transfusions:  Per HPI.   Neuro:  No headaches, no peripheral tingling or numbness Derm:  No itching, no rash  or sores.  Endocrine:  No sweats or chills.  No polyuria or dysuria Immunization:  Not queried Travel:  None beyond local counties in last few months.    PHYSICAL EXAM: Vital signs in last 24 hours: Vitals:   09/01/16 0810 09/01/16 0857  BP: 98/74 98/74  Pulse: (!) 124 (!) 124  Resp: (!) 29 (!) 28  Temp: 98 F (36.7 C)    Wt Readings from Last 3 Encounters:  08/31/16 68.3 kg (150 lb 9.2 oz)  08/31/16 69.9 kg (154 lb)  07/28/16 74.8 kg (165 lb)    General: pleasant, somewhat frail but not severely ill appearing elderly WM.  Looks better than advertised.  Head:  No asymmetry or swelling  Eyes:  No icterus or pallor Ears:  Not HOH  Nose:  No discharge or congestion Mouth:  Clear and moist oral MM and tongue midline.  Dentures in place Neck:  No mass, no JVD.  No TMG Lungs:  Fine crackles in bases.  No dyspnea with speech.  No cough   Heart: RRR.  Sinus tachy to 120s on monitor.  Abdomen:  Soft, NT, ND.  No mass or HSM.  No bruits.  Active BS.  No hernias.   Rectal: large volume of formed stool in rectum, no masses.  Stool is orange/light to medium brown and trace FOBT+.     Musc/Skeltl: kyphosis, no gross limb deformities or joint erythema.   Extremities:  No CCE  Neurologic:  Alert.  Oriented x 3.  No tremor or limb weakness.   Skin:  No rash or sores.  Some UE purpura bil.   Tattoos:  none Nodes:  No cervical adenopathy.    Psych:  Pleasant, in good spirits, calm.    Intake/Output from previous day: 10/02 0701 - 10/03 0700 In: 970 [P.O.:360; I.V.:610] Out: 450 [Urine:450] Intake/Output this shift: Total I/O In: -    Out: 100 [Urine:100]  LAB RESULTS:  Recent Labs  09/01/16 0243 09/01/16 0717  WBC 9.8 9.3  HGB 6.2* 6.1*  HCT 23.0* 22.1*  PLT 285 301   BMET Lab Results  Component Value Date   NA 132 (L) 09/01/2016   NA 132 (L) 09/01/2016   NA 136 08/31/2016   K 3.6 09/01/2016   K 3.7 09/01/2016   K 3.9 08/31/2016   CL 99 (L) 09/01/2016   CL 98 (L) 09/01/2016   CL 99 (L) 08/31/2016   CO2 26 09/01/2016   CO2 26 09/01/2016   CO2 28 08/31/2016   GLUCOSE 97 09/01/2016   GLUCOSE 101 (H) 09/01/2016   GLUCOSE 131 (H) 08/31/2016   BUN 25 (H) 09/01/2016   BUN 25 (H) 09/01/2016   BUN 27 (H) 08/31/2016   CREATININE 1.17 09/01/2016   CREATININE 1.13 09/01/2016   CREATININE 1.24 08/31/2016   CALCIUM 8.8 (L) 09/01/2016   CALCIUM 8.8 (L) 09/01/2016   CALCIUM 9.1 08/31/2016   LFT  Recent Labs  08/31/16 1934  PROT 6.3*  ALBUMIN 3.2*  AST 42*  ALT 28  ALKPHOS 138*  BILITOT 0.7   PT/INR Lab Results  Component Value Date   INR 1.45 06/18/2010   INR 1.08 06/14/2010   Hepatitis Panel No results for input(s): HEPBSAG, HCVAB, HEPAIGM, HEPBIGM in the last 72 hours. C-Diff No components found for: CDIFF Lipase  No results found for: LIPASE  Drugs of Abuse  No results found for: LABOPIA, COCAINSCRNUR, LABBENZ, AMPHETMU, THCU, LABBARB   RADIOLOGY STUDIES: Dg Chest Port 1 View  Result Date:  09/01/2016 CLINICAL DATA:  Congestive heart failure. Shortness of breath . Congestive heart failure. Shortness breath. EXAM: PORTABLE CHEST 1 VIEW COMPARISON:  08/31/2016. FINDINGS: Cardiomegaly with diffuse bilateral pulmonary infiltrates and bilateral pleural effusions. 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 Exam stable prior exam. No pneumothorax. IMPRESSION: Persistent congestive heart failure with pulmonary edema bilateral pleural effusions again noted. No significant interim change. Electronically Signed   By: Marcello Moores  Register   On:  09/01/2016 07:02   Dg Chest Port 1 View  Result Date: 08/31/2016 CLINICAL DATA:  Atrial fibrillation, dyspnea EXAM: PORTABLE CHEST 1 VIEW COMPARISON:  04/09/2016 FINDINGS: There is cardiomegaly with pulmonary vascular redistribution consistent with CHF. Opacities are noted at the left lung base and periphery of the left lower thorax consistent with moderate layering effusion. Ovoid density in in the right mid thorax may represent fluid in the major fissure. Aortic atherosclerosis without aneurysm. No acute osseous abnormality. IMPRESSION: Cardiomegaly with CHF and bilateral pleural effusions left greater than right. Electronically Signed   By: Ashley Royalty M.D.   On: 08/31/2016 23:42     IMPRESSION:   *  Anemia, borderline microcytic.  FOBT + but not grossly bleeding. Previous acute GI bleeding in 2011 in setting of plavix, coumadin.  No source ID'd on EGD or Colonoscopy.   Takes daily protonix so ulcers, gastritis less likely.  More likely source is AVMs.  Neoplasia also a possiblity  *  Severe MV stenosis, regurge, and diastolic heart failure.  All sxs exacerbated by severe anemia and a fib.  sxs improved with amiodarone, blood and diuretic.  Had patent stents on cardiac cath in [facility] a few months ago.    *  PAF, amiodarone drip initiated and currently in sinus tach.   *  Hyponatremia.     PLAN:     *  Needs EGD and colonoscopy but GI needs to discuss with surgeon and cardiologist regarding safety for sedation.    *  Added colace and single dose of Senakot for his chronic constipation.  Not sure he needs the PPI upped to BID as has been initiated but will leave the dosing in place for now.    Azucena Freed  09/01/2016, 9:44 AM Pager: [phone]

## 2016-09-02 NOTE — Op Note (Signed)
[facility] Patient Name: Carlos Norton Procedure Date : 09/02/2016 MRN: [id number] Attending MD: Napoleon Form , MD Date of Birth: [birth year] CSN: [id number] Age: 76 Admit Type: Inpatient Procedure:                Colonoscopy Indications:              Unexplained iron deficiency anemia Providers:                Napoleon Form, MD, Michel Bickers, RN,                            Darletta Moll Tech, Technician, Basilio Cairo, CRNA Referring MD:              Medicines:                Monitored Anesthesia Care Complications:            No immediate complications. Estimated Blood Loss:     Estimated blood loss: none. Procedure:                Pre-Anesthesia Assessment:                           - Prior to the procedure, a History and Physical                            was performed, and patient medications and                            allergies were reviewed. The patient's tolerance of                            previous anesthesia was also reviewed. The risks                            and benefits of the procedure and the sedation                            options and risks were discussed with the patient.                            All questions were answered, and informed consent                            was obtained. Prior Anticoagulants: The patient                            last took Eliquis (apixaban) 2 days prior to the                            procedure. ASA Grade Assessment: IV - A patient                            with severe systemic disease that is a constant  threat to life. After reviewing the risks and                            benefits, the patient was deemed in satisfactory                            condition to undergo the procedure.                           After obtaining informed consent, the colonoscope                            was passed under direct vision. Throughout the   procedure, the patient's blood pressure, pulse, and                            oxygen saturations were monitored continuously. The                            EC-[id number] ([id number] scope was introduced through                            the anus with the intention of advancing to the                            cecum. The scope was advanced to the sigmoid colon                            before the procedure was aborted. Medications were                            given. The colonoscopy was technically difficult                            and complex due to poor bowel prep with stool                            present and poor endoscopic visualization. The                            patient tolerated the procedure well. The quality                            of the bowel preparation was poor. The rectum was                            photographed. Scope In: 11:10:00 AM Scope Out: 11:14:23 AM Total Procedure Duration: 0 hours 4 minutes 23 seconds  Findings:      The perianal and digital rectal examinations were normal.      A large amount of semi-liquid semi-solid stool was found in the       recto-sigmoid colon, precluding visualization. Procedure aborted Impression:               - Stool in the  recto-sigmoid colon. Procedure                            aborted                           - No specimens collected. Moderate Sedation:      N/A Recommendation:           - Clear liquid diet today.                           - Continue present medications.                           - Repeat colonoscopy tomorrow with additional bowel                            prep (Moviprep) because the bowel preparation was                            poor. Procedure Code(s):        --- Professional ---                           [phone], 53, Colonoscopy, flexible; diagnostic,                            including collection of specimen(s) by brushing or                            washing, when performed (separate  procedure) Diagnosis Code(s):        --- Professional ---                           D50.9, Iron deficiency anemia, unspecified CPT copyright 2016 American Medical Association. All rights reserved. The codes documented in this report are preliminary and upon coder review may  be revised to meet current compliance requirements. Napoleon Form, MD 09/02/2016 11:27:15 AM This report has been signed electronically. Number of Addenda: 0

## 2016-09-03 ENCOUNTER — Inpatient Hospital Stay (HOSPITAL_COMMUNITY): Payer: Medicare Other | Admitting: Anesthesiology

## 2016-09-03 ENCOUNTER — Inpatient Hospital Stay (HOSPITAL_COMMUNITY): Payer: Medicare Other

## 2016-09-03 ENCOUNTER — Encounter (HOSPITAL_COMMUNITY)
Admission: AD | Disposition: A | Discharge status: Home / Self Care | Payer: Self-pay | Source: Ambulatory Visit | Attending: Cardiology

## 2016-09-03 ENCOUNTER — Encounter (HOSPITAL_COMMUNITY): Payer: Medicare Other

## 2016-09-03 ENCOUNTER — Encounter (HOSPITAL_COMMUNITY): Payer: Self-pay | Admitting: Gastroenterology

## 2016-09-03 DIAGNOSIS — K5521 Angiodysplasia of colon with hemorrhage: Secondary | ICD-10-CM

## 2016-09-03 DIAGNOSIS — D500 Iron deficiency anemia secondary to blood loss (chronic): Secondary | ICD-10-CM

## 2016-09-03 DIAGNOSIS — Z01810 Encounter for preprocedural cardiovascular examination: Secondary | ICD-10-CM

## 2016-09-03 DIAGNOSIS — Q2733 Arteriovenous malformation of digestive system vessel: Secondary | ICD-10-CM

## 2016-09-03 HISTORY — PX: COLONOSCOPY: SHX5424

## 2016-09-03 LAB — VAS US DOPPLER PRE CABG
LCCADSYS: 48 cm/s
LCCAPSYS: 44 cm/s
LEFT ECA DIAS: 15 cm/s
LEFT VERTEBRAL DIAS: 17 cm/s
LICADSYS: -69 cm/s
Left CCA dist dias: 15 cm/s
Left CCA prox dias: 14 cm/s
Left ICA dist dias: -28 cm/s
Left ICA prox dias: 21 cm/s
Left ICA prox sys: 57 cm/s
RCCADSYS: -84 cm/s
RCCAPDIAS: 20 cm/s
RCCAPSYS: 67 cm/s
RIGHT ECA DIAS: -16 cm/s
RIGHT VERTEBRAL DIAS: 12 cm/s

## 2016-09-03 LAB — BASIC METABOLIC PANEL
ANION GAP: 9 (ref 5–15)
ANION GAP: 14 (ref 5–15)
BUN: 14 mg/dL (ref 6–20)
BUN: 14 mg/dL (ref 6–20)
CHLORIDE: 97 mmol/L — AB (ref 101–111)
CHLORIDE: 96 mmol/L — AB (ref 101–111)
CO2: 31 mmol/L (ref 22–32)
CO2: 27 mmol/L (ref 22–32)
CREATININE: 1.22 mg/dL (ref 0.61–1.24)
CREATININE: 1.46 mg/dL — AB (ref 0.61–1.24)
Calcium: 8.7 mg/dL — ABNORMAL LOW (ref 8.9–10.3)
Calcium: 9.2 mg/dL (ref 8.9–10.3)
GFR calc non Af Amer: 57 mL/min — ABNORMAL LOW (ref >60)
GFR calc non Af Amer: 46 mL/min — ABNORMAL LOW (ref >60)
GFR, EST AFRICAN AMERICAN: 53 mL/min — AB (ref >60)
Glucose, Bld: 94 mg/dL (ref 65–99)
Glucose, Bld: 107 mg/dL — ABNORMAL HIGH (ref 65–99)
POTASSIUM: 2.9 mmol/L — AB (ref 3.5–5.1)
POTASSIUM: 3.8 mmol/L (ref 3.5–5.1)
SODIUM: 137 mmol/L (ref 135–145)
SODIUM: 137 mmol/L (ref 135–145)

## 2016-09-03 LAB — CBC
HEMATOCRIT: 28.5 % — AB (ref 39.0–52.0)
HEMOGLOBIN: 8.1 g/dL — AB (ref 13.0–17.0)
MCH: 20.8 pg — ABNORMAL LOW (ref 26.0–34.0)
MCHC: 28.4 g/dL — AB (ref 30.0–36.0)
MCV: 73.1 fL — ABNORMAL LOW (ref 78.0–100.0)
Platelets: 201 10*3/uL (ref 150–400)
RBC: 3.9 MIL/uL — AB (ref 4.22–5.81)
RDW: 17.6 % — ABNORMAL HIGH (ref 11.5–15.5)
WBC: 10 10*3/uL (ref 4.0–10.5)

## 2016-09-03 SURGERY — COLONOSCOPY
Anesthesia: Monitor Anesthesia Care

## 2016-09-03 MED ORDER — LACTATED RINGERS IV SOLN
INTRAVENOUS | Status: DC | PRN
  Administered 2016-09-03: 13:00:00 via INTRAVENOUS

## 2016-09-03 MED ORDER — ALBUMIN HUMAN 5 % IV SOLN
12.5000 g | Freq: Once | INTRAVENOUS | Status: AC
  Administered 2016-09-03: 12.5 g via INTRAVENOUS

## 2016-09-03 MED ORDER — POTASSIUM CHLORIDE CRYS ER 20 MEQ PO TBCR
40.0000 meq | EXTENDED_RELEASE_TABLET | Freq: Four times a day (QID) | ORAL | Status: DC
  Administered 2016-09-03: 40 meq via ORAL
  Filled 2016-09-03: qty 2

## 2016-09-03 MED ORDER — PROPOFOL 10 MG/ML IV BOLUS
INTRAVENOUS | Status: DC | PRN
  Administered 2016-09-03: 50 mg via INTRAVENOUS
  Administered 2016-09-03: 20 mg via INTRAVENOUS
  Administered 2016-09-03 (×3): 40 mg via INTRAVENOUS

## 2016-09-03 MED ORDER — SODIUM CHLORIDE 0.9 % IV SOLN
INTRAVENOUS | Status: DC
  Administered 2016-09-03: 13:00:00 via INTRAVENOUS

## 2016-09-03 MED ORDER — POTASSIUM CHLORIDE CRYS ER 20 MEQ PO TBCR
40.0000 meq | EXTENDED_RELEASE_TABLET | Freq: Two times a day (BID) | ORAL | Status: DC
  Administered 2016-09-03 – 2016-09-04 (×3): 40 meq via ORAL
  Filled 2016-09-03 (×3): qty 2

## 2016-09-03 MED ORDER — POTASSIUM CHLORIDE CRYS ER 20 MEQ PO TBCR: 40.0000 meq | EXTENDED_RELEASE_TABLET | Freq: Four times a day (QID) | ORAL | Status: DC

## 2016-09-03 MED ORDER — PHENYLEPHRINE HCL 10 MG/ML IJ SOLN
INTRAMUSCULAR | Status: DC | PRN
  Administered 2016-09-03: 50 ug/min via INTRAVENOUS

## 2016-09-03 MED ORDER — ALBUMIN HUMAN 25 % IV SOLN: 12.5000 g | Freq: Once | INTRAVENOUS | Status: DC

## 2016-09-03 MED ORDER — ALBUMIN HUMAN 5 % IV SOLN
INTRAVENOUS | Status: AC
  Filled 2016-09-03: qty 250

## 2016-09-03 NOTE — Interval H&P Note (Signed)
History and Physical Interval Note:  09/03/2016 1:28 PM  Carlos Norton  has presented today for surgery, with the diagnosis of anemia  The various methods of treatment have been discussed with the patient and family. After consideration of risks, benefits and other options for treatment, the patient has consented to  Procedure(s): COLONOSCOPY (N/A) as a surgical intervention .  The patient's history has been reviewed, patient examined, no change in status, stable for surgery.  I have reviewed the patient's chart and labs.  Questions were answered to the patient's satisfaction.     Reed Eifert

## 2016-09-03 NOTE — Anesthesia Postprocedure Evaluation (Signed)
Anesthesia Post Note  Patient: Carlos Norton  Procedure(s) Performed: Procedure(s) (LRB): COLONOSCOPY (N/A)  Patient location during evaluation: PACU Anesthesia Type: MAC Level of consciousness: awake and alert Pain management: pain level controlled Vital Signs Assessment: post-procedure vital signs reviewed and stable Respiratory status: spontaneous breathing, nonlabored ventilation, respiratory function stable and patient connected to nasal cannula oxygen Cardiovascular status: stable and blood pressure returned to baseline Anesthetic complications: no    Last Vitals:  Vitals:   09/03/16 1500 09/03/16 1505  BP: (!) 78/52 (!) 84/64  Pulse:    Resp: (!) 21 18  Temp:      Last Pain:  Vitals:   09/03/16 1421  TempSrc: Oral                 Olanrewaju Osborn,W. EDMOND

## 2016-09-03 NOTE — Progress Notes (Signed)
Patient ID: Carlos Norton, male   DOB: Aug 16, 1941, 75 y.o.   MRN: [id number]   75 yo with history of rheumatic heart disease, CAD s/p PCI LCx/RCA, paroxysmal atrial fibrillation, COPD, and prior GI bleeding was admitted with atrial flutter/RVR and noted to be markedly anemic.   Patient had minimally invasive MV repair + Maze in 2011, this was complicated by GI bleeding (source uncertain, do not have records).  He was eventually taken off anticoagulation.  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  Since that time, he has been on Eliquis 2.5 bid + ASA 81 daily.  No melena or BRBPR noted.  He developed progressive exertional dyspnea over the last 2 months.  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  He was referred to Dr Cornelius Moraswen for mitral valve replacement.  Plan for MVR next week.  Seen at CVTS office 10/2, noted to be tachycardic around 150 and more short of breath, directly admitted.  [facility], noted to be in atrial flutter with RVR.  CBC returned with hgb 6.2.  He was initially started on amiodarone gtt + heparin gtt, heparin stopped with low hemoglobin.   2 units PRBCs on 10/3.   This morning, pt remains on amiodarone gtt with HR down to 100s.  SBP 90s-100s.  He is comfortable at rest, short of breath with exertion.  Hemoglobin 8.7 => 8.1, he feels better overall. Weight down, I/Os not measured. No BRBPR/melena.   EGD/small bowel enteroscopy yesterday: Erosion in stomach with stigmata of recent bleeding.    E[facility]: EF 55-60%, repaired mitral valve with mild-moderate MR and severe stenosis mean gradient 14 mmHg.    Scheduled Meds: . sodium chloride   Intravenous Once  . atorvastatin  20 mg Oral q1800  . docusate sodium  100 mg Oral Daily  . furosemide  80 mg Intravenous BID  . mometasone-formoterol  2 puff Inhalation BID  . pantoprazole  40  mg Oral BID  . potassium chloride  40 mEq Oral Q6H  . tiotropium  18 mcg Inhalation Daily   Continuous Infusions: . amiodarone 30 mg/hr (09/03/16 0203)   PRN Meds:.acetaminophen, ondansetron (ZOFRAN) IV   Vitals:   09/03/16 0011 09/03/16 0353 09/03/16 0500 09/03/16 0757  BP: (!) 84/68 (!) 86/63    Pulse:      Resp: (!) 23 17    Temp: 98.6 F (37 C) 97.6 F (36.4 C)  97 F (36.1 C)  TempSrc: Oral Axillary  Axillary  SpO2: 97% 99%    Weight:   146 lb 13.2 oz (66.6 kg)   Height:        Intake/Output Summary (Last 24 hours) at 09/03/16 0758 Last data filed at 09/03/16 0203  Gross per 24 hour  Intake          1358.14 ml  Output             1150 ml  Net           208.14 ml    LABS: Basic Metabolic Panel:  Recent Labs  08/65/7809/01/16 1934 09/01/16 0243  09/02/16 0224 09/03/16 0249  NA 136 132*  < > 134* 137  K 3.9 3.7  < > 3.3* 2.9*  CL 99* 98*  < > 96* 97*  CO2 28 26  < > 27 31  GLUCOSE 131* 101*  < > 108*  94  BUN 27* 25*  < > 20 14  CREATININE 1.24 1.13  < > 1.23 1.22  CALCIUM 9.1 8.8*  < > 8.8* 8.7*  MG 2.0 2.0  --   --   --   < > = values in this interval not displayed. Liver Function Tests:  Recent Labs  08/31/16 1934  AST 42*  ALT 28  ALKPHOS 138*  BILITOT 0.7  PROT 6.3*  ALBUMIN 3.2*   No results for input(s): LIPASE, AMYLASE in the last 72 hours. CBC:  Recent Labs  09/02/16 0224 09/03/16 0249  WBC 10.7* 10.0  HGB 8.7* 8.1*  HCT 30.5* 28.5*  MCV 72.6* 73.1*  PLT 317 201   Cardiac Enzymes: No results for input(s): CKTOTAL, CKMB, CKMBINDEX, TROPONINI in the last 72 hours. BNP: Invalid input(s): POCBNP D-Dimer: No results for input(s): DDIMER in the last 72 hours. Hemoglobin A1C: No results for input(s): HGBA1C in the last 72 hours. Fasting Lipid Panel: No results for input(s): CHOL, HDL, LDLCALC, TRIG, CHOLHDL, LDLDIRECT in the last 72 hours. Thyroid Function Tests:  Recent Labs  09/01/16 0243  TSH 1.979   Anemia Panel:  Recent  Labs  09/02/16 0224  FERRITIN 39  TIBC 419  IRON 15*    RADIOLOGY: Dg Chest Port 1 View  Result Date: 09/01/2016 CLINICAL DATA:  Congestive heart failure. Shortness of breath . Congestive heart failure. Shortness breath. EXAM: PORTABLE CHEST 1 VIEW COMPARISON:  08/31/2016. FINDINGS: Cardiomegaly with diffuse bilateral pulmonary infiltrates and bilateral pleural effusions. 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 Exam stable prior exam. No pneumothorax. IMPRESSION: Persistent congestive heart failure with pulmonary edema bilateral pleural effusions again noted. No significant interim change. Electronically Signed   By: Maisie Fus  Register   On: 09/01/2016 07:02   Dg Chest Port 1 View  Result Date: 08/31/2016 CLINICAL DATA:  Atrial fibrillation, dyspnea EXAM: PORTABLE CHEST 1 VIEW COMPARISON:  04/09/2016 FINDINGS: There is cardiomegaly with pulmonary vascular redistribution consistent with CHF. Opacities are noted at the left lung base and periphery of the left lower thorax consistent with moderate layering effusion. Ovoid density in in the right mid thorax may represent fluid in the major fissure. Aortic atherosclerosis without aneurysm. No acute osseous abnormality. IMPRESSION: Cardiomegaly with CHF and bilateral pleural effusions left greater than right. Electronically Signed   By: Tollie Eth M.D.   On: 08/31/2016 23:42    PHYSICAL EXAM General: NAD Neck: JVP 10 cm, no thyromegaly or thyroid nodule.  Lungs: Dependent crackles.  CV: Nondisplaced PMI.  Heart tachy, regular S1/S2, no S3/S4, 1/6 SEM RUSB.  Trace ankle edema.   Abdomen: Soft, nontender, no hepatosplenomegaly, no distention.  Neurologic: Alert and oriented x 3.  Psych: Normal affect. Extremities: No clubbing or cyanosis.   TELEMETRY: Reviewed telemetry pt in ?atrial flutter 100s  ASSESSMENT AND PLAN: 75 yo with history of rheumatic heart disease, CAD s/p PCI LCx/RCA,  paroxysmal atrial fibrillation, COPD, and prior GI bleeding was admitted with atrial flutter/RVR and noted to be markedly anemic.  1. Acute on chronic diastolic CHF: Preserved EF on echo from [facility] with severe mitral stenosis.  [place] PCWP and PA pressure.  Patient is volume overloaded on exam in setting of severe mitral stenosis and atrial flutter with RVR.  The rapid rate impairs the already difficult LV filling.  HR in 100s today, think this is still atrial flutter.  Our ability to cardiovert is  curtailed by severe anemia and concern for GI bleeding. Still some volume overload on exam.  - Control HR as much as possible with amiodarone gtt for now, HR much improved.   - Continue Lasix 80 mg IV bid today, replace K.   2. Mitral valve disease: Rheumatic MV, s/p MV repair in 2011.  Most recent TEE at [facility] showed mild to moderate MR with severe mitral stenosis, mean gradient 14.  Needs mitral valve replacement, but needs to be stabilized first. He needs to be able to be anticoagulated post-op. 3. Atrial flutter: With RVR.  Will tolerate poorly with severe mitral stenosis.  HR down to 100s this morning, think he is still in flutter, now on amiodarone. It will be dangerous to cardiovert him until he can be anticoagulated.  He had a recent cardioembolic event (5/17 embolus to subclavian).  - Continue amiodarone gtt. - ECG today to confirm that he's still in flutter. - Ongoing GI workup for bleeding.  Hopefully can anticoagulate him soon, allowing [place] to do a TEE-guided DCCV.  4. Anemia: Severe, hgb 6.1 at admission. Last hgb back in 7/17 was in the 9 range.  He has a history of GI bleeding in 2011 around the time of his prior cardiac surgery, I do not know the source.  He denies melena/BRBPR. 2 units PRBCs 10/3 with hgb 8.7 => 8.1.  - Continue to follow CBC - Protonix.  - Iron stores low, got feraheme.  - EGD/enteroscopy showed gastric erosions, possible source of bleeding.   To have colonoscopy today.  Afterwards will need to discuss with GI risk of rebleeding.    Marca Ancona 09/03/2016 7:58 AM

## 2016-09-03 NOTE — Care Management Note (Signed)
Case Management Note  Patient Details  Name: Carlos Norton MRN: [id number] Date of Birth: Nov 04, 1941  Subjective/Objective:         Adm w at flutter           Action/Plan: lives w wife   Expected Discharge Date:  09/09/16               Expected Discharge Plan:  Home w Home Health Services  In-House Referral:     Discharge planning Services     Post Acute Care Choice:    Choice offered to:     DME Arranged:    DME Agency:     HH Arranged:    HH Agency:     Status of Service:  In process, will continue to follow  If discussed at Long Length of Stay Meetings, dates discussed:    Additional Comments:following for dc needs as pt progresses. Hanley Haysowell, Cari Burgo T, RN 09/03/2016, 10:53 AM

## 2016-09-03 NOTE — Transfer of Care (Signed)
Immediate Anesthesia Transfer of Care Note  Patient: Carlos Norton  Procedure(s) Performed: Procedure(s): COLONOSCOPY (N/A)  Patient Location: Endoscopy Unit  Anesthesia Type:MAC  Level of Consciousness: awake, alert  and oriented  Airway & Oxygen Therapy: Patient Spontanous Breathing and Patient connected to nasal cannula oxygen  Post-op Assessment: Report given to RN, Post -op Vital signs reviewed and stable and Patient moving all extremities X 4  Post vital signs: Reviewed and stable  Last Vitals:  Vitals:   09/03/16 1148 09/03/16 1240  BP:  99/68  Pulse:  (!) 108  Resp:  20  Temp: (!) 36.1 C 36.4 C    Last Pain:  Vitals:   09/03/16 1240  TempSrc: Oral         Complications: No apparent anesthesia complications

## 2016-09-03 NOTE — Anesthesia Preprocedure Evaluation (Addendum)
Anesthesia Evaluation  Patient identified by MRN, date of birth, ID band Patient awake    Reviewed: Allergy & Precautions, H&P , NPO status , Patient's Chart, lab work & pertinent test results  Airway Mallampati: II  TM Distance: >3 FB Neck ROM: Full    Dental no notable dental hx. (+) Edentulous Upper, Edentulous Lower, Dental Advisory Given   Pulmonary shortness of breath and with exertion, COPD, former smoker,    Pulmonary exam normal breath sounds clear to auscultation       Cardiovascular + CAD and +CHF  negative cardio ROS  + dysrhythmias Atrial Fibrillation + Valvular Problems/Murmurs  Rhythm:Irregular Rate:Tachycardia     Neuro/Psych negative neurological ROS  negative psych ROS   GI/Hepatic negative GI ROS, Neg liver ROS,   Endo/Other  negative endocrine ROS  Renal/GU negative Renal ROS  negative genitourinary   Musculoskeletal   Abdominal   Peds  Hematology negative hematology ROS (+) anemia ,   Anesthesia Other Findings   Reproductive/Obstetrics negative OB ROS                           Anesthesia Physical Anesthesia Plan  ASA: IV  Anesthesia Plan: MAC   Post-op Pain Management:    Induction: Intravenous  Airway Management Planned: Simple Face Mask  Additional Equipment:   Intra-op Plan:   Post-operative Plan:   Informed Consent: I have reviewed the patients History and Physical, chart, labs and discussed the procedure including the risks, benefits and alternatives for the proposed anesthesia with the patient or authorized representative who has indicated his/her understanding and acceptance.   Dental advisory given  Plan Discussed with: CRNA  Anesthesia Plan Comments:         Anesthesia Quick Evaluation

## 2016-09-03 NOTE — Progress Notes (Signed)
Pre-op Cardiac Surgery  Carotid Findings:  Bilateral: No significant (1-39%) ICA stenosis. Antegrade vertebral flow.    Upper Extremity Right Left  Brachial Pressures 95 96  Radial Waveforms Tri Tri  Ulnar Waveforms Tri Tri  Palmar Arch (Allen's Test) Obliterates with radial compression, normal with ulnar compression Obliterates with radial compression, normal with ulnar compression   Farrel DemarkJill Eunice, RDMS, RVT  09/03/2016

## 2016-09-03 NOTE — Progress Notes (Signed)
Notified NP Ingold of pt's BP in 80's/60's post colonoscopy. Pt is not symptomatic and denies shortness of breath, dizziness or lightheadedness.  Pt scheduled to get IV lasix this evening, per NP hold dose for now and monitor. Will continue to monitor patient closely.

## 2016-09-03 NOTE — Op Note (Addendum)
[facility] Patient Name: Carlos Norton Procedure Date : 09/03/2016 MRN: [id number] Attending MD: Napoleon FormKavitha V. Marcellous Snarski , MD Date of Birth: Aug 20, 1941 CSN: [id number] Age: 75 Admit Type: Inpatient Procedure:                Colonoscopy Indications:              Unexplained iron deficiency anemia Providers:                Napoleon FormKavitha V. Suesan Mohrmann, MD, Priscella MannAutumn Goldsmith, RN,                            Arlee Muslimhris Chandler Tech., Technician, Carmela RimaJohn F.                            Martinelli CRNA, CRNA Referring MD:              Medicines:                Monitored Anesthesia Care Complications:            No immediate complications. Estimated Blood Loss:     Estimated blood loss was minimal. Procedure:                Pre-Anesthesia Assessment:                           - Prior to the procedure, a History and Physical                            was performed, and patient medications and                            allergies were reviewed. The patient's tolerance of                            previous anesthesia was also reviewed. The risks                            and benefits of the procedure and the sedation                            options and risks were discussed with the patient.                            All questions were answered, and informed consent                            was obtained. Prior Anticoagulants: The patient has                            taken no previous anticoagulant or antiplatelet                            agents. ASA Grade Assessment: IV - A patient with  severe systemic disease that is a constant threat                            to life. After reviewing the risks and benefits,                            the patient was deemed in satisfactory condition to                            undergo the procedure.                           After obtaining informed consent, the colonoscope                            was passed under direct  vision. Throughout the                            procedure, the patient's blood pressure, pulse, and                            oxygen saturations were monitored continuously. The                            EC-[id number] ([id number]) scope was introduced through                            the anus and advanced to the the cecum, identified                            by appendiceal orifice and ileocecal valve. The                            colonoscopy was performed without difficulty. The                            patient tolerated the procedure well. The quality                            of the bowel preparation was adequate. The                            ileocecal valve, appendiceal orifice, and rectum                            were photographed. Scope In: 12:46:56 PM Scope Out: 2:11:23 PM Scope Withdrawal Time: 0 hours 16 minutes 25 seconds  Total Procedure Duration: 1 hour 24 minutes 27 seconds  Findings:      The perianal and digital rectal examinations were normal.      Two small, one large and two medium-sized localized angioectasias with       bleeding, active oozing noted from the large AVM. These were found in       the ascending colon and in the cecum. Coagulation for hemostasis using  argon plasma was successful. For hemostasis, one hemostatic clip was       successfully placed (MR conditional) on the large AVM. There was no       bleeding at the end of the procedure.      Multiple small and large-mouthed diverticula were found in the entire       colon.      Non-bleeding internal hemorrhoids were found during retroflexion. The       hemorrhoids were large. Impression:               - Five bleeding colonic angioectasias. Treated with                            argon plasma coagulation (APC). Clip (MR                            conditional) was placed.                           - Diverticulosis in the entire examined colon.                           - Non-bleeding internal  hemorrhoids.                           - No specimens collected. Moderate Sedation:      N/A Recommendation:           - Patient has a contact number available for                            emergencies. The signs and symptoms of potential                            delayed complications were discussed with the                            patient. Return to normal activities tomorrow.                            Written discharge instructions were provided to the                            patient.                           - Resume previous diet.                           - Continue present medications.                           - Resume Eliquis (apixaban) at prior dose tomorrow.                            Refer to managing physician for further adjustment  of therapy.                           - No recommendation at this time regarding repeat                            surveillance colonoscopy due to no polyps on                            today's exam.                           - Monitor Hgb and transfuse as needed. Will benefit                            from routine Iron replacement (Feraheme infusions)                           -Will sign off, please call with any questions Procedure Code(s):        --- Professional ---                           [phone], Colonoscopy, flexible; with control of                            bleeding, any method Diagnosis Code(s):        --- Professional ---                           K55.21, Angiodysplasia of colon with hemorrhage                           K64.8, Other hemorrhoids                           D50.9, Iron deficiency anemia, unspecified                           K57.30, Diverticulosis of large intestine without                            perforation or abscess without bleeding CPT copyright 2016 [employer]. All rights reserved. The codes documented in this report are preliminary and upon coder review  may  be revised to meet current compliance requirements. Napoleon Form, MD 09/03/2016 2:33:10 PM This report has been signed electronically. Number of Addenda: 0

## 2016-09-04 ENCOUNTER — Inpatient Hospital Stay (HOSPITAL_COMMUNITY): Payer: Medicare Other

## 2016-09-04 DIAGNOSIS — I4892 Unspecified atrial flutter: Secondary | ICD-10-CM

## 2016-09-04 LAB — CBC
HCT: 27.7 % — ABNORMAL LOW (ref 39.0–52.0)
HCT: 30.6 % — ABNORMAL LOW (ref 39.0–52.0)
Hemoglobin: 7.8 g/dL — ABNORMAL LOW (ref 13.0–17.0)
Hemoglobin: 8.4 g/dL — ABNORMAL LOW (ref 13.0–17.0)
MCH: 20.9 pg — ABNORMAL LOW (ref 26.0–34.0)
MCH: 20.6 pg — ABNORMAL LOW (ref 26.0–34.0)
MCHC: 28.2 g/dL — AB (ref 30.0–36.0)
MCHC: 27.5 g/dL — ABNORMAL LOW (ref 30.0–36.0)
MCV: 74.3 fL — ABNORMAL LOW (ref 78.0–100.0)
MCV: 75.2 fL — ABNORMAL LOW (ref 78.0–100.0)
PLATELETS: 181 10*3/uL (ref 150–400)
Platelets: 180 10*3/uL (ref 150–400)
RBC: 3.73 MIL/uL — ABNORMAL LOW (ref 4.22–5.81)
RBC: 4.07 MIL/uL — ABNORMAL LOW (ref 4.22–5.81)
RDW: 18.6 % — AB (ref 11.5–15.5)
RDW: 19 % — ABNORMAL HIGH (ref 11.5–15.5)
WBC: 10.3 10*3/uL (ref 4.0–10.5)
WBC: 10.7 10*3/uL — ABNORMAL HIGH (ref 4.0–10.5)

## 2016-09-04 LAB — SPIROMETRY WITH GRAPH
FEF 25-75 Pre: 0.24 L/s
FEF2575-%Pred-Pre: 9 %
FEV1-%Pred-Pre: 22 %
FEV1-Pre: 0.74 L
FEV1FVC-%Pred-Pre: 63 %
FEV6-%Pred-Pre: 33 %
FEV6-Pre: 1.43 L
FEV6FVC-%Pred-Pre: 96 %
FVC-%Pred-Pre: 34 %
FVC-Pre: 1.58 L
Pre FEV1/FVC ratio: 46 %
Pre FEV6/FVC Ratio: 91 %

## 2016-09-04 LAB — BASIC METABOLIC PANEL WITH GFR
Anion gap: 10 (ref 5–15)
BUN: 13 mg/dL (ref 6–20)
CO2: 30 mmol/L (ref 22–32)
Calcium: 9 mg/dL (ref 8.9–10.3)
Chloride: 98 mmol/L — ABNORMAL LOW (ref 101–111)
Creatinine, Ser: 1.25 mg/dL — ABNORMAL HIGH (ref 0.61–1.24)
GFR calc Af Amer: >60 mL/min
GFR calc non Af Amer: 55 mL/min — ABNORMAL LOW
Glucose, Bld: 87 mg/dL (ref 65–99)
Potassium: 3.3 mmol/L — ABNORMAL LOW (ref 3.5–5.1)
Sodium: 138 mmol/L (ref 135–145)

## 2016-09-04 LAB — ECHOCARDIOGRAM COMPLETE
Height: 71 in
Weight: 2412.71 [oz_av]

## 2016-09-04 MED ORDER — POTASSIUM CHLORIDE CRYS ER 20 MEQ PO TBCR
40.0000 meq | EXTENDED_RELEASE_TABLET | Freq: Once | ORAL | Status: AC
  Administered 2016-09-04: 40 meq via ORAL
  Filled 2016-09-04: qty 2

## 2016-09-04 MED ORDER — SODIUM CHLORIDE 0.9 % IV BOLUS (SEPSIS)
500.0000 mL | Freq: Once | INTRAVENOUS | Status: AC
  Administered 2016-09-04: 500 mL via INTRAVENOUS

## 2016-09-04 NOTE — Progress Notes (Signed)
Performed bedside PFT.  Did not attempt a post bronchodilator due to HR of low 100s and history of RVR.

## 2016-09-04 NOTE — Progress Notes (Signed)
   09/04/16 1530  Vitals  BP (!) 72/58  MAP (mmHg) (!) 63  ECG Heart Rate (!) 107  Resp (!) 30  Oxygen Therapy  SpO2 97 %  Amy Clegg notified of BP 72/58 with MAP 63.  Orders to stop lasix and give fluid bolus received.  Will continue to monitor pt closely.

## 2016-09-04 NOTE — Progress Notes (Signed)
Patient ID: Carlos Norton, male   DOB: [birth year], 75 y.o.   MRN: [id number]   75 yo with history of rheumatic heart disease, CAD s/p PCI LCx/RCA, paroxysmal atrial fibrillation, COPD, and prior GI bleeding was admitted with atrial flutter/RVR and noted to be markedly anemic.   Patient had minimally invasive MV repair + Maze in 2011, this was complicated by GI bleeding (source uncertain, do not have records).  He was eventually taken off anticoagulation.  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  Since that time, he has been on Eliquis 2.5 bid + ASA 81 daily.  No melena or BRBPR noted.  He developed progressive exertional dyspnea over the last 2 months.  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  He was referred to Dr Cornelius Moraswen for mitral valve replacement.  Plan for MVR next week.  Seen at CVTS office 10/2, noted to be tachycardic around 150 and more short of breath, directly admitted.  [facility], noted to be in atrial flutter with RVR.  CBC returned with hgb 6.2.  He was initially started on amiodarone gtt + heparin gtt, heparin stopped with low hemoglobin.   2 units PRBCs on 10/3.   This morning, pt remains on amiodarone gtt with HR down to 100s, still atrial flutter.  SBP 90s-100s.  He is comfortable at rest, short of breath with exertion.  Hemoglobin 8.7 => 8.1 => 7.8, he feels better overall.  No BRBPR/melena.  He only got 1 dose of Lasix yesterday.   EGD/small bowel enteroscopy 10/4: Erosion in stomach with stigmata of recent bleeding.   Colonoscopy 10/5: 5 colonic AVMs with oozing, APC + clip.  Likely this was bleeding source.   [facility]: EF 55-60%, repaired mitral valve with mild-moderate MR and severe stenosis mean gradient 14 mmHg.    Scheduled Meds: . sodium chloride   Intravenous Once  . atorvastatin  20 mg Oral q1800  . docusate sodium   100 mg Oral Daily  . furosemide  80 mg Intravenous BID  . mometasone-formoterol  2 puff Inhalation BID  . pantoprazole  40 mg Oral BID  . potassium chloride  40 mEq Oral BID  . potassium chloride  40 mEq Oral Once  . tiotropium  18 mcg Inhalation Daily   Continuous Infusions: . amiodarone 30 mg/hr (09/04/16 0058)   PRN Meds:.acetaminophen, ondansetron (ZOFRAN) IV   Vitals:   09/04/16 0100 09/04/16 0357 09/04/16 0400 09/04/16 0500  BP: (!) 87/62  (!) 84/67 90/66  Pulse:      Resp: 18  (!) 22 16  Temp:  98.4 F (36.9 C)    TempSrc:  Oral    SpO2: 98%  95% 98%  Weight:  150 lb 12.7 oz (68.4 kg)    Height:        Intake/Output Summary (Last 24 hours) at 09/04/16 0742 Last data filed at 09/04/16 [id number]  Gross per 24 hour  Intake          1540.07 ml  Output              826 ml  Net           714.07 ml    LABS: Basic Metabolic Panel:  Recent Labs  11/91/4709/04/15 0951 09/04/16 0216  NA 137 138  K 3.8 3.3*  CL 96* 98*  CO2 27 30  GLUCOSE  107* 87  BUN 14 13  CREATININE 1.46* 1.25*  CALCIUM 9.2 9.0   Liver Function Tests: No results for input(s): AST, ALT, ALKPHOS, BILITOT, PROT, ALBUMIN in the last 72 hours. No results for input(s): LIPASE, AMYLASE in the last 72 hours. CBC:  Recent Labs  09/03/16 0249 09/04/16 0216  WBC 10.0 10.3  HGB 8.1* 7.8*  HCT 28.5* 27.7*  MCV 73.1* 74.3*  PLT 201 181   Cardiac Enzymes: No results for input(s): CKTOTAL, CKMB, CKMBINDEX, TROPONINI in the last 72 hours. BNP: Invalid input(s): POCBNP D-Dimer: No results for input(s): DDIMER in the last 72 hours. Hemoglobin A1C: No results for input(s): HGBA1C in the last 72 hours. Fasting Lipid Panel: No results for input(s): CHOL, HDL, LDLCALC, TRIG, CHOLHDL, LDLDIRECT in the last 72 hours. Thyroid Function Tests: No results for input(s): TSH, T4TOTAL, T3FREE, THYROIDAB in the last 72 hours.  Invalid input(s): FREET3 Anemia Panel:  Recent Labs  09/02/16 0224  FERRITIN 39  TIBC  419  IRON 15*    RADIOLOGY: Dg Chest Port 1 View  Result Date: 09/01/2016 CLINICAL DATA:  Congestive heart failure. Shortness of breath . Congestive heart failure. Shortness breath. EXAM: PORTABLE CHEST 1 VIEW COMPARISON:  08/31/2016. FINDINGS: Cardiomegaly with diffuse bilateral pulmonary infiltrates and bilateral pleural effusions. 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 Exam stable prior exam. No pneumothorax. IMPRESSION: Persistent congestive heart failure with pulmonary edema bilateral pleural effusions again noted. No significant interim change. Electronically Signed   By: Maisie Fus  Register   On: 09/01/2016 07:02   Dg Chest Port 1 View  Result Date: 08/31/2016 CLINICAL DATA:  Atrial fibrillation, dyspnea EXAM: PORTABLE CHEST 1 VIEW COMPARISON:  04/09/2016 FINDINGS: There is cardiomegaly with pulmonary vascular redistribution consistent with CHF. Opacities are noted at the left lung base and periphery of the left lower thorax consistent with moderate layering effusion. Ovoid density in in the right mid thorax may represent fluid in the major fissure. Aortic atherosclerosis without aneurysm. No acute osseous abnormality. IMPRESSION: Cardiomegaly with CHF and bilateral pleural effusions left greater than right. Electronically Signed   By: Tollie Eth M.D.   On: 08/31/2016 23:42    PHYSICAL EXAM General: NAD Neck: JVP 10 cm, no thyromegaly or thyroid nodule.  Lungs: Dependent crackles.  CV: Nondisplaced PMI.  Heart tachy, regular S1/S2, no S3/S4, 1/6 SEM RUSB.  Trace ankle edema.   Abdomen: Soft, nontender, no hepatosplenomegaly, no distention.  Neurologic: Alert and oriented x 3.  Psych: Normal affect. Extremities: No clubbing or cyanosis.   TELEMETRY: Reviewed telemetry pt in ?atrial flutter 100s  ASSESSMENT AND PLAN: 75 yo with history of rheumatic heart disease, CAD s/p PCI LCx/RCA, paroxysmal atrial fibrillation, COPD, and prior  GI bleeding was admitted with atrial flutter/RVR and noted to be markedly anemic.  1. Acute on chronic diastolic CHF: Preserved EF on echo from [facility] with severe mitral stenosis.  RHC at [place] with elevated PCWP and PA pressure.  Patient is volume overloaded on exam in setting of severe mitral stenosis and atrial flutter with RVR.  The rapid rate impairs the already difficult LV filling.  HR in 100s today, remains in atrial flutter.  Our ability to cardiovert has curtailed by severe anemia and concern for GI bleeding. Still some volume overload on exam.  Only got 1 Lasix dose yesterday.  - Control HR as much as possible with amiodarone gtt for now, HR much improved.   - Continue  Lasix 80 mg IV bid today, replace K.   2. Mitral valve disease: Rheumatic MV, s/p MV repair in 2011.  Most recent TEE at [facility] showed mild to moderate MR with severe mitral stenosis, mean gradient 14.   - Repeat echo here, ordered.  - Plan for MVR next week with Dr Cornelius Moras.  Should be able to be anticoagulated now per GI.  3. Atrial flutter: With RVR.  Will tolerate poorly with severe mitral stenosis.  HR down to 100s this morning, still in flutter, now on amiodarone for rate control. It will be dangerous to cardiovert him until he can be anticoagulated.  He had a recent cardioembolic event (5/17 embolus to subclavian).  He had APC of colonic AVMs on 10/5.  Per GI, these were likely the source of bleeding and we should be able to resume anticoagulation.  - Continue amiodarone gtt. - Hgb down slightly today.  Tomorrow, if hgb stable, will start IV heparin gtt without bolus.  On Monday, could then do TEE-DCCV versus just wait for surgery and plan Maze at time of surgery as rate is fairly controlled.  Will need to discuss with Dr. Cornelius Moras.  4. Anemia: Severe, hgb 6.1 at admission. Last hgb back in 7/17 was in the 9 range.  He has a history of GI bleeding in 2011 around the time of his prior cardiac surgery, I do not know the  source.  He denies melena/BRBPR. 2 units PRBCs 10/3 with hgb 8.7 => 8.1 => 7.8.  He has had feraheme.  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  Also per GI, with treatment of colonic AVMs we should be able resume anticoagulation => as above, if hgb stable tomorrow, can restart heparin w/o bolus.   Marca Ancona 09/04/2016 7:42 AM

## 2016-09-04 NOTE — Progress Notes (Signed)
   09/04/16 1500  Vitals  BP (!) 76/57  MAP (mmHg) (!) 64  ECG Heart Rate (!) 106  Resp (!) 25  Oxygen Therapy  SpO2 98 %  Amy Clegg notified of blood pressure MAP <65 with current blood pressure 76/57.  Requested parameters for holding lasix.  Pt has voided 900 cc so far this shift.  Will continue to monitor pt closely.

## 2016-09-04 NOTE — Progress Notes (Signed)
  Echocardiogram 2D Echocardiogram has been performed.  Arvil ChacoFoster, Berlyn Saylor 09/04/2016, 4:23 PM

## 2016-09-04 NOTE — Progress Notes (Signed)
   Called by staff RN. Hypotensive SBP 72/58 consistently over the last hour.   Stop IV lasix. Give NS bolus now.   Timofey Carandang NP-C  4:16 PM

## 2016-09-05 DIAGNOSIS — I9589 Other hypotension: Secondary | ICD-10-CM

## 2016-09-05 LAB — CBC
HEMATOCRIT: 28.5 % — AB (ref 39.0–52.0)
HEMOGLOBIN: 7.9 g/dL — AB (ref 13.0–17.0)
MCH: 20.9 pg — AB (ref 26.0–34.0)
MCHC: 27.7 g/dL — AB (ref 30.0–36.0)
MCV: 75.4 fL — ABNORMAL LOW (ref 78.0–100.0)
Platelets: 205 10*3/uL (ref 150–400)
RBC: 3.78 MIL/uL — ABNORMAL LOW (ref 4.22–5.81)
RDW: 19.2 % — AB (ref 11.5–15.5)
WBC: 10.7 10*3/uL — ABNORMAL HIGH (ref 4.0–10.5)

## 2016-09-05 LAB — BASIC METABOLIC PANEL
Anion gap: 6 (ref 5–15)
BUN: 15 mg/dL (ref 6–20)
CALCIUM: 9.1 mg/dL (ref 8.9–10.3)
CHLORIDE: 103 mmol/L (ref 101–111)
CO2: 28 mmol/L (ref 22–32)
CREATININE: 1.37 mg/dL — AB (ref 0.61–1.24)
GFR calc non Af Amer: 50 mL/min — ABNORMAL LOW (ref >60)
GFR, EST AFRICAN AMERICAN: 57 mL/min — AB (ref >60)
Glucose, Bld: 110 mg/dL — ABNORMAL HIGH (ref 65–99)
Potassium: 4.4 mmol/L (ref 3.5–5.1)
Sodium: 137 mmol/L (ref 135–145)

## 2016-09-05 LAB — HEPARIN LEVEL (UNFRACTIONATED)
HEPARIN UNFRACTIONATED: 0.14 [IU]/mL — AB (ref 0.30–0.70)
HEPARIN UNFRACTIONATED: 0.28 [IU]/mL — AB (ref 0.30–0.70)

## 2016-09-05 LAB — APTT: APTT: 54 s — AB (ref 24–36)

## 2016-09-05 MED ORDER — HEPARIN (PORCINE) IN NACL 100-0.45 UNIT/ML-% IJ SOLN
1250.0000 [IU]/h | INTRAMUSCULAR | Status: DC
  Administered 2016-09-05: 900 [IU]/h via INTRAVENOUS
  Administered 2016-09-06: 1100 [IU]/h via INTRAVENOUS
  Administered 2016-09-07: 1250 [IU]/h via INTRAVENOUS
  Filled 2016-09-05 (×3): qty 250

## 2016-09-05 NOTE — Progress Notes (Signed)
ANTICOAGULATION CONSULT NOTE  Pharmacy Consult for resume heparin Indication: atrial fibrillation  No Known Allergies  Patient Measurements: Height: 5\' 11"  (180.3 cm) Weight: 149 lb 11.1 oz (67.9 kg) IBW/kg (Calculated) : 75.3 Heparin Dosing Weight: 68 kg  Vital Signs: Temp: 97.3 F (36.3 C) (10/07 1202) Temp Source: Oral (10/07 1202) BP: 85/58 (10/07 1202) Pulse Rate: 99 (10/07 1202)  Labs:  Recent Labs  09/03/16 0951 09/04/16 0216 09/04/16 1939 09/05/16 0244 09/05/16 1429  HGB  --  7.8* 8.4* 7.9*  --   HCT  --  27.7* 30.6* 28.5*  --   PLT  --  181 180 205  --   APTT  --   --   --   --  54*  HEPARINUNFRC  --   --   --   --  0.14*  CREATININE 1.46* 1.25*  --  1.37*  --     Estimated Creatinine Clearance: 46.1 mL/min (by C-G formula based on SCr of 1.37 mg/dL (H)).  Assessment: 75 yo male admitted with atrial flutter/RVR and marked anemia.  On apixaban PTA, which has been held since admission.  On IV heparin 10/2, which was subsequently held due to GI bleeding.  S/p GI intervention 10/5.  Hemoglobin relatively stable now, and pharmacy asked to resume IV heparin with no bolus.  Spoke with RN, no signs of overt bleeding this AM.  Initial levels on 900 units/hr heparin = hep lvl 0.14, aptt 54 - this shows apixaban is no longer affecting labs  Goal of Therapy:  Heparin level 0.3-0.5 - will target lower end given recent GIB Monitor platelets by anticoagulation protocol: Yes   Plan:  Increase heparin to 1000 units/hr - no bolus with low goal Next Hep Lvl 2200 Daily heparin level and PTT. F/u plans to transition back to apixaban when able.  Isaac BlissMichael Kimetha Trulson, PharmD, BCPS, [facility] Clinical Pharmacist Pager [phone] 09/05/2016 3:21 PM

## 2016-09-05 NOTE — Progress Notes (Signed)
Patient ID: Carlos Norton, male   DOB: August 12, 1941, 75 y.o.   MRN: [id number]   75 yo with history of rheumatic heart disease, CAD s/p PCI LCx/RCA, paroxysmal atrial fibrillation, COPD, and prior GI bleeding was admitted with atrial flutter/RVR and noted to be markedly anemic.   Patient had minimally invasive MV repair + Maze in 2011, this was complicated by GI bleeding (source uncertain, do not have records).  He was eventually taken off anticoagulation.  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  Since that time, he has been on Eliquis 2.5 bid + ASA 81 daily.  No melena or BRBPR noted.  He developed progressive exertional dyspnea over the last 2 months.  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  He was referred to Dr Cornelius Moraswen for mitral valve replacement.  Plan for MVR next week.  Seen at CVTS office 10/2, noted to be tachycardic around 150 and more short of breath, directly admitted.  [facility], noted to be in atrial flutter with RVR.  CBC returned with hgb 6.2.  He was initially started on amiodarone gtt + heparin gtt, heparin stopped with low hemoglobin.   2 units PRBCs on 10/3.   Was hypotensive again last night. Given IVF. And diuretics stopped.  SBP 80s today. ASymptomatice. Patient remains on amiodarone gtt with HR down to 100s, still atrial flutter.  He is comfortable at rest, short of breath with exertion.  Hemoglobin 8.7 => 8.1 => 7.8 => 8.4 => 7.9, he feels better overall.  No BRBPR/melena.   EGD/small bowel enteroscopy 10/4: Erosion in stomach with stigmata of recent bleeding.   Colonoscopy 10/5: 5 colonic AVMs with oozing, APC + clip.  Likely this was bleeding source.    Scheduled Meds: . sodium chloride   Intravenous Once  . atorvastatin  20 mg Oral q1800  . docusate sodium  100 mg Oral Daily  . mometasone-formoterol  2 puff  Inhalation BID  . pantoprazole  40 mg Oral BID  . tiotropium  18 mcg Inhalation Daily   Continuous Infusions: . amiodarone 30 mg/hr (09/05/16 1000)  . heparin 900 Units/hr (09/05/16 1016)   PRN Meds:.acetaminophen, ondansetron (ZOFRAN) IV   Vitals:   09/05/16 0700 09/05/16 0800 09/05/16 0850 09/05/16 1000  BP:  94/66  95/66  Pulse:  (!) 102  (!) 103  Resp:  (!) 23  (!) 24  Temp: 98.4 F (36.9 C)     TempSrc: Oral     SpO2:  98% 100% 98%  Weight:      Height:        Intake/Output Summary (Last 24 hours) at 09/05/16 1128 Last data filed at 09/05/16 1000  Gross per 24 hour  Intake           1344.1 ml  Output              875 ml  Net            469.1 ml    LABS: Basic Metabolic Panel:  Recent Labs  82/95/6209/05/16 0216 09/05/16 0244  NA 138 137  K 3.3* 4.4  CL 98* 103  CO2 30 28  GLUCOSE 87 110*  BUN 13 15  CREATININE 1.25* 1.37*  CALCIUM 9.0 9.1   Liver Function Tests: No results for input(s): AST, ALT, ALKPHOS, BILITOT, PROT, ALBUMIN in the last 72 hours.  No results for input(s): LIPASE, AMYLASE in the last 72 hours. CBC:  Recent Labs  09/04/16 1939 09/05/16 0244  WBC 10.7* 10.7*  HGB 8.4* 7.9*  HCT 30.6* 28.5*  MCV 75.2* 75.4*  PLT 180 205   Cardiac Enzymes: No results for input(s): CKTOTAL, CKMB, CKMBINDEX, TROPONINI in the last 72 hours. BNP: Invalid input(s): POCBNP D-Dimer: No results for input(s): DDIMER in the last 72 hours. Hemoglobin A1C: No results for input(s): HGBA1C in the last 72 hours. Fasting Lipid Panel: No results for input(s): CHOL, HDL, LDLCALC, TRIG, CHOLHDL, LDLDIRECT in the last 72 hours. Thyroid Function Tests: No results for input(s): TSH, T4TOTAL, T3FREE, THYROIDAB in the last 72 hours.  Invalid input(s): FREET3 Anemia Panel: No results for input(s): VITAMINB12, FOLATE, FERRITIN, TIBC, IRON, RETICCTPCT in the last 72 hours.  RADIOLOGY: Dg Chest Port 1 View  Result Date: 09/01/2016 CLINICAL DATA:  Congestive heart  failure. Shortness of breath . Congestive heart failure. Shortness breath. EXAM: PORTABLE CHEST 1 VIEW COMPARISON:  08/31/2016. FINDINGS: Cardiomegaly with diffuse bilateral pulmonary infiltrates and bilateral pleural effusions. 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 Exam stable prior exam. No pneumothorax. IMPRESSION: Persistent congestive heart failure with pulmonary edema bilateral pleural effusions again noted. No significant interim change. Electronically Signed   By: Maisie Fus  Register   On: 09/01/2016 07:02   Dg Chest Port 1 View  Result Date: 08/31/2016 CLINICAL DATA:  Atrial fibrillation, dyspnea EXAM: PORTABLE CHEST 1 VIEW COMPARISON:  04/09/2016 FINDINGS: There is cardiomegaly with pulmonary vascular redistribution consistent with CHF. Opacities are noted at the left lung base and periphery of the left lower thorax consistent with moderate layering effusion. Ovoid density in in the right mid thorax may represent fluid in the major fissure. Aortic atherosclerosis without aneurysm. No acute osseous abnormality. IMPRESSION: Cardiomegaly with CHF and bilateral pleural effusions left greater than right. Electronically Signed   By: Tollie Eth M.D.   On: 08/31/2016 23:42    PHYSICAL EXAM General: NAD Neck: JVP 10 cm, no thyromegaly or thyroid nodule.  Lungs: Dependent crackles.  CV: Nondisplaced PMI.  Heart tachy, regular S1/S2, no S3/S4, 1/6 SEM RUSB.  Trace ankle edema.   Abdomen: Soft, nontender, no hepatosplenomegaly, no distention.  Neurologic: Alert and oriented x 3.  Psych: Normal affect. Extremities: No clubbing or cyanosis.   TELEMETRY: Reviewed telemetry pt in ?atrial flutter 100s  ASSESSMENT AND PLAN: 75 yo with history of rheumatic heart disease, CAD s/p PCI LCx/RCA, paroxysmal atrial fibrillation, COPD, and prior GI bleeding was admitted with atrial flutter/RVR and noted to be markedly anemic.  1. Acute on chronic diastolic  CHF: Preserved EF on echo with severe mitral stenosis.  RHC at [place] with elevated PCWP and PA pressure.  Patient was volume overloaded on exam in setting of severe mitral stenosis and atrial flutter with RVR.  The rapid rate impairs the already difficult LV filling.  HR in 100s today, remains in atrial flutter.  Our ability to cardiovert has curtailed by severe anemia and concern for GI bleeding.  - Off lasix due to low BP. Volume status much better. Appetite improved. - Control HR as much as possible with amiodarone gtt for now, HR much improved.   2. Hypotension/shock - Hemorrhagic vs cardiogenic. Improved with IVF. Can add neo as needed to keep SBP >= 80 3. Mitral valve disease: Rheumatic MV, s/p MV repair in 2011.  Most recent TEE at [facility] showed mild to moderate  MR with severe mitral stenosis, mean gradient 14.   - Severe MS confirmed by echo 10/6 - Plan for MVR next week with Dr Cornelius Moras.  Should be able to be anticoagulated now per GI.  4. Atrial flutter: With RVR.  Tolerates poorly with severe mitral stenosis.  HR down to 100s this morning, still in flutter, now on amiodarone for rate control. It will be dangerous to cardiovert him until he can be anticoagulated.  He had a recent cardioembolic event (5/17 embolus to subclavian).  He had APC of colonic AVMs on 10/5.  Per GI, these were likely the source of bleeding and we should be able to resume anticoagulation.  - Continue amiodarone gtt. - Hgb overall stable from yesterday am. No evidence of ongoing GI bleed. Will start IV heparin gtt without bolus.  On Monday, could then do TEE-DCCV versus just wait for surgery and plan Maze at time of surgery as rate is fairly controlled.  Will need to discuss with Dr. Cornelius Moras.  5. Anemia: Severe, hgb 6.1 at admission. Last hgb back in 7/17 was in the 9 range.  He has a history of GI bleeding in 2011 around the time of his prior cardiac surgery,2 units PRBCs 10/3 with hgb 8.7 => 8.1 => 7.8 => 8.4 => 7.9.   He has had feraheme.  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  Also per GI, with treatment of colonic AVMs we should be able resume anticoagulation => as above start heparin carefully today Keep active T&S   Arvilla Meres MD 09/05/2016 11:28 AM

## 2016-09-05 NOTE — Progress Notes (Addendum)
ANTICOAGULATION CONSULT NOTE  Pharmacy Consult for resume heparin Indication: atrial fibrillation  No Known Allergies  Patient Measurements: Height: 5\' 11"  (180.3 cm) Weight: 149 lb 11.1 oz (67.9 kg) IBW/kg (Calculated) : 75.3 Heparin Dosing Weight: 68 kg  Vital Signs: Temp: 98 F (36.7 C) (10/07 0406) Temp Source: Axillary (10/07 0406) BP: 89/58 (10/07 0600) Pulse Rate: 100 (10/07 0600)  Labs:  Recent Labs  09/03/16 0951 09/04/16 0216 09/04/16 1939 09/05/16 0244  HGB  --  7.8* 8.4* 7.9*  HCT  --  27.7* 30.6* 28.5*  PLT  --  181 180 205  CREATININE 1.46* 1.25*  --  1.37*    Estimated Creatinine Clearance: 46.1 mL/min (by C-G formula based on SCr of 1.37 mg/dL (H)).  Assessment: 75 yo male admitted with atrial flutter/RVR and marked anemia.  On apixaban PTA, which has been held since admission.  On IV heparin 10/2, which was subsequently held due to GI bleeding.  S/p GI intervention 10/5.  Hemoglobin relatively stable now, and pharmacy asked to resume IV heparin with no bolus.  Spoke with RN, no signs of overt bleeding this AM.  Previous aPTT therapeutic on heparin gtt at 1000 units/hr.  Expect apixaban has cleared enough that heparin levels should be accurate.  Last apixaban dose 10/2.  Goal of Therapy:  Heparin level 0.3-0.5 - will target lower end given recent GIB Monitor platelets by anticoagulation protocol: Yes   Plan:  1. Start IV heparin 900 units/hr, no bolus. 2. Heparin level and PTT in 6 hrs. 3. Daily heparin level and PTT. 4. F/u plans to transition back to apixaban when able.  Tad MooreJessica Brayln Duque, Pharm D, BCPS  Clinical Pharmacist Pager [phone]  09/05/2016 7:55 AM

## 2016-09-05 NOTE — Progress Notes (Signed)
CARDIAC REHAB PHASE I   PRE:  Rate/Rhythm: 96 flutter  BP:  Sitting: 93/63       SaO2: 100 2L  MODE:  Ambulation: 200 ft   POST:  Rate/Rhythm: 107 flutter with occ PVC  BP:  Sitting: 96/63     SaO2: 100 2L  1530-1550 Patient ambulated in hallway x 1 assist with RW. Steady gait. No rest breaks. No complaints. Post ambulation patient back to recliner with call bell and phone in reach. Bedside RN has ordered O[facility] booklet and cardiac rehab will review preop, Will follow up on Monday.  Dayana Dalporto English PayneRN, BSN 09/05/2016 4:37 PM

## 2016-09-05 NOTE — Progress Notes (Signed)
ANTICOAGULATION CONSULT NOTE - Follow Up Consult  Pharmacy Consult for heparin Indication: atrial fibrillation  Labs:  Recent Labs  09/03/16 0951 09/04/16 0216 09/04/16 1939 09/05/16 0244 09/05/16 1429 09/05/16 2227  HGB  --  7.8* 8.4* 7.9*  --   --   HCT  --  27.7* 30.6* 28.5*  --   --   PLT  --  181 180 205  --   --   APTT  --   --   --   --  54*  --   HEPARINUNFRC  --   --   --   --  0.14* 0.28*  CREATININE 1.46* 1.25*  --  1.37*  --   --     Assessment: 75yo male remains subtherapeutic on heparin after rate change, closer to goal; no signs of bleeding per RN.  Goal of Therapy:  Heparin level 0.3-0.5 units/ml   Plan:  Will increase heparin gtt slightly to 1100 units/hr and check level in 6-8hr.  Vernard GamblesVeronda Maisy Newport, PharmD, BCPS  09/05/2016,11:17 PM

## 2016-09-06 ENCOUNTER — Inpatient Hospital Stay (HOSPITAL_COMMUNITY): Payer: Medicare Other

## 2016-09-06 LAB — BASIC METABOLIC PANEL
Anion gap: 8 (ref 5–15)
BUN: 15 mg/dL (ref 6–20)
CALCIUM: 8.9 mg/dL (ref 8.9–10.3)
CO2: 26 mmol/L (ref 22–32)
CREATININE: 1.12 mg/dL (ref 0.61–1.24)
Chloride: 100 mmol/L — ABNORMAL LOW (ref 101–111)
GFR calc non Af Amer: >60 mL/min (ref >60)
Glucose, Bld: 98 mg/dL (ref 65–99)
Potassium: 4 mmol/L (ref 3.5–5.1)
Sodium: 134 mmol/L — ABNORMAL LOW (ref 135–145)

## 2016-09-06 LAB — HEPARIN LEVEL (UNFRACTIONATED): HEPARIN UNFRACTIONATED: 0.3 [IU]/mL (ref 0.30–0.70)

## 2016-09-06 LAB — CBC
HEMATOCRIT: 29.7 % — AB (ref 39.0–52.0)
Hemoglobin: 8.2 g/dL — ABNORMAL LOW (ref 13.0–17.0)
MCH: 21.3 pg — ABNORMAL LOW (ref 26.0–34.0)
MCHC: 27.6 g/dL — AB (ref 30.0–36.0)
MCV: 77.1 fL — ABNORMAL LOW (ref 78.0–100.0)
PLATELETS: 138 10*3/uL — AB (ref 150–400)
RBC: 3.85 MIL/uL — ABNORMAL LOW (ref 4.22–5.81)
RDW: 20.8 % — AB (ref 11.5–15.5)
WBC: 8.4 10*3/uL (ref 4.0–10.5)

## 2016-09-06 NOTE — Progress Notes (Signed)
ANTICOAGULATION CONSULT NOTE  Pharmacy Consult for resume heparin Indication: atrial fibrillation  No Known Allergies  Patient Measurements: Height: 5\' 11"  (180.3 cm) Weight: 154 lb 5.2 oz (70 kg) IBW/kg (Calculated) : 75.3 Heparin Dosing Weight: 68 kg  Vital Signs: Temp: 97.9 F (36.6 C) (10/08 0407) Temp Source: Oral (10/08 0407) BP: 90/62 (10/08 0407) Pulse Rate: 97 (10/08 0500)  Labs:  Recent Labs  09/04/16 0216 09/04/16 1939 09/05/16 0244 09/05/16 1429 09/05/16 2227 09/06/16 0609  HGB 7.8* 8.4* 7.9*  --   --  8.2*  HCT 27.7* 30.6* 28.5*  --   --  29.7*  PLT 181 180 205  --   --  138*  APTT  --   --   --  54*  --   --   HEPARINUNFRC  --   --   --  0.14* 0.28* 0.30  CREATININE 1.25*  --  1.37*  --   --  1.12    Estimated Creatinine Clearance: 58.2 mL/min (by C-G formula based on SCr of 1.12 mg/dL).   Marland Kitchen. amiodarone 30 mg/hr (09/06/16 0144)  . heparin 1,100 Units/hr (09/06/16 0000)     Assessment: 75 yo male admitted with atrial flutter/RVR and marked anemia.  On apixaban PTA, which has been held since admission.  On IV heparin 10/2, which was subsequently held due to GI bleeding.  S/p GI intervention 10/5.  Hemoglobin relatively stable now, and pharmacy asked to resume IV heparin with no bolus.  Spoke with RN, no signs of overt bleeding this AM.  AM heparin level therapeutic on heparin.  No bleeding noted, Hgb remains low but stable.  Platelet count with slight decline.  Goal of Therapy:  Heparin level 0.3-0.5 - will target lower end given recent GIB Monitor platelets by anticoagulation protocol: Yes   Plan:  Continue IV heparin at 1100 units/hr. Daily heparin level. F/u plans to transition back to oral anticoagulation when able.  Tad MooreJessica Maysel Mccolm, Pharm D, BCPS  Clinical Pharmacist Pager [phone]  09/06/2016 7:57 AM

## 2016-09-06 NOTE — Progress Notes (Signed)
Patient ID: Carlos Norton, male   DOB: 07-14-1941, 75 y.o.   MRN: [id number]   75 yo with history of rheumatic heart disease, CAD s/p PCI LCx/RCA, paroxysmal atrial fibrillation, COPD, and prior GI bleeding was admitted with atrial flutter/RVR and noted to be markedly anemic.   Patient had minimally invasive MV repair + Maze in 2011, this was complicated by GI bleeding (source uncertain, do not have records).  He was eventually taken off anticoagulation.  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  Since that time, he has been on Eliquis 2.5 bid + ASA 81 daily.  No melena or BRBPR noted.  He developed progressive exertional dyspnea over the last 2 months.  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  He was referred to Dr Cornelius Moraswen for mitral valve replacement.  Plan for MVR next week.  Seen at CVTS office 10/2, noted to be tachycardic around 150 and more short of breath, directly admitted.  At [facility], noted to be in atrial flutter with RVR.  CBC returned with hgb 6.2.  He was initially started on amiodarone gtt + heparin gtt, heparin stopped with low hemoglobin.   2 units PRBCs on 10/3.   Lasix on hold due to hypotension on Friday. SBP now 90-105. Feels better. Great appetite. Remains on amiodarone gtt with HR down to 90-105, still atrial flutter.  Heparin restarted 10/7. Hemoglobin 8.7 => 8.1 => 7.8 => 8.4 => 7.9 => 8.2.   No BRBPR/melena.   EGD/small bowel enteroscopy 10/4: Erosion in stomach with stigmata of recent bleeding.   Colonoscopy 10/5: 5 colonic AVMs with oozing, APC + clip.  Likely this was bleeding source.    Scheduled Meds: . sodium chloride   Intravenous Once  . atorvastatin  20 mg Oral q1800  . docusate sodium  100 mg Oral Daily  . mometasone-formoterol  2 puff Inhalation BID  . pantoprazole  40 mg Oral BID  . tiotropium  18 mcg  Inhalation Daily   Continuous Infusions: . amiodarone 30 mg/hr (09/06/16 0144)  . heparin 1,100 Units/hr (09/06/16 1045)   PRN Meds:.acetaminophen, ondansetron (ZOFRAN) IV   Vitals:   09/06/16 0407 09/06/16 0500 09/06/16 0800 09/06/16 0852  BP: 90/62  104/75   Pulse: 98 97 95   Resp: (!) 24 (!) 26 16   Temp: 97.9 F (36.6 C)  97.6 F (36.4 C)   TempSrc: Oral  Oral   SpO2: 97% 96% 99% 99%  Weight:  70 kg (154 lb 5.2 oz)    Height:        Intake/Output Summary (Last 24 hours) at 09/06/16 1205 Last data filed at 09/06/16 0900  Gross per 24 hour  Intake              912 ml  Output              520 ml  Net              392 ml    LABS: Basic Metabolic Panel:  Recent Labs  56/38/7509/06/15 0244 09/06/16 0609  NA 137 134*  K 4.4 4.0  CL 103 100*  CO2 28 26  GLUCOSE 110* 98  BUN 15 15  CREATININE 1.37* 1.12  CALCIUM 9.1 8.9   Liver Function Tests: No results for input(s): AST, ALT, ALKPHOS, BILITOT, PROT, ALBUMIN in the last 72  hours. No results for input(s): LIPASE, AMYLASE in the last 72 hours. CBC:  Recent Labs  09/05/16 0244 09/06/16 0609  WBC 10.7* 8.4  HGB 7.9* 8.2*  HCT 28.5* 29.7*  MCV 75.4* 77.1*  PLT 205 138*   Cardiac Enzymes: No results for input(s): CKTOTAL, CKMB, CKMBINDEX, TROPONINI in the last 72 hours. BNP: Invalid input(s): POCBNP D-Dimer: No results for input(s): DDIMER in the last 72 hours. Hemoglobin A1C: No results for input(s): HGBA1C in the last 72 hours. Fasting Lipid Panel: No results for input(s): CHOL, HDL, LDLCALC, TRIG, CHOLHDL, LDLDIRECT in the last 72 hours. Thyroid Function Tests: No results for input(s): TSH, T4TOTAL, T3FREE, THYROIDAB in the last 72 hours.  Invalid input(s): FREET3 Anemia Panel: No results for input(s): VITAMINB12, FOLATE, FERRITIN, TIBC, IRON, RETICCTPCT in the last 72 hours.  RADIOLOGY: Dg Chest Port 1 View  Result Date: 09/01/2016 CLINICAL DATA:  Congestive heart failure. Shortness of breath .  Congestive heart failure. Shortness breath. EXAM: PORTABLE CHEST 1 VIEW COMPARISON:  08/31/2016. FINDINGS: Cardiomegaly with diffuse bilateral pulmonary infiltrates and bilateral pleural effusions. 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 Exam stable prior exam. No pneumothorax. IMPRESSION: Persistent congestive heart failure with pulmonary edema bilateral pleural effusions again noted. No significant interim change. Electronically Signed   By: Maisie Fus  Register   On: 09/01/2016 07:02   Dg Chest Port 1 View  Result Date: 08/31/2016 CLINICAL DATA:  Atrial fibrillation, dyspnea EXAM: PORTABLE CHEST 1 VIEW COMPARISON:  04/09/2016 FINDINGS: There is cardiomegaly with pulmonary vascular redistribution consistent with CHF. Opacities are noted at the left lung base and periphery of the left lower thorax consistent with moderate layering effusion. Ovoid density in in the right mid thorax may represent fluid in the major fissure. Aortic atherosclerosis without aneurysm. No acute osseous abnormality. IMPRESSION: Cardiomegaly with CHF and bilateral pleural effusions left greater than right. Electronically Signed   By: Tollie Eth M.D.   On: 08/31/2016 23:42    PHYSICAL EXAM General: NAD Neck: JVP 7 cm, no thyromegaly or thyroid nodule.  Lungs: Clear. Decreased at bases.   CV: Nondisplaced PMI.  Heart tachy, regular S1/S2, no S3/S4, 1/6 SEM RUSB.  Trace ankle edema.   Abdomen: Soft, nontender, no hepatosplenomegaly, no distention.  Neurologic: Alert and oriented x 3.  Psych: Normal affect. Extremities: No clubbing or cyanosis.   TELEMETRY: Reviewed telemetry pt in atrial flutter/tach 90-105  ASSESSMENT AND PLAN: 75 yo with history of rheumatic heart disease, CAD s/p PCI LCx/RCA, paroxysmal atrial fibrillation, COPD, and prior GI bleeding was admitted with atrial flutter/RVR and noted to be markedly anemic.  1. Acute on chronic diastolic CHF: Preserved EF  on echo with severe mitral stenosis.  RHC [place] with elevated PCWP and PA pressure.  Patient was volume overloaded on exam in setting of severe mitral stenosis and atrial flutter with RVR.  The rapid rate impairs the already difficult LV filling.  HR in 100s today, remains in atrial flutter.  Our ability to cardiovert has curtailed by severe anemia and concern for GI bleeding.  - Off lasix due to low BP. Volume status much better. Appetite improved. Weight creeping up. Can restart po lasix tomorrow.  - Control HR as much as possible with amiodarone gtt for now, HR much improved.   2. Hypotension/shock - Hemorrhagic vs cardiogenic.BP much better with holding diuretics. 3. Mitral valve disease: Rheumatic MV, s/p MV repair in 2011.  Most recent TEE at [facility] showed mild to moderate MR with severe mitral stenosis, mean gradient 14.   - Severe MS confirmed by echo 10/6 - Plan for MVR this week with Dr Cornelius Moras.  Back on heparin 4. Atrial flutter: With RVR.  Tolerates poorly with severe mitral stenosis.  HR 90-100s this morning, still in flutter, now on amiodarone for rate control. It will be dangerous to cardiovert him until he can be anticoagulated.  He had a recent cardioembolic event (5/17 embolus to subclavian).  He had APC of colonic AVMs on 10/5.  Per GI, these were likely the source of bleeding and we should be able to resume anticoagulation.  - Continue amiodarone gtt. - Hgb overall stable from yesterday am. No evidence of ongoing GI bleed. Tolerating heparin gtt without bolus. Now with decent rate contorl and stable hemodynamics will not cardiovert him until  surgery and plan Maze at time of surgery unless Dr. Cornelius Moras feels differently. 5. Anemia: Severe, hgb 6.1 at admission. Last hgb back in 7/17 was in the 9 range.  He has a history of GI bleeding in 2011 around the time of his prior cardiac surgery,2 units PRBCs 10/3 with hgb 8.7 => 8.1 => 7.8 => 8.4 => 7.9 => 8.2.  He has had feraheme.   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  Also per GI, with treatment of colonic AVMs we should be able resume anticoagulation => back on heparin. Hgb stable    Arvilla Meres MD 09/06/2016 12:05 PM

## 2016-09-07 ENCOUNTER — Inpatient Hospital Stay (HOSPITAL_COMMUNITY): Payer: Medicare Other

## 2016-09-07 ENCOUNTER — Ambulatory Visit: Payer: Medicare Other | Admitting: Thoracic Surgery (Cardiothoracic Vascular Surgery)

## 2016-09-07 DIAGNOSIS — N390 Urinary tract infection, site not specified: Secondary | ICD-10-CM

## 2016-09-07 DIAGNOSIS — I340 Nonrheumatic mitral (valve) insufficiency: Secondary | ICD-10-CM

## 2016-09-07 HISTORY — DX: Urinary tract infection, site not specified: N39.0

## 2016-09-07 LAB — URINALYSIS, ROUTINE W REFLEX MICROSCOPIC
Bilirubin Urine: NEGATIVE
GLUCOSE, UA: NEGATIVE mg/dL
KETONES UR: NEGATIVE mg/dL
Nitrite: POSITIVE — AB
PROTEIN: NEGATIVE mg/dL
Specific Gravity, Urine: 1.024 (ref 1.005–1.030)
pH: 5 (ref 5.0–8.0)

## 2016-09-07 LAB — BASIC METABOLIC PANEL
Anion gap: 9 (ref 5–15)
BUN: 10 mg/dL (ref 6–20)
CALCIUM: 9 mg/dL (ref 8.9–10.3)
CHLORIDE: 99 mmol/L — AB (ref 101–111)
CO2: 28 mmol/L (ref 22–32)
Creatinine, Ser: 1.08 mg/dL (ref 0.61–1.24)
GFR calc Af Amer: >60 mL/min (ref >60)
GLUCOSE: 98 mg/dL (ref 65–99)
POTASSIUM: 3.8 mmol/L (ref 3.5–5.1)
SODIUM: 136 mmol/L (ref 135–145)

## 2016-09-07 LAB — URINE MICROSCOPIC-ADD ON

## 2016-09-07 LAB — CBC
HCT: 28.8 % — ABNORMAL LOW (ref 39.0–52.0)
HEMOGLOBIN: 8.1 g/dL — AB (ref 13.0–17.0)
MCH: 21.6 pg — AB (ref 26.0–34.0)
MCHC: 28.1 g/dL — ABNORMAL LOW (ref 30.0–36.0)
MCV: 76.8 fL — AB (ref 78.0–100.0)
Platelets: 138 10*3/uL — ABNORMAL LOW (ref 150–400)
RBC: 3.75 MIL/uL — AB (ref 4.22–5.81)
RDW: 21.3 % — ABNORMAL HIGH (ref 11.5–15.5)
WBC: 7.7 10*3/uL (ref 4.0–10.5)

## 2016-09-07 LAB — HEPARIN LEVEL (UNFRACTIONATED)
Heparin Unfractionated: <0.1 IU/mL — ABNORMAL LOW (ref 0.30–0.70)
Heparin Unfractionated: <0.1 IU/mL — ABNORMAL LOW (ref 0.30–0.70)

## 2016-09-07 MED ORDER — HEPARIN (PORCINE) IN NACL 100-0.45 UNIT/ML-% IJ SOLN
1200.0000 [IU]/h | INTRAMUSCULAR | Status: DC
  Administered 2016-09-08: 1550 [IU]/h via INTRAVENOUS
  Filled 2016-09-07: qty 250

## 2016-09-07 MED ORDER — APIXABAN 5 MG PO TABS: 5.0000 mg | ORAL_TABLET | Freq: Two times a day (BID) | ORAL | Status: DC

## 2016-09-07 MED ORDER — FUROSEMIDE 20 MG PO TABS
20.0000 mg | ORAL_TABLET | Freq: Every day | ORAL | Status: DC
  Administered 2016-09-07 – 2016-09-08 (×2): 20 mg via ORAL
  Filled 2016-09-07 (×2): qty 1

## 2016-09-07 NOTE — Progress Notes (Addendum)
[address]       Carlos Norton [address]             [phone]        CARDIOTHORACIC SURGERY PROGRESS NOTE   R4 Days Post-Op Procedure(s) (LRB): COLONOSCOPY (N/A)  Subjective: Feels better but reports some congestion in lungs.  No BM since colonoscopy.  Eating well and feels stronger  Objective: Vital signs: BP Readings from Last 1 Encounters:  09/07/16 102/69   Pulse Readings from Last 1 Encounters:  09/07/16 96   Resp Readings from Last 1 Encounters:  09/07/16 (!) 25   Temp Readings from Last 1 Encounters:  09/07/16 97.7 F (36.5 C) (Oral)    Hemodynamics:    Physical Exam:  Rhythm:   Aflutter  Breath sounds: Scattered rhonchi, decreased BS's at bases  Heart sounds:  RRR  Incisions:  n/a  Abdomen:  soft  Extremities:  Warm, well-perfused   Intake/Output from previous day: 10/08 0701 - 10/09 0700 In: 835.7 [P.O.:240; I.V.:595.7] Out: 400 [Urine:400] Intake/Output this shift: Total I/O In: 16.7 [I.V.:16.7] Out: 300 [Urine:300]  Lab Results:  CBC: Recent Labs  09/06/16 0609 09/07/16 0251  WBC 8.4 7.7  HGB 8.2* 8.1*  HCT 29.7* 28.8*  PLT 138* 138*    BMET:  Recent Labs  09/06/16 0609 09/07/16 0251  NA 134* 136  K 4.0 3.8  CL 100* 99*  CO2 26 28  GLUCOSE 98 98  BUN 15 10  CREATININE 1.12 1.08  CALCIUM 8.9 9.0     PT/INR:  No results for input(s): LABPROT, INR in the last 72 hours.  CBG (last 3)  No results for input(s): GLUCAP in the last 72 hours.  ABG    Component Value Date/Time   PHART 7.384 06/19/2010 0231   PCO2ART 35.8 06/19/2010 0231   PO2ART 111.0 (H) 06/19/2010 0231   HCO3 21.2 06/19/2010 0231   TCO2 23 06/19/2010 1644   ACIDBASEDEF 3.0 (H) 06/19/2010 0231   O2SAT 98.0 06/19/2010 0231    CXR: n/a  Assessment/Plan:  Overall stable and Hgb has remained stable since colonoscopy.  Remains in atrial flutter but heart rate under much better control. Overall patient looks and feels better  although he does report some congestion in his chest overnight.  Will check f/u chest x-ray this morning.  Discussed options at length with Carlos Norton including proceeding with redo mitral valve replacement during this hospitalization versus sending him home after DC cardioversion for a few weeks to make sure that his hemoglobin remained stable and to allow him to regain some strength. We briefly discussed expectations for his recovery following surgery and he informed me regarding several matters that he is contending with at home including the illness of his daughter and the fact that his wife will be starting radiation therapy within a few weeks.  The patient states that his wife is visiting with his daughter this morning and will be available for consultation later this afternoon. I will plan to follow-up then to discuss options further.  Purcell Nails, MD 09/07/2016 8:48 AM    Addendum:  I have discussed options again at length with the patient and his wife this evening.  Carlos Norton wants to proceed with surgery during this hospitalization.  They understand concerns regarding what might happen if he were to develop recurrent GI bleeding during the early postoperative period of time as well as the fact that he will need to be anticoagulated using warfarin.  We reviewed the indications, risks and potential benefits of redo mitral valve replacement.  Under the circumstances we discussed the reasons for replacing his valve using a bioprosthetic tissue valve.  The associated risks of late structural valve deterioration and failure were reviewed.  We discussed plans to reassess the severity of tricuspid regurgitation at the time of surgery with possible tricuspid valve repair if felt indicated.  Expectations for his postoperative convalescence were discussed.  They understand and accept all potential risks of surgery including but not limited to risk of death, stroke or other neurologic complication,  myocardial infarction, congestive heart failure, respiratory failure, renal failure, bleeding requiring transfusion and/or reexploration, arrhythmia, infection or other wound complications, pneumonia, pleural and/or pericardial effusion, pulmonary embolus, aortic dissection or other major vascular complication, or delayed complications related to valve repair or replacement including but not limited to structural valve deterioration and failure, thrombosis, embolization, endocarditis, or paravalvular leak.  All of their questions have been answered.  We tentatively plan to proceed with surgery on Wednesday 09/09/2016   I spent in excess of 30 minutes during the conduct of this hospital encounter and >50% of this time involved direct face-to-face encounter with the patient for counseling and/or coordination of their care.     Purcell Nailslarence H Brevan Luberto, MD 09/07/2016 6:21 PM

## 2016-09-07 NOTE — Progress Notes (Signed)
ANTICOAGULATION CONSULT NOTE  Pharmacy Consult for heparin Indication: atrial fibrillation  No Known Allergies  Patient Measurements: Height: 5\' 11"  (180.3 cm) Weight: 154 lb 1.6 oz (69.9 kg) IBW/kg (Calculated) : 75.3 Heparin Dosing Weight: 68 kg  Vital Signs: Temp: 97.9 F (36.6 C) (10/09 1204) Temp Source: Oral (10/09 1204) BP: 91/74 (10/09 1204) Pulse Rate: 95 (10/09 1204)  Labs:  Recent Labs  09/05/16 0244 09/05/16 1429  09/06/16 0609 09/07/16 0251 09/07/16 1116  HGB 7.9*  --   --  8.2* 8.1*  --   HCT 28.5*  --   --  29.7* 28.8*  --   PLT 205  --   --  138* 138*  --   APTT  --  54*  --   --   --   --   HEPARINUNFRC  --  0.14*  < > 0.30 <0.10* <0.10*  CREATININE 1.37*  --   --  1.12 1.08  --   < > = values in this interval not displayed.  Estimated Creatinine Clearance: 60.2 mL/min (by C-G formula based on SCr of 1.08 mg/dL).   Marland Kitchen. amiodarone 30 mg/hr (09/07/16 0140)  . heparin 1,250 Units/hr (09/07/16 1400)     Assessment: 75 yo male admitted with atrial flutter/RVR and marked anemia.  On apixaban PTA, which has been held since admission.  On IV heparin 10/2, which was subsequently held due to GI bleeding.  S/p GI intervention 10/5.  Hemoglobin relatively stable now, and pharmacy asked to resume IV heparin with no bolus.   No bleeding noted, Hgb remains low but stable.  Platelet count with slight decline. Heparin level was at goal yesterday but today despite rate increase HL has been undetectable x2.  RN and I checked IV lines - running without problems or infiltration.  Increase drip rate.    Goal of Therapy:  Heparin level 0.3-0.5 - will target lower end given recent GIB Monitor platelets by anticoagulation protocol: Yes   Plan:  Increase heparin drip 1400 uts/hr Check HL in 6hr from rate increase Daily heparin level. F/u plans to transition back to oral anticoagulation when able post op  Leota SauersLisa Cyris Maalouf Pharm.D. CPP, BCPS Clinical  Pharmacist [phone] 09/07/2016 3:05 PM

## 2016-09-07 NOTE — Progress Notes (Signed)
ANTICOAGULATION CONSULT NOTE  Pharmacy Consult for heparin Indication: atrial fibrillation  No Known Allergies  Patient Measurements: Height: 5\' 11"  (180.3 cm) Weight: 154 lb 1.6 oz (69.9 kg) IBW/kg (Calculated) : 75.3 Heparin Dosing Weight: 68 kg  Vital Signs: Temp: 97.8 F (36.6 C) (10/09 2011) Temp Source: Oral (10/09 2011) BP: 97/73 (10/09 2011) Pulse Rate: 110 (10/09 2011)  Labs:  Recent Labs  09/05/16 0244 09/05/16 1429  09/06/16 0609 09/07/16 0251 09/07/16 1116 09/07/16 2042  HGB 7.9*  --   --  8.2* 8.1*  --   --   HCT 28.5*  --   --  29.7* 28.8*  --   --   PLT 205  --   --  138* 138*  --   --   APTT  --  54*  --   --   --   --   --   HEPARINUNFRC  --  0.14*  < > 0.30 <0.10* <0.10* <0.10*  CREATININE 1.37*  --   --  1.12 1.08  --   --   < > = values in this interval not displayed.  Estimated Creatinine Clearance: 60.2 mL/min (by C-G formula based on SCr of 1.08 mg/dL).   Marland Kitchen. amiodarone 30 mg/hr (09/07/16 2000)  . heparin 1,400 Units/hr (09/07/16 2000)     Assessment: 75 yo male admitted with atrial flutter/RVR and marked anemia.  On apixaban PTA, which has been held since admission.  On IV heparin 10/2, which was subsequently held due to GI bleeding.  S/p GI intervention 10/5.  Hemoglobin relatively stable now, and pharmacy asked to resume IV heparin with no bolus.   -heparin level remains below goal on 1400 units/hr  Goal of Therapy:  Heparin level 0.3-0.5 - will target lower end given recent GIB Monitor platelets by anticoagulation protocol: Yes   Plan:  -Increase heparin to 1550 units/hr -heparin level in 8 hrs  Harland GermanAndrew Terreon Ekholm, Pharm D 09/07/2016 10:38 PM

## 2016-09-07 NOTE — Progress Notes (Signed)
Nutrition Follow-up  DOCUMENTATION CODES:   Non-severe (moderate) malnutrition in context of chronic illness  INTERVENTION:    Continue snacks TID between meals.  NUTRITION DIAGNOSIS:   Malnutrition related to chronic illness as evidenced by mild depletion of body fat, moderate depletion of body fat, mild depletion of muscle mass, moderate depletions of muscle mass, percent weight loss.  Ongoing  GOAL:   Patient will meet greater than or equal to 90% of their needs  Met with meals and snacks.  MONITOR:   PO intake, Supplement acceptance, Labs, Weight trends, Skin, I & O's  ASSESSMENT:   74 yo male with PMH of rheumatic heart disease, coronary artery disease status post PCI and stenting in the past, atrial fibrillation, and COPD who was seen in the office today by Dr. Roxy Manns and noted to be in atrial fibrillation. Was sent to [facility] for direct admission.   Patient reports good intake of meals for the past 2-3 days, consuming 100% of meals. He is receiving snacks between meals from unit nourishment stock. Patient continues to decline PO supplements. Labs reviewed. Medications reviewed and include Colace & Lasix.   Diet Order:  Diet Heart Room service appropriate? Yes; Fluid consistency: Thin  Skin:  Reviewed, no issues  Last BM:  10/5  Height:   Ht Readings from Last 1 Encounters:  09/05/16 5' 11"  (1.803 m)    Weight:   Wt Readings from Last 1 Encounters:  09/07/16 154 lb 1.6 oz (69.9 kg)    Ideal Body Weight:  78.2 kg  BMI:  Body mass index is 21.49 kg/m.  Estimated Nutritional Needs:   Kcal:  2000-2200  Protein:  100-115 grams  Fluid:  2.0-2.2 L  EDUCATION NEEDS:   Education needs addressed  Molli Barrows, Alicia, [place], [place] Pager [phone] After Hours Pager [phone]

## 2016-09-07 NOTE — Progress Notes (Signed)
ANTICOAGULATION CONSULT NOTE - Follow Up Consult  Pharmacy Consult for heparin Indication: atrial fibrillation  Labs:  Recent Labs  09/05/16 0244  09/05/16 1429 09/05/16 2227 09/06/16 0609 09/07/16 0251  HGB 7.9*  --   --   --  8.2* 8.1*  HCT 28.5*  --   --   --  29.7* 28.8*  PLT 205  --   --   --  138* 138*  APTT  --   --  54*  --   --   --   HEPARINUNFRC  --   < > 0.14* 0.28* 0.30 <0.10*  CREATININE 1.37*  --   --   --  1.12 1.08  < > = values in this interval not displayed.  Assessment: 75yo male now subtherapeutic on heparin after one level at goal; no signs of bleeding per RN.  Goal of Therapy:  Heparin level 0.3-0.5 units/ml   Plan:  Will increase heparin gtt by 2 units/kg/hr to 1250 units/hr and check level in 6-8hr.  Carlos Norton, PharmD, BCPS  09/07/2016,4:15 AM

## 2016-09-07 NOTE — Progress Notes (Signed)
CARDIAC REHAB PHASE I   PRE:  Rate/Rhythm: 97 aflutter  BP:  Supine:   Sitting: 104/69  Standing:    SaO2: 100% 2L  MODE:  Ambulation: 310 ft   POST:  Rate/Rhythm: 106 aflutter  BP:  Supine:   Sitting: 121/73  Standing:    SaO2: 96%2L 1405-1450 Pt walked 310 ft on 2L with rolling walker and asst x 1 with steady gait. Tired by end of walk. To recliner. Discussed with pt and his wife sternal precautions, importance of IS and mobility after surgery. Wife will be available 24/7 after discharge home. Pt stated he wants to have surgery before leaving hospital and is going to discuss with Dr Cornelius Moraswen today. Put on pre op video and gave [facility] booklet and care guide to pt.    Carlos Nuttingharlene Tatum Corl, RN BSN  09/07/2016 2:46 PM

## 2016-09-07 NOTE — Progress Notes (Signed)
Patient ID: Carlos Norton, male   DOB: Aug 06, 1941, 75 y.o.   MRN: [id number]   ADVANCED HF ROUNDING NOTE  75 yo with history of rheumatic heart disease, CAD s/p PCI LCx/RCA, paroxysmal atrial fibrillation, COPD, and prior GI bleeding was admitted with atrial flutter/RVR and noted to be markedly anemic.   Patient had minimally invasive MV repair + Maze in 2011, this was complicated by GI bleeding (source uncertain, do not have records).  He was eventually taken off anticoagulation.  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  Since that time, he has been on Eliquis 2.5 bid + ASA 81 daily.  No melena or BRBPR noted.  He developed progressive exertional dyspnea over the last 2 months.  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  He was referred to Dr Cornelius Moraswen for mitral valve replacement.  Plan for MVR next week.  Seen at CVTS office 10/2, noted to be tachycardic around 150 and more short of breath, directly admitted.  [facility] noted to be in atrial flutter with RVR.  CBC returned with hgb 6.2.  He was initially started on amiodarone gtt + heparin gtt, heparin stopped with low hemoglobin.   2 units PRBCs on 10/3.   Lasix on hold due to hypotension 09/04/16. SBP 90-100s.  Feeling good today. Appetite continues to improve.  Walking halls without dizziness or DOE.  Daughter being admitted to Hospice with metastatic pancreatic CA. Wife being treated for breast CA.   Remains on amiodarone gtt with HR down to 90-105, still atrial flutter.  Heparin restarted 10/7.   Hemoglobin 8.7 => 8.1 => 7.8 => 8.4 => 7.9 => 8.2 => 8.1.   No BRBPR/melena.   EGD/small bowel enteroscopy 10/4: Erosion in stomach with stigmata of recent bleeding.   Colonoscopy 10/5: 5 colonic AVMs with oozing, APC + clip.  Likely this was bleeding source.    Scheduled Meds: . sodium chloride    Intravenous Once  . atorvastatin  20 mg Oral q1800  . docusate sodium  100 mg Oral Daily  . mometasone-formoterol  2 puff Inhalation BID  . pantoprazole  40 mg Oral BID  . tiotropium  18 mcg Inhalation Daily   Continuous Infusions: . amiodarone 30 mg/hr (09/07/16 0140)  . heparin 1,250 Units/hr (09/07/16 0431)   PRN Meds:.acetaminophen, ondansetron (ZOFRAN) IV   Vitals:   09/07/16 0000 09/07/16 0400 09/07/16 0737 09/07/16 0738  BP: 98/71 102/74 102/69   Pulse:   96   Resp: (!) 25 (!) 25 (!) 25   Temp:   97.7 F (36.5 C)   TempSrc:   Oral   SpO2:   95% 95%  Weight:  154 lb 1.6 oz (69.9 kg)    Height:        Intake/Output Summary (Last 24 hours) at 09/07/16 0751 Last data filed at 09/07/16 0500  Gross per 24 hour  Intake            835.7 ml  Output              400 ml  Net            435.7 ml    LABS: Basic Metabolic Panel:  Recent Labs  91/47/8209/07/16 0609 09/07/16 0251  NA 134* 136  K 4.0 3.8  CL 100* 99*  CO2 26 28  GLUCOSE 98 98  BUN 15 10  CREATININE 1.12 1.08  CALCIUM 8.9 9.0   Liver Function Tests: No results for input(s): AST, ALT, ALKPHOS, BILITOT, PROT, ALBUMIN in the last 72 hours. No results for input(s): LIPASE, AMYLASE in the last 72 hours. CBC:  Recent Labs  09/06/16 0609 09/07/16 0251  WBC 8.4 7.7  HGB 8.2* 8.1*  HCT 29.7* 28.8*  MCV 77.1* 76.8*  PLT 138* 138*   Cardiac Enzymes: No results for input(s): CKTOTAL, CKMB, CKMBINDEX, TROPONINI in the last 72 hours. BNP: Invalid input(s): POCBNP D-Dimer: No results for input(s): DDIMER in the last 72 hours. Hemoglobin A1C: No results for input(s): HGBA1C in the last 72 hours. Fasting Lipid Panel: No results for input(s): CHOL, HDL, LDLCALC, TRIG, CHOLHDL, LDLDIRECT in the last 72 hours. Thyroid Function Tests: No results for input(s): TSH, T4TOTAL, T3FREE, THYROIDAB in the last 72 hours.  Invalid input(s): FREET3 Anemia Panel: No results for input(s): VITAMINB12, FOLATE, FERRITIN,  TIBC, IRON, RETICCTPCT in the last 72 hours.  RADIOLOGY: Dg Chest Port 1 View  Result Date: 09/01/2016 CLINICAL DATA:  Congestive heart failure. Shortness of breath . Congestive heart failure. Shortness breath. EXAM: PORTABLE CHEST 1 VIEW COMPARISON:  08/31/2016. FINDINGS: Cardiomegaly with diffuse bilateral pulmonary infiltrates and bilateral pleural effusions. 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 Exam stable prior exam. No pneumothorax. IMPRESSION: Persistent congestive heart failure with pulmonary edema bilateral pleural effusions again noted. No significant interim change. Electronically Signed   By: Maisie Fus  Register   On: 09/01/2016 07:02   Dg Chest Port 1 View  Result Date: 08/31/2016 CLINICAL DATA:  Atrial fibrillation, dyspnea EXAM: PORTABLE CHEST 1 VIEW COMPARISON:  04/09/2016 FINDINGS: There is cardiomegaly with pulmonary vascular redistribution consistent with CHF. Opacities are noted at the left lung base and periphery of the left lower thorax consistent with moderate layering effusion. Ovoid density in in the right mid thorax may represent fluid in the major fissure. Aortic atherosclerosis without aneurysm. No acute osseous abnormality. IMPRESSION: Cardiomegaly with CHF and bilateral pleural effusions left greater than right. Electronically Signed   By: Tollie Eth M.D.   On: 08/31/2016 23:42    PHYSICAL EXAM General: NAD Neck: JVP 7-8 cm, no thyromegaly or thyroid nodule.  Lungs: CTAB, normal effort CV: Nondisplaced PMI.  Heart tachy, regular S1/S2, no S3/S4, 1/6 SEM RUSB.  No edema with ted hose in place. Abdomen: Soft, NT, ND, no HSM. No bruits or masses. +BS  Neurologic: Alert and oriented x 3.  Psych: Normal affect. Extremities: No clubbing or cyanosis.   TELEMETRY: Reviewed personally, A flutter/Atach 90s.  ASSESSMENT AND PLAN: 75 yo with history of rheumatic heart disease, CAD s/p PCI LCx/RCA, paroxysmal atrial  fibrillation, COPD, and prior GI bleeding was admitted with atrial flutter/RVR and noted to be markedly anemic.  1. Acute on chronic diastolic CHF: Preserved EF on echo with severe mitral stenosis.  RHC at [place] with elevated PCWP and PA pressure.  Patient was volume overloaded on exam in setting of severe mitral stenosis and atrial flutter with RVR.  The rapid rate impairs the already difficult LV filling.    Our ability to cardiovert has curtailed by severe anemia and concern for GI bleeding.  - Volume status improved and stable. Weight up.  - Restart po lasix at 20 mg daily.   - HR 90s currently. Continue amiodarone gtt for now. 2. Hypotension/shock - Hemorrhagic vs cardiogenic.  - BP soft but stable. In 90s-100s.  3. Mitral valve disease: Rheumatic MV, s/p MV repair in 2011.  Most recent TEE at [facility] showed mild to moderate MR with severe mitral stenosis, mean gradient 14.   - Severe MS confirmed by echo 10/6 - Dr Cornelius Moras to perform MVR this week.  Back on heparin 4. Atrial flutter: With RVR.  Tolerates poorly with severe mitral stenosis.   - Remains on amio gtt for rate control. Difficulty to cardiovert with poor tolerance of Anticoags.  -He had a recent cardioembolic event (5/17 embolus to subclavian).  He had APC of colonic AVMs on 10/5.  Per GI, these were likely the source of bleeding and we should be able to resume anticoagulation. - Hgb stable. No evidence of recurrent bleeding. Tolerating heparin gtt.  - With stable rate, plan on deferring cardioversion and proceeding with MVR + MAZE procedure at time of surgery.   5. Anemia: Severe, hgb 6.1 at admission. Last hgb back in 7/17 was in the 9 range.  - Has hx of GI bleeding 2000 peri previous cardiac surgery.   - 2 units PRBCs 10/3 with hgb 8.7 => 8.1 => 7.8 => 8.4 => 7.9 => 8.2 -> 8.1.  S/p Feraheme. - Source of bleeding likely colonic AVMs per GI.   - Now back on heparin. Hgb stable as above.     Resuming po lasix at 20 mg  daily.  We will continue to follow for surgical optimization (prior to and post).  Graciella Freer, PA-C 09/07/2016 7:51 AM  Advanced Heart Failure Team Pager [phone] (M-F; 7a - 4p)  Please contact [facility] for night-coverage after hours (4p -7a ) and weekends on [email or web address]  Patient seen and examined with Otilio Saber, PA-C. We discussed all aspects of the encounter. I agree with the assessment and plan as stated above.   Remains stable. BP soft but acceptable. No bleeding on heparin. AF rate controlled. Agree with re-starting low-dose lasix. D/w Dr. Cornelius Moras. For MVR and Maze on Wednesday. (Wife and daughter have serious medical issues currently but patient feels they are stable enough for him to proceed with surgery as as possible.).   Bekim Werntz,MD 4:01 PM

## 2016-09-08 DIAGNOSIS — I340 Nonrheumatic mitral (valve) insufficiency: Secondary | ICD-10-CM

## 2016-09-08 LAB — CBC
HCT: 31.5 % — ABNORMAL LOW (ref 39.0–52.0)
Hemoglobin: 9 g/dL — ABNORMAL LOW (ref 13.0–17.0)
MCH: 21.9 pg — ABNORMAL LOW (ref 26.0–34.0)
MCHC: 28.6 g/dL — ABNORMAL LOW (ref 30.0–36.0)
MCV: 76.6 fL — ABNORMAL LOW (ref 78.0–100.0)
PLATELETS: 131 10*3/uL — AB (ref 150–400)
RBC: 4.11 MIL/uL — ABNORMAL LOW (ref 4.22–5.81)
RDW: 22.3 % — AB (ref 11.5–15.5)
WBC: 9.5 10*3/uL (ref 4.0–10.5)

## 2016-09-08 LAB — BLOOD GAS, ARTERIAL
ACID-BASE EXCESS: 4.8 mmol/L — AB (ref 0.0–2.0)
BICARBONATE: 28.3 mmol/L — AB (ref 20.0–28.0)
DRAWN BY: 437071
O2 CONTENT: 2 L/min
O2 SAT: 96 %
PATIENT TEMPERATURE: 98.6
pCO2 arterial: 38.6 mmHg (ref 32.0–48.0)
pH, Arterial: 7.478 — ABNORMAL HIGH (ref 7.350–7.450)
pO2, Arterial: 75.2 mmHg — ABNORMAL LOW (ref 83.0–108.0)

## 2016-09-08 LAB — COMPREHENSIVE METABOLIC PANEL
ALT: 17 U/L (ref 17–63)
ANION GAP: 10 (ref 5–15)
AST: 23 U/L (ref 15–41)
Albumin: 2.8 g/dL — ABNORMAL LOW (ref 3.5–5.0)
Alkaline Phosphatase: 120 U/L (ref 38–126)
BUN: 10 mg/dL (ref 6–20)
CHLORIDE: 100 mmol/L — AB (ref 101–111)
CO2: 25 mmol/L (ref 22–32)
Calcium: 9 mg/dL (ref 8.9–10.3)
Creatinine, Ser: 0.95 mg/dL (ref 0.61–1.24)
Glucose, Bld: 99 mg/dL (ref 65–99)
POTASSIUM: 4 mmol/L (ref 3.5–5.1)
Sodium: 135 mmol/L (ref 135–145)
Total Bilirubin: 0.8 mg/dL (ref 0.3–1.2)
Total Protein: 5.9 g/dL — ABNORMAL LOW (ref 6.5–8.1)

## 2016-09-08 LAB — PROTIME-INR
INR: 1.29
Prothrombin Time: 16.1 seconds — ABNORMAL HIGH (ref 11.4–15.2)

## 2016-09-08 LAB — TYPE AND SCREEN
ABO/RH(D): A POS
ANTIBODY SCREEN: NEGATIVE

## 2016-09-08 LAB — APTT: aPTT: >200 seconds (ref 24–36)

## 2016-09-08 LAB — PREALBUMIN: PREALBUMIN: 10 mg/dL — AB (ref 18–38)

## 2016-09-08 LAB — HEPARIN LEVEL (UNFRACTIONATED): Heparin Unfractionated: 0.83 IU/mL — ABNORMAL HIGH (ref 0.30–0.70)

## 2016-09-08 MED ORDER — APIXABAN 5 MG PO TABS
5.0000 mg | ORAL_TABLET | Freq: Two times a day (BID) | ORAL | Status: AC
  Administered 2016-09-08 (×2): 5 mg via ORAL
  Filled 2016-09-08 (×2): qty 1

## 2016-09-08 MED ORDER — APIXABAN 5 MG PO TABS: 5.0000 mg | ORAL_TABLET | Freq: Two times a day (BID) | ORAL | Status: DC

## 2016-09-08 MED ORDER — CIPROFLOXACIN HCL 500 MG PO TABS
500.0000 mg | ORAL_TABLET | Freq: Two times a day (BID) | ORAL | Status: DC
  Administered 2016-09-08 – 2016-09-09 (×3): 500 mg via ORAL
  Filled 2016-09-08 (×4): qty 1

## 2016-09-08 MED ORDER — FUROSEMIDE 20 MG PO TABS
20.0000 mg | ORAL_TABLET | Freq: Once | ORAL | Status: AC
Start: 2016-09-08 — End: 2016-09-08

## 2016-09-08 MED ORDER — APIXABAN 5 MG PO TABS
5.0000 mg | ORAL_TABLET | Freq: Once | ORAL | Status: AC
  Administered 2016-09-09: 5 mg via ORAL
  Filled 2016-09-08: qty 1

## 2016-09-08 MED ORDER — FUROSEMIDE 40 MG PO TABS
40.0000 mg | ORAL_TABLET | Freq: Every day | ORAL | Status: DC
  Administered 2016-09-09: 40 mg via ORAL
  Filled 2016-09-08: qty 1

## 2016-09-08 MED ORDER — AMIODARONE HCL 200 MG PO TABS
200.0000 mg | ORAL_TABLET | Freq: Two times a day (BID) | ORAL | Status: DC
  Administered 2016-09-08 – 2016-09-09 (×3): 200 mg via ORAL
  Filled 2016-09-08 (×3): qty 1

## 2016-09-08 NOTE — Progress Notes (Signed)
ANTICOAGULATION CONSULT NOTE - Follow Up Consult  Pharmacy Consult for heparin Indication: atrial fibrillation  Labs:  Recent Labs  09/05/16 1429  09/06/16 0609 09/07/16 0251 09/07/16 1116 09/07/16 2042 09/08/16 0458 09/08/16 0459  HGB  --   < > 8.2* 8.1*  --   --  9.0*  --   HCT  --   --  29.7* 28.8*  --   --  31.5*  --   PLT  --   --  138* 138*  --   --  131*  --   APTT 54*  --   --   --   --   --   --   --   LABPROT  --   --   --   --   --   --   --  16.1*  INR  --   --   --   --   --   --   --  1.29  HEPARINUNFRC 0.14*  < > 0.30 <0.10* <0.10* <0.10*  --  0.83*  CREATININE  --   --  1.12 1.08  --   --  0.95  --   < > = values in this interval not displayed.  Assessment: 75yo male now above goal on heparin after rate change; of note RN reports that she moved IV site in case undetectable levels were 2/2 poor IV site, running fine in new site.  Goal of Therapy:  Heparin level 0.3-0.5 units/ml   Plan:  Will decrease heparin gtt by 3 units/kg/hr to 1200 units/hr and check level in 6hr.  Vernard GamblesVeronda Sharmin Foulk, PharmD, BCPS  09/08/2016,6:12 AM

## 2016-09-08 NOTE — Progress Notes (Signed)
ANTICOAGULATION CONSULT NOTE  Pharmacy Consult for heparin >apixaban Indication: atrial fibrillation  No Known Allergies  Patient Measurements: Height: 5\' 11"  (180.3 cm) Weight: 159 lb (72.1 kg) IBW/kg (Calculated) : 75.3 Heparin Dosing Weight: 68 kg  Vital Signs: Temp: 97.8 F (36.6 C) (10/10 0800) Temp Source: Axillary (10/10 0800) BP: 102/75 (10/10 0800) Pulse Rate: 121 (10/10 0805)  Labs:  Recent Labs  09/05/16 1429  09/06/16 0609 09/07/16 0251 09/07/16 1116 09/07/16 2042 09/08/16 0458 09/08/16 0459  HGB  --   < > 8.2* 8.1*  --   --  9.0*  --   HCT  --   --  29.7* 28.8*  --   --  31.5*  --   PLT  --   --  138* 138*  --   --  131*  --   APTT 54*  --   --   --   --   --   --  >200*  LABPROT  --   --   --   --   --   --   --  16.1*  INR  --   --   --   --   --   --   --  1.29  HEPARINUNFRC 0.14*  < > 0.30 <0.10* <0.10* <0.10*  --  0.83*  CREATININE  --   --  1.12 1.08  --   --  0.95  --   < > = values in this interval not displayed.  Estimated Creatinine Clearance: 70.6 mL/min (by C-G formula based on SCr of 0.95 mg/dL).       Assessment: 75 yo male admitted with atrial flutter/RVR and marked anemia.  On apixaban PTA, which has been held since admission.  On IV heparin 10/2, which was subsequently held due to GI bleeding.  S/p GI intervention 10/5.  Hemoglobin relatively stable now, and resumed IV heparin with no bolus.   -heparin level supratherapeutic this am after IV site changed  Plan to DC home and schedule OR in a few weeks Cr 0.9 Wt 70kg age < 82  Goal of Therapy:  Heparin level 0.3-0.5 - will target lower end given recent GIB Monitor platelets by anticoagulation protocol: Yes   Plan:  Stop heparin  apixaban 5mg  BID   Leota SauersLisa Eual Lindstrom Pharm.D. CPP, BCPS Clinical Pharmacist [phone] 09/08/2016 9:16 AM

## 2016-09-08 NOTE — Progress Notes (Signed)
[address]       Carlos Norton [address]             [phone]        CARDIOTHORACIC SURGERY PROGRESS NOTE   R5 Days Post-Op Procedure(s) (LRB): COLONOSCOPY (N/A)  Subjective: No complaints.  Denies dysuria  Objective: Vital signs: BP Readings from Last 1 Encounters:  09/08/16 96/70   Pulse Readings from Last 1 Encounters:  09/08/16 (!) 118   Resp Readings from Last 1 Encounters:  09/08/16 (!) 29   Temp Readings from Last 1 Encounters:  09/08/16 98 F (36.7 C) (Oral)    Hemodynamics:    Physical Exam:  Rhythm:   Aflutter  Breath sounds: Few bibasilar crackles  Heart sounds:  RRR  Incisions:  n/a  Abdomen:  Soft, non-distended, non-tender  Extremities:  Warm, well-perfused   Intake/Output from previous day: 10/09 0701 - 10/10 0700 In: 996.6 [P.O.:350; I.V.:646.6] Out: 700 [Urine:700] Intake/Output this shift: No intake/output data recorded.  Lab Results:  CBC: Recent Labs  09/07/16 0251 09/08/16 0458  WBC 7.7 9.5  HGB 8.1* 9.0*  HCT 28.8* 31.5*  PLT 138* 131*    BMET:  Recent Labs  09/07/16 0251 09/08/16 0458  NA 136 135  K 3.8 4.0  CL 99* 100*  CO2 28 25  GLUCOSE 98 99  BUN 10 10  CREATININE 1.08 0.95  CALCIUM 9.0 9.0     PT/INR:   Recent Labs  09/08/16 0459  LABPROT 16.1*  INR 1.29    CBG (last 3)  No results for input(s): GLUCAP in the last 72 hours.  ABG    Component Value Date/Time   PHART 7.478 (H) 09/08/2016 0400   PCO2ART 38.6 09/08/2016 0400   PO2ART 75.2 (L) 09/08/2016 0400   HCO3 28.3 (H) 09/08/2016 0400   TCO2 23 06/19/2010 1644   ACIDBASEDEF 3.0 (H) 06/19/2010 0231   O2SAT 96.0 09/08/2016 0400    CXR: CHEST  2 VIEW  COMPARISON:  09/01/2016  FINDINGS: Cardiac shadow is enlarged. Left-sided pleural effusion is again noted and stable. A smaller right-sided effusion is noted as well. There also appears to be some fluid within the major fissure on the right stable from the prior  exam degree of vascular congestion has improved. Persistent bibasilar atelectasis/infiltrate is noted.  IMPRESSION: Stable bilateral pleural effusions.  Bibasilar changes worse on the left than the right   Electronically Signed   By: Alcide Clever M.D.   On: 09/07/2016 10:59    Results for Carlos, Norton (MRN [id number]) as of 09/08/2016 07:47  Ref. Range 09/07/2016 22:46  Appearance Latest Ref Range: CLEAR  CLOUDY (A)  Bacteria, UA Latest Ref Range: NONE SEEN  MANY (A)  Bilirubin Urine Latest Ref Range: NEGATIVE  NEGATIVE  Color, Urine Latest Ref Range: YELLOW  AMBER (A)  Crystals Latest Ref Range: NEGATIVE  CA OXALATE CRYSTALS (A)  Glucose Latest Ref Range: NEGATIVE mg/dL NEGATIVE  Hgb urine dipstick Latest Ref Range: NEGATIVE  SMALL (A)  Ketones, ur Latest Ref Range: NEGATIVE mg/dL NEGATIVE  Leukocytes, UA Latest Ref Range: NEGATIVE  LARGE (A)  Nitrite Latest Ref Range: NEGATIVE  POSITIVE (A)  pH Latest Ref Range: 5.0 - 8.0  5.0  Protein Latest Ref Range: NEGATIVE mg/dL NEGATIVE  RBC / HPF Latest Ref Range: 0 - 5 RBC/hpf 0-5  Specific Gravity, Urine Latest Ref Range: 1.005 - 1.030  1.024  Squamous Epithelial / LPF Latest Ref Range: NONE SEEN  0-5 (A)  WBC, UA Latest Ref Range: 0 - 5 WBC/hpf 6-30     Assessment/Plan:  Carlos Norton' urinalysis is markedly abnormal and c/w likely UTI.  Under the circumstances I feel that we have no choice but to postpone surgery.  Will send cath urine for culture and start empiric Cipro.  Discussed options with Carlos Norton including whether or not to keep him in the hospital and consider rescheduling surgery for next week versus sending him home to allow him to recover more completely from his recent GI bleed and colonoscopy.  In addition, he had not been feeling well nor eating well for some time PTA and has significant protein depletion.  I think the most sensible and conservative option is to let him go home with plan to return for surgery in 2-3  weeks.  He prefers this option and wants to go home as soon as possible.  He might benefit from DCCV now that he has been loaded with amiodarone.  Will discuss w/ Dr. Gala RomneyBensimhon.  I spent in excess of 15 minutes during the conduct of this hospital encounter and >50% of this time involved direct face-to-face encounter with the patient for counseling and/or coordination of their care.    Carlos Nailslarence H Owen, MD 09/08/2016 7:47 AM

## 2016-09-08 NOTE — Progress Notes (Signed)
Patient ID: Carlos Norton, male   DOB: 1941/05/28, 75 y.o.   MRN: [id number]   ADVANCED HF ROUNDING NOTE  75 yo with history of rheumatic heart disease, CAD s/p PCI LCx/RCA, paroxysmal atrial fibrillation, COPD, and prior GI bleeding was admitted with atrial flutter/RVR and noted to be markedly anemic.   Patient had minimally invasive MV repair + Maze in 2011, this was complicated by GI bleeding (source uncertain, do not have records).  He was eventually taken off anticoagulation.  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  Since that time, he has been on Eliquis 2.5 bid + ASA 81 daily.  No melena or BRBPR noted.  He developed progressive exertional dyspnea over the last 2 months.  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  He was referred to Dr Cornelius Moraswen for mitral valve replacement.  Plan for MVR next week.  Seen at CVTS office 10/2, noted to be tachycardic around 150 and more short of breath, directly admitted.  [facility], noted to be in atrial flutter with RVR.  CBC returned with hgb 6.2.  He was initially started on amiodarone gtt + heparin gtt, heparin stopped with low hemoglobin.   2 units PRBCs on 10/3.   Discussed 09/07/16. Daughter being admitted to Hospice with metastatic pancreatic CA. Wife being treated for breast CA. Pt wished to proceed with his surgery to "get it out of the way".  UA 09/07/16 significant for UTI.  Surgery postponed.   Creatinine and K stable. BPs remains soft in 80-90s. Feels OK. Frustrated surgery has to be postponed for UTI treatment, though anxious to get home for a time. Walking halls without difficulty. Appetite OK.   HR up 110-120s on amiodarone gtt. May be 2/2 to UTI. Heparin restarted 10/7.   Hemoglobin 8.7 => 8.1 => 7.8 => 8.4 => 7.9 => 8.2 => 8.1 => 9.0.  No bleeding.    EGD/small bowel enteroscopy 10/4:  Erosion in stomach with stigmata of recent bleeding.   Colonoscopy 10/5: 5 colonic AVMs with oozing, APC + clip.  Likely this was bleeding source.    Scheduled Meds: . sodium chloride   Intravenous Once  . atorvastatin  20 mg Oral q1800  . ciprofloxacin  500 mg Oral BID  . docusate sodium  100 mg Oral Daily  . furosemide  20 mg Oral Daily  . mometasone-formoterol  2 puff Inhalation BID  . pantoprazole  40 mg Oral BID  . tiotropium  18 mcg Inhalation Daily   Continuous Infusions: . amiodarone 30 mg/hr (09/08/16 78290614)  . heparin 1,200 Units/hr (09/08/16 [id number])   PRN Meds:.acetaminophen, ondansetron (ZOFRAN) IV   Vitals:   09/07/16 2340 09/08/16 0030 09/08/16 0416 09/08/16 0717  BP: (!) 88/66 96/69 96/70    Pulse: (!) 118 (!) 118 (!) 118   Resp: (!) 27 (!) 25 (!) 29   Temp: 98 F (36.7 C)  98 F (36.7 C)   TempSrc: Axillary  Oral   SpO2: 97% 96% 98% 97%  Weight:   159 lb (72.1 kg)   Height:        Intake/Output Summary (Last 24 hours) at 09/08/16 0801 Last data filed at 09/08/16 0614  Gross per 24 hour  Intake           979.87 ml  Output  700 ml  Net           279.87 ml    LABS: Basic Metabolic Panel:  Recent Labs  16/10/96 0251 09/08/16 0458  NA 136 135  K 3.8 4.0  CL 99* 100*  CO2 28 25  GLUCOSE 98 99  BUN 10 10  CREATININE 1.08 0.95  CALCIUM 9.0 9.0   Liver Function Tests:  Recent Labs  09/08/16 0458  AST 23  ALT 17  ALKPHOS 120  BILITOT 0.8  PROT 5.9*  ALBUMIN 2.8*   No results for input(s): LIPASE, AMYLASE in the last 72 hours. CBC:  Recent Labs  09/07/16 0251 09/08/16 0458  WBC 7.7 9.5  HGB 8.1* 9.0*  HCT 28.8* 31.5*  MCV 76.8* 76.6*  PLT 138* 131*   Cardiac Enzymes: No results for input(s): CKTOTAL, CKMB, CKMBINDEX, TROPONINI in the last 72 hours. BNP: Invalid input(s): POCBNP D-Dimer: No results for input(s): DDIMER in the last 72 hours. Hemoglobin A1C: No results for input(s): HGBA1C in the last 72  hours. Fasting Lipid Panel: No results for input(s): CHOL, HDL, LDLCALC, TRIG, CHOLHDL, LDLDIRECT in the last 72 hours. Thyroid Function Tests: No results for input(s): TSH, T4TOTAL, T3FREE, THYROIDAB in the last 72 hours.  Invalid input(s): FREET3 Anemia Panel: No results for input(s): VITAMINB12, FOLATE, FERRITIN, TIBC, IRON, RETICCTPCT in the last 72 hours.  RADIOLOGY: Dg Chest 2 View  Result Date: 09/07/2016 CLINICAL DATA:  Cough and shortness of Breath EXAM: CHEST  2 VIEW COMPARISON:  09/01/2016 FINDINGS: Cardiac shadow is enlarged. Left-sided pleural effusion is again noted and stable. A smaller right-sided effusion is noted as well. There also appears to be some fluid within the major fissure on the right stable from the prior exam degree of vascular congestion has improved. Persistent bibasilar atelectasis/infiltrate is noted. IMPRESSION: Stable bilateral pleural effusions. Bibasilar changes worse on the left than the right Electronically Signed   By: Alcide Clever M.D.   On: 09/07/2016 10:59   Dg Chest Port 1 View  Result Date: 09/01/2016 CLINICAL DATA:  Congestive heart failure. Shortness of breath . Congestive heart failure. Shortness breath. EXAM: PORTABLE CHEST 1 VIEW COMPARISON:  08/31/2016. FINDINGS: Cardiomegaly with diffuse bilateral pulmonary infiltrates and bilateral pleural effusions. 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 Exam stable prior exam. No pneumothorax. IMPRESSION: Persistent congestive heart failure with pulmonary edema bilateral pleural effusions again noted. No significant interim change. Electronically Signed   By: Maisie Fus  Register   On: 09/01/2016 07:02   Dg Chest Port 1 View  Result Date: 08/31/2016 CLINICAL DATA:  Atrial fibrillation, dyspnea EXAM: PORTABLE CHEST 1 VIEW COMPARISON:  04/09/2016 FINDINGS: There is cardiomegaly with pulmonary vascular redistribution consistent with CHF. Opacities are noted at  the left lung base and periphery of the left lower thorax consistent with moderate layering effusion. Ovoid density in in the right mid thorax may represent fluid in the major fissure. Aortic atherosclerosis without aneurysm. No acute osseous abnormality. IMPRESSION: Cardiomegaly with CHF and bilateral pleural effusions left greater than right. Electronically Signed   By: Tollie Eth M.D.   On: 08/31/2016 23:42    PHYSICAL EXAM General: NAD, sitting up in chair.  Neck: JVP 7-8 cm, no thyromegaly or thyroid nodule.  Lungs: Clear normal effort CV: Nondisplaced PMI.  Heart tachy, regular S1/S2, no S3/S4, 1/6 SEM RUSB.  No edema. Ted hose present Abdomen: Soft, NT, ND, no HSM. No bruits or masses. +BS  Neurologic: Alert  and oriented x 3.  Psych: Normal affect. Extremities: No clubbing or cyanosis.   TELEMETRY: Reviewed, Aflutter/Atach 110s this am.  ASSESSMENT AND PLAN: 75 yo with history of rheumatic heart disease, CAD s/p PCI LCx/RCA, paroxysmal atrial fibrillation, COPD, and prior GI bleeding was admitted with atrial flutter/RVR and noted to be markedly anemic.  1. Acute on chronic diastolic CHF: Preserved EF on echo with severe mitral stenosis.  RHC at [place] with elevated PCWP and PA pressure.  Patient was volume overloaded on exam in setting of severe mitral stenosis and atrial flutter with RVR.  The rapid rate impairs the already difficult LV filling.    Our ability to cardiovert has curtailed by severe anemia and concern for GI bleeding.  - Volume status going up  - Increase lasix to 40 mg daily.   - HR elevated again this am. ? Due to UTI.  2. Hypotension/shock - Hemorrhagic vs cardiogenic.  - BPs remain soft but stable.  3. Mitral valve disease: Rheumatic MV, s/p MV repair in 2011.  Most recent TEE at [facility] showed mild to moderate MR with severe mitral stenosis, mean gradient 14.   - Severe MS confirmed by echo 10/6 - Dr Cornelius Moras to perform MVR. Now has to be deferred with UTI.   4. Atrial flutter: With RVR.  Tolerates poorly with severe mitral stenosis.   - Remains on amio gtt for rate control. Difficulty to cardiovert with poor tolerance of Anticoags.  -He had a recent cardioembolic event (5/17 embolus to subclavian).  He had APC of colonic AVMs on 10/5.  Per GI, these were likely the source of bleeding and we should be able to resume anticoagulation. - Hgb stable. No evidence of recurrent bleeding. Tolerating heparin gtt.  - With stable rate, plan on deferring cardioversion and proceeding with MVR + MAZE 09/09/16. 5. Anemia: Severe, hgb 6.1 at admission. Last hgb back in 7/17 was in the 9 range.  - Has hx of GI bleeding 2000 peri previous cardiac surgery.   - 2 units PRBCs 10/3 with hgb 8.7 => 8.1 => 7.8 => 8.4 => 7.9 => 8.2 -> 8.1 => 9.0. - Source of bleeding likely colonic AVMs per GI.   - Remains on heparin. Hgb stable as above.  6. UTI - On cipro. Surgery postponed.     Surgery postponed. Likely home in next day or two and to return in a few weeks for MVR + Maze.  We will follow up closely in HF clinic.  Needs to be transitioned to po meds.  Will discuss timing with MD. Could consider DCCV now that pt not undergoing surgery immediately.  Per Endo next available spot 09/10/16 ( barring any cancellations on 09/09/16)   Carlos Freer, PA-C 09/08/2016 8:01 AM  Advanced Heart Failure Team Pager [phone] (M-F; 7a - 4p)  Please contact [facility] for night-coverage after hours (4p -7a ) and weekends on [email or web address]  Patient seen and examined with Otilio Saber, PA-C. We discussed all aspects of the encounter. I agree with the assessment and plan as stated above.   Patient found to have UTI so surgery postponed. HR and volume status up again today. Will increase lasix. Will switch heparin to Eliquis and plan DC-CV tomorrow if there is a spot (discussed with Endo). Will switch amio to 200 bid. Hgb stable.  Navarro Nine,MD 9:24 AM

## 2016-09-08 NOTE — Progress Notes (Signed)
CRITICAL VALUE ALERT  Critical value received:  PTT >200seconds  Date of notification:  09/08/16  Time of notification:  0637  Critical value read back:yes  Nurse who received alert:  Iona CoachSuzanna Aubry Tucholski, RN  MD notified (1st page):  Vernard GamblesVeronda Bryk, PharmD  Time of first page:  [phone]  MD notified (2nd page):  Time of second page:  Responding MD:  Vernard GamblesVeronda Bryk, PharmD  Time MD responded:  [phone]

## 2016-09-08 NOTE — Progress Notes (Signed)
In and out catheter completed per MD order by Garnette CzechLexi Potter RN and Smith RobertErin Moore RN. Urine culture sent to lab. Patient tolerated procedure well. Will continue to monitor.

## 2016-09-09 ENCOUNTER — Encounter (HOSPITAL_COMMUNITY): Payer: Medicare Other

## 2016-09-09 ENCOUNTER — Encounter (HOSPITAL_COMMUNITY)
Admission: AD | Disposition: A | Discharge status: Home / Self Care | Payer: Self-pay | Source: Ambulatory Visit | Attending: Cardiology

## 2016-09-09 ENCOUNTER — Other Ambulatory Visit (HOSPITAL_COMMUNITY): Payer: Medicare Other

## 2016-09-09 ENCOUNTER — Inpatient Hospital Stay: Admit: 2016-09-09 | Payer: Medicare Other | Admitting: Thoracic Surgery (Cardiothoracic Vascular Surgery)

## 2016-09-09 ENCOUNTER — Encounter (HOSPITAL_COMMUNITY): Payer: Self-pay | Admitting: *Deleted

## 2016-09-09 ENCOUNTER — Inpatient Hospital Stay (HOSPITAL_COMMUNITY): Payer: Medicare Other | Admitting: Certified Registered Nurse Anesthetist

## 2016-09-09 ENCOUNTER — Inpatient Hospital Stay (HOSPITAL_COMMUNITY): Payer: Medicare Other

## 2016-09-09 ENCOUNTER — Other Ambulatory Visit: Payer: Self-pay | Admitting: *Deleted

## 2016-09-09 DIAGNOSIS — I4892 Unspecified atrial flutter: Secondary | ICD-10-CM

## 2016-09-09 DIAGNOSIS — I340 Nonrheumatic mitral (valve) insufficiency: Secondary | ICD-10-CM

## 2016-09-09 DIAGNOSIS — I350 Nonrheumatic aortic (valve) stenosis: Secondary | ICD-10-CM

## 2016-09-09 HISTORY — PX: TEE WITHOUT CARDIOVERSION: SHX5443

## 2016-09-09 HISTORY — PX: CARDIOVERSION: SHX1299

## 2016-09-09 LAB — CBC
HEMATOCRIT: 28.9 % — AB (ref 39.0–52.0)
Hemoglobin: 8.2 g/dL — ABNORMAL LOW (ref 13.0–17.0)
MCH: 21.9 pg — ABNORMAL LOW (ref 26.0–34.0)
MCHC: 28.4 g/dL — ABNORMAL LOW (ref 30.0–36.0)
MCV: 77.1 fL — ABNORMAL LOW (ref 78.0–100.0)
Platelets: 145 10*3/uL — ABNORMAL LOW (ref 150–400)
RBC: 3.75 MIL/uL — AB (ref 4.22–5.81)
RDW: 23.3 % — ABNORMAL HIGH (ref 11.5–15.5)
WBC: 7.6 10*3/uL (ref 4.0–10.5)

## 2016-09-09 LAB — BASIC METABOLIC PANEL
Anion gap: 9 (ref 5–15)
BUN: 10 mg/dL (ref 6–20)
CHLORIDE: 100 mmol/L — AB (ref 101–111)
CO2: 27 mmol/L (ref 22–32)
Calcium: 9 mg/dL (ref 8.9–10.3)
Creatinine, Ser: 1.06 mg/dL (ref 0.61–1.24)
GFR calc non Af Amer: >60 mL/min (ref >60)
Glucose, Bld: 89 mg/dL (ref 65–99)
POTASSIUM: 4.1 mmol/L (ref 3.5–5.1)
SODIUM: 136 mmol/L (ref 135–145)

## 2016-09-09 LAB — URINE CULTURE: Culture: NO GROWTH

## 2016-09-09 LAB — HEMOGLOBIN A1C
HEMOGLOBIN A1C: 5.1 % (ref 4.8–5.6)
MEAN PLASMA GLUCOSE: 100 mg/dL

## 2016-09-09 SURGERY — ECHOCARDIOGRAM, TRANSESOPHAGEAL
Anesthesia: Moderate Sedation

## 2016-09-09 SURGERY — CARDIOVERSION
Anesthesia: Monitor Anesthesia Care

## 2016-09-09 SURGERY — REPLACEMENT, MITRAL VALVE, REPEAT
Anesthesia: General | Site: Chest

## 2016-09-09 MED ORDER — FUROSEMIDE 20 MG PO TABS: ORAL_TABLET | ORAL | 6 refills | Status: DC

## 2016-09-09 MED ORDER — SODIUM CHLORIDE 0.9% FLUSH: 3.0000 mL | INTRAVENOUS | Status: DC | PRN

## 2016-09-09 MED ORDER — POTASSIUM CHLORIDE ER 10 MEQ PO TBCR: 20.0000 meq | EXTENDED_RELEASE_TABLET | Freq: Every day | ORAL | 6 refills | Status: DC

## 2016-09-09 MED ORDER — PHENYLEPHRINE HCL 10 MG/ML IJ SOLN
INTRAMUSCULAR | Status: DC | PRN
  Administered 2016-09-09 (×7): 120 ug via INTRAVENOUS
  Administered 2016-09-09: 80 ug via INTRAVENOUS

## 2016-09-09 MED ORDER — PROPOFOL 10 MG/ML IV BOLUS
INTRAVENOUS | Status: DC | PRN
  Administered 2016-09-09: 30 mg via INTRAVENOUS
  Administered 2016-09-09: 20 mg via INTRAVENOUS
  Administered 2016-09-09 (×3): 30 mg via INTRAVENOUS
  Administered 2016-09-09: 20 mg via INTRAVENOUS

## 2016-09-09 MED ORDER — AMIODARONE HCL 200 MG PO TABS: ORAL_TABLET | ORAL | 6 refills | Status: DC

## 2016-09-09 MED ORDER — SODIUM CHLORIDE 0.9% FLUSH: 3.0000 mL | Freq: Two times a day (BID) | INTRAVENOUS | Status: DC

## 2016-09-09 MED ORDER — PANTOPRAZOLE SODIUM 40 MG PO TBEC: 40.0000 mg | DELAYED_RELEASE_TABLET | Freq: Two times a day (BID) | ORAL | 6 refills | Status: AC

## 2016-09-09 MED ORDER — LIDOCAINE HCL (CARDIAC) 20 MG/ML IV SOLN
INTRAVENOUS | Status: DC | PRN
  Administered 2016-09-09: 50 mg via INTRATRACHEAL

## 2016-09-09 MED ORDER — SODIUM CHLORIDE 0.9 % IV SOLN: INTRAVENOUS | Status: DC

## 2016-09-09 MED ORDER — AMIODARONE HCL 200 MG PO TABS: 200.0000 mg | ORAL_TABLET | Freq: Two times a day (BID) | ORAL | 6 refills | Status: DC

## 2016-09-09 MED ORDER — CIPROFLOXACIN HCL 500 MG PO TABS: 500.0000 mg | ORAL_TABLET | Freq: Two times a day (BID) | ORAL | 0 refills | Status: AC

## 2016-09-09 MED ORDER — SODIUM CHLORIDE 0.9 % IV SOLN
250.0000 mL | INTRAVENOUS | Status: DC
  Administered 2016-09-09: 10:00:00 via INTRAVENOUS

## 2016-09-09 MED ORDER — BUTAMBEN-TETRACAINE-BENZOCAINE 2-2-14 % EX AERO
INHALATION_SPRAY | CUTANEOUS | Status: DC | PRN
  Administered 2016-09-09: 2 via TOPICAL

## 2016-09-09 MED ORDER — APIXABAN 5 MG PO TABS: 5.0000 mg | ORAL_TABLET | Freq: Two times a day (BID) | ORAL | 6 refills | Status: DC

## 2016-09-09 NOTE — Transfer of Care (Signed)
Immediate Anesthesia Transfer of Care Note  Patient: Carlos Norton  Procedure(s) Performed: Procedure(s): CARDIOVERSION (N/A) TRANSESOPHAGEAL ECHOCARDIOGRAM (TEE) (N/A)  Patient Location: Endoscopy Unit  Anesthesia Type:MAC  Level of Consciousness: awake, alert  and oriented  Airway & Oxygen Therapy: Patient Spontanous Breathing and Patient connected to nasal cannula oxygen  Post-op Assessment: Report given to RN, Post -op Vital signs reviewed and stable and Patient moving all extremities X 4  Post vital signs: Reviewed and stable  Last Vitals:  Vitals:   09/09/16 0756 09/09/16 0924  BP: 105/77 93/73  Pulse: (!) 112 (!) 109  Resp: (!) 26 (!) 25  Temp: 36.6 C 36.7 C    Last Pain:  Vitals:   09/09/16 0924  TempSrc: Oral  PainSc:          Complications: No apparent anesthesia complications

## 2016-09-09 NOTE — Care Management Note (Signed)
Case Management Note  Patient Details  Name: Carlos Norton MRN: [id number] Date of Birth: 05/24/1941  Subjective/Objective:     CHF,  Aflutter/RVR, MVR/Maze scheduled 10/01/2016               Action/Plan: Discharge Planning: AVS reviewed:   NCM spoke to pt and wife at bedside. Pt can afford medications. He was independent pta. Wife at home to assist as needed.  No needs identified.   PCP- Tarri FullerESCAJEDA, RICHARD MD    Expected Discharge Date:  09/09/16               Expected Discharge Plan:  Home w Home Health Services  In-House Referral:  NA  Discharge planning Services  CM Consult  Post Acute Care Choice:  NA Choice offered to:  NA  DME Arranged:  N/A DME Agency:  NA  HH Arranged:  NA HH Agency:     Status of Service:  Completed, signed off  If discussed at Long Length of Stay Meetings, dates discussed:    Additional Comments:  Elliot CousinShavis, Mellie Buccellato Ellen, RN 09/09/2016, 2:42 PM

## 2016-09-09 NOTE — CV Procedure (Signed)
Procedure: TEE  Indication: Atrial flutter  Sedation: Per anesthesiology  Findings: Please see echo section for full report.  Normal LV size and thickness.  EF 50-55%, low normal to mildly decreased systolic function.  The RV was poorly visualized.  Probably normal in size with mildly decreased systolic function.  Moderate left atrial enlargement.  No LA appendage thrombus.  No thrombus in the left atrium.  There was mild TR.  The mitral valve is status post repair.  The posterior leaflet is restricted.  There is mild eccentric mitral regurgitation.  There is moderate to severe mitral stenosis with valve area 1.4 cm^2 by PHT and mean gradient 13 mmHg.  The aortic valve is trileaflet without stenosis or regurgitation.  Trivial PI.  No evidence by color doppler for ASD or PFO.  The thoracic aorta is normal in caliber with mild plaque.    OK for DCCV.   Carlos Norton 09/09/2016 10:57 AM

## 2016-09-09 NOTE — Anesthesia Postprocedure Evaluation (Signed)
Anesthesia Post Note  Patient: Carlos Norton  Procedure(s) Performed: Procedure(s) (LRB): CARDIOVERSION (N/A) TRANSESOPHAGEAL ECHOCARDIOGRAM (TEE) (N/A)  Patient location during evaluation: PACU Anesthesia Type: MAC Level of consciousness: awake and alert Pain management: pain level controlled Vital Signs Assessment: post-procedure vital signs reviewed and stable Respiratory status: spontaneous breathing, nonlabored ventilation, respiratory function stable and patient connected to nasal cannula oxygen Cardiovascular status: stable and blood pressure returned to baseline Anesthetic complications: no    Last Vitals:  Vitals:   09/09/16 1130 09/09/16 1200  BP: (!) 77/60 94/67  Pulse: (!) 52 63  Resp: 20 15  Temp:      Last Pain:  Vitals:   09/09/16 1117  TempSrc: Oral  PainSc:                  Carlos Norton, Carlos Norton

## 2016-09-09 NOTE — Procedures (Addendum)
Electrical Cardioversion Procedure Note Carlos Norton [id number] 1941-03-07  Procedure: Electrical Cardioversion Indications:  Atrial Flutter  Procedure Details Consent: Risks of procedure as well as the alternatives and risks of each were explained to the (patient/caregiver).  Consent for procedure obtained. Time Out: Verified patient identification, verified procedure, site/side was marked, verified correct patient position, special equipment/implants available, medications/allergies/relevent history reviewed, required imaging and test results available.  Performed  Patient placed on cardiac monitor, pulse oximetry, supplemental oxygen as necessary.  Sedation given: Per anesthesiology Pacer pads placed anterior and posterior chest.  Cardioverted 1 time(s).  Cardioverted at 200J.  Evaluation Findings: Post procedure EKG shows: NSR Complications: None Patient did tolerate procedure well.   Carlos Norton 09/09/2016, 10:57 AM

## 2016-09-09 NOTE — Discharge Summary (Signed)
Advanced Heart Failure Discharge Note   Discharge Summary   Patient ID: Carlos Norton MRN: [id number], DOB/AGE: 12-22-1940 75 y.o. Admit date: 08/31/2016 D/C date:     09/09/2016   Primary Discharge Diagnoses:  1. Acute on chronic diastolic CHF 2. Mitral valve disease, severe by Echo 09/04/16 3. Atrial Flutter with RVR s/p DCCV/TEE 09/09/16 4. Profound anemia  5. UTI - + UA 09/07/16   Hospital Course:   Carlos Norton is a 75 y.o. male with history of rheumatic heart disease with Mitral Stenosis planned for repeat MVR, CAD s/p PCI LCx/RCA, paroxysmal atrial fibrillation, COPD, and prior GI bleeding was admitted with atrial flutter/RVR into150s and noted to be markedly anemic on labwork 09/04/16.  GI consulted, unable to get DCCV deferred with marked anemia.   Anticoag held for several days with bleeding. Received 2 UPRBCs 09/01/16. Hgb with appropriate rise and remained stable for the remainder of admission with a range of 7.9 - 9.0. 09/02/16 EGD/Entrescopy with gastric erosions as possible source of bleeding. Colonoscopy 09/03/16 with 5 colonic AVMs with oozing. Treated with APC + Clip and thought to be likely bleeding source.  Heparin resumed.   Afib treated with amio gtt with improvement in rate. Transitioned to po prior to d/c. Dr. Cornelius Moras followed in hospital and had planned for MVR with MAZE procedure 09/09/16.  Pt revealed on 09/07/16 his Daughter was being admitted to Hospice with Stage 4 Pancreatic CA and wife being treated for Breast CA. He wished to proceed with surgery to get it "out of the way".  Unfortunately, Urinalysis on evening of 09/07/16 positive for UTI.  Requiring treatment with ABX and deferment of surgery.  Discussed with patient would, when present options, opted to return in 2-3 weeks for surgery after spending time with family and allowing UTI treatment.  Pt will complete 10 day course of Cipro.    It was decided to cardiovert patient to attempt to improve functional status  while out of the hospital leading up to surgery. TEE required with anticoag gap 2/2 bleeding. Started back on Eliquis (at 5 mg BID dosing, previously under-dosed) on 09/08/16. Pt underwent TEE/DCCV 09/09/16 with successful conversion to NSR. Initially had bradycardia but stabilized into a rate of 60s prior to discharge.   Carlos Norton will be discharged to home in stable condition, and plan to return to hospital in next 2-3 weeks for surgery per Dr. Cornelius Moras.   He will have follow up in the HF clinic as below.     Discharge Weight Range: 158 lbs Discharge Vitals: Blood pressure 94/67, pulse 63, temperature 97.7 F (36.5 C), temperature source Oral, resp. rate 15, height 5\' 11"  (1.803 m), weight 158 lb 3.2 oz (71.8 kg), SpO2 100 %.  Labs: Lab Results  Component Value Date   WBC 7.6 09/09/2016   HGB 8.2 (L) 09/09/2016   HCT 28.9 (L) 09/09/2016   MCV 77.1 (L) 09/09/2016   PLT 145 (L) 09/09/2016     Recent Labs Lab 09/08/16 0458 09/09/16 0154  NA 135 136  K 4.0 4.1  CL 100* 100*  CO2 25 27  BUN 10 10  CREATININE 0.95 1.06  CALCIUM 9.0 9.0  PROT 5.9*  --   BILITOT 0.8  --   ALKPHOS 120  --   ALT 17  --   AST 23  --   GLUCOSE 99 89   No results found for: CHOL, HDL, LDLCALC, TRIG BNP (last 3 results)  Recent Labs  08/31/16 1934  08/31/16 2030  BNP 994.1* 877.0*    ProBNP (last 3 results) No results for input(s): PROBNP in the last 8760 hours.   Diagnostic Studies/Procedures   No results found.  Discharge Medications     Medication List    STOP taking these medications   aspirin EC 81 MG tablet   metoprolol succinate 25 MG 24 hr tablet Commonly known as:  TOPROL-XL     TAKE these medications   amiodarone 200 MG tablet Commonly known as:  PACERONE Take 1 tablet (200 mg) twice daily x one week, then on 09/16/16 begin taking 1 tablet (200 mg) once daily.   apixaban 5 MG Tabs tablet Commonly known as:  ELIQUIS Take 1 tablet (5 mg total) by mouth 2 (two) times  daily. What changed:  medication strength  how much to take   atorvastatin 20 MG tablet Commonly known as:  LIPITOR Take 20 mg by mouth daily at 6 PM.   budesonide-formoterol 80-4.5 MCG/ACT inhaler Commonly known as:  SYMBICORT Inhale 2 puffs into the lungs 2 (two) times daily.   CALCIUM 600/VITAMIN D 600-400 MG-UNIT Tabs Generic drug:  Calcium Carbonate-Vitamin D3 Take 1 tablet by mouth daily.   ciprofloxacin 500 MG tablet Commonly known as:  CIPRO Take 1 tablet (500 mg total) by mouth 2 (two) times daily.   furosemide 20 MG tablet Commonly known as:  LASIX Take 2 tablet (40 mg) every morning and 1 tablet (20 mg) every evening. What changed:  how much to take  how to take this  when to take this  additional instructions   pantoprazole 40 MG tablet Commonly known as:  PROTONIX Take 1 tablet (40 mg total) by mouth 2 (two) times daily. TAKE AT 0600 What changed:  how to take this  when to take this   potassium chloride 10 MEQ tablet Commonly known as:  KLOR-CON 10 Take 2 tablets (20 mEq total) by mouth daily. What changed:  how much to take  how to take this  when to take this   sildenafil 100 MG tablet Commonly known as:  VIAGRA Take 100 mg by mouth daily as needed for erectile dysfunction.   SPIRIVA HANDIHALER 18 MCG inhalation capsule Generic drug:  tiotropium Place 18 mcg into inhaler and inhale daily.       Disposition   The patient will be discharged in stable condition to home. Discharge Instructions    (HEART FAILURE PATIENTS) Call MD:  Anytime you have any of the following symptoms: 1) 3 pound weight gain in 24 hours or 5 pounds in 1 week 2) shortness of breath, with or without a dry hacking cough 3) swelling in the hands, feet or stomach 4) if you have to sleep on extra pillows at night in order to breathe.    Complete by:  As directed    Diet - low sodium heart healthy    Complete by:  As directed    Heart Failure patients record  your daily weight using the same scale at the same time of day    Complete by:  As directed    Increase activity slowly    Complete by:  As directed      Follow-up Information    Marca Ancona, MD Follow up on 09/22/2016.   Specialty:  Cardiology Why:  at 1020 for post hospital follow up. Please bring all of your medication to your visit.  Enter through the construction entrance off of [address].  Parking garage will be on  your right. The code for parking is 4000. Contact information: [address] [place] [address] [phone]        [facility] [place]. Call in 2 day(s).   Specialty:  Cardiothoracic Surgery Why:  Please call to confirm your upcoming appointment with Dr. Cornelius Moraswen.   Contact information: [address] [place] [id number] [phone]            Duration of Discharge Encounter: Greater than 35 minutes   Signed, Luane SchoolMichael Andrew Cylie Dor PA-C 09/09/2016, 3:34 PM

## 2016-09-09 NOTE — Progress Notes (Signed)
      [address]       [name] [id number]             [phone]     CARDIOTHORACIC SURGERY PROGRESS NOTE  Day of Surgery  S/P Procedure(s) (LRB): CARDIOVERSION (N/A) TRANSESOPHAGEAL ECHOCARDIOGRAM (TEE) (N/A)  Subjective: Still groggy from DCCV but reports feeling okay  Objective: Vital signs in last 24 hours: Temp:  [97.4 F (36.3 C)-98.1 F (36.7 C)] 97.7 F (36.5 C) (10/11 1117) Pulse Rate:  [51-122] 52 (10/11 1130) Cardiac Rhythm: Atrial flutter (10/11 0756) Resp:  [17-31] 20 (10/11 1130) BP: (77-105)/(53-83) 77/60 (10/11 1130) SpO2:  [92 %-100 %] 100 % (10/11 1130) Weight:  [158 lb 3.2 oz (71.8 kg)] 158 lb 3.2 oz (71.8 kg) (10/11 0401)  Physical Exam:  Rhythm:   sinus  Breath sounds: Diminished at bases  Heart sounds:  RRR  Incisions:  n/a  Abdomen:  soft  Extremities:  warm   Intake/Output from previous day: 10/10 0701 - 10/11 0700 In: 360 [P.O.:360] Out: 500 [Urine:500] Intake/Output this shift: Total I/O In: 250 [I.V.:250] Out: 100 [Urine:100]  Lab Results:  Recent Labs  09/08/16 0458 09/09/16 0154  WBC 9.5 7.6  HGB 9.0* 8.2*  HCT 31.5* 28.9*  PLT 131* 145*   BMET:  Recent Labs  09/08/16 0458 09/09/16 0154  NA 135 136  K 4.0 4.1  CL 100* 100*  CO2 25 27  GLUCOSE 99 89  BUN 10 10  CREATININE 0.95 1.06  CALCIUM 9.0 9.0    CBG (last 3)  No results for input(s): GLUCAP in the last 72 hours. PT/INR:   Recent Labs  09/08/16 0459  LABPROT 16.1*  INR 1.29    CXR:  N/A  Assessment/Plan: S/P Procedure(s) (LRB): CARDIOVERSION (N/A) TRANSESOPHAGEAL ECHOCARDIOGRAM (TEE) (N/A)  Will tentatively plan for redo MVR on November 2nd.  Patient will return to see me in the office 10 days in advance and we will recheck Hgb  Purcell Nailslarence H Ahnyla Mendel, MD 09/09/2016 11:36 AM

## 2016-09-09 NOTE — Progress Notes (Signed)
Patient ID: Carlos Norton, male   DOB: 12/04/1940, 75 y.o.   MRN: [id number]   ADVANCED HF ROUNDING NOTE  75 yo with history of rheumatic heart disease, CAD s/p PCI LCx/RCA, paroxysmal atrial fibrillation, COPD, and prior GI bleeding was admitted with atrial flutter/RVR and noted to be markedly anemic.   Patient had minimally invasive MV repair + Maze in 2011, this was complicated by GI bleeding (source uncertain, do not have records).  He was eventually taken off anticoagulation.  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  Since that time, he has been on Eliquis 2.5 bid + ASA 81 daily.  No melena or BRBPR noted.  He developed progressive exertional dyspnea over the last 2 months.  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  He was referred to Dr Cornelius Moras for mitral valve replacement.  Plan for MVR next week.  Seen at CVTS office 10/2, noted to be tachycardic around 150 and more short of breath, directly admitted.  At [facility], noted to be in atrial flutter with RVR.  CBC returned with hgb 6.2.  He was initially started on amiodarone gtt + heparin gtt, heparin stopped with low hemoglobin.   2 units PRBCs on 10/3.   Discussed 09/07/16. Daughter being admitted to Hospice with metastatic pancreatic CA. Wife being treated for breast CA. Pt wished to proceed with his surgery to "get it out of the way".  UA 09/07/16 significant for UTI.  Surgery postponed.   Creatinine and K stable. BPs remains soft in 80-90s. Feels OK. Frustrated surgery has to be postponed for UTI treatment, though anxious to get home for a time. Walking halls without difficulty. Appetite OK.   HR up 110-120s on amiodarone gtt. He is now back on apixaban.   Transitioned to po Lasix.  Hemoglobin 8.7 => 8.1 => 7.8 => 8.4 => 7.9 => 8.2 => 8.1 => 9.0 => 8.2.  No bleeding.    EGD/small bowel  enteroscopy 10/4: Erosion in stomach with stigmata of recent bleeding.   Colonoscopy 10/5: 5 colonic AVMs with oozing, APC + clip.  Likely this was bleeding source.    Scheduled Meds: . sodium chloride   Intravenous Once  . amiodarone  200 mg Oral BID  . apixaban  5 mg Oral Once  . apixaban  5 mg Oral BID  . atorvastatin  20 mg Oral q1800  . ciprofloxacin  500 mg Oral BID  . docusate sodium  100 mg Oral Daily  . furosemide  40 mg Oral Daily  . mometasone-formoterol  2 puff Inhalation BID  . pantoprazole  40 mg Oral BID  . sodium chloride flush  3 mL Intravenous Q12H  . tiotropium  18 mcg Inhalation Daily   Continuous Infusions: . sodium chloride     PRN Meds:.acetaminophen, ondansetron (ZOFRAN) IV, sodium chloride flush   Vitals:   09/08/16 2310 09/09/16 0000 09/09/16 0400 09/09/16 0401  BP: 96/83 94/66 94/76  94/76  Pulse: (!) 110 (!) 114 (!) 114 100  Resp: 17 (!) 24 (!) 23 (!) 26  Temp: 97.9 F (36.6 C)   97.4 F (36.3 C)  TempSrc: Oral   Oral  SpO2: 99% 98% 97% 97%  Weight:    158 lb 3.2 oz (71.8 kg)  Height:        Intake/Output Summary (Last 24 hours) at 09/09/16 0734 Last data  filed at 09/09/16 0500  Gross per 24 hour  Intake              360 ml  Output              500 ml  Net             -140 ml    LABS: Basic Metabolic Panel:  Recent Labs  40/98/11 0458 09/09/16 0154  NA 135 136  K 4.0 4.1  CL 100* 100*  CO2 25 27  GLUCOSE 99 89  BUN 10 10  CREATININE 0.95 1.06  CALCIUM 9.0 9.0   Liver Function Tests:  Recent Labs  09/08/16 0458  AST 23  ALT 17  ALKPHOS 120  BILITOT 0.8  PROT 5.9*  ALBUMIN 2.8*   No results for input(s): LIPASE, AMYLASE in the last 72 hours. CBC:  Recent Labs  09/08/16 0458 09/09/16 0154  WBC 9.5 7.6  HGB 9.0* 8.2*  HCT 31.5* 28.9*  MCV 76.6* 77.1*  PLT 131* 145*   Cardiac Enzymes: No results for input(s): CKTOTAL, CKMB, CKMBINDEX, TROPONINI in the last 72 hours. BNP: Invalid input(s):  POCBNP D-Dimer: No results for input(s): DDIMER in the last 72 hours. Hemoglobin A1C:  Recent Labs  09/08/16 0458  HGBA1C 5.1   Fasting Lipid Panel: No results for input(s): CHOL, HDL, LDLCALC, TRIG, CHOLHDL, LDLDIRECT in the last 72 hours. Thyroid Function Tests: No results for input(s): TSH, T4TOTAL, T3FREE, THYROIDAB in the last 72 hours.  Invalid input(s): FREET3 Anemia Panel: No results for input(s): VITAMINB12, FOLATE, FERRITIN, TIBC, IRON, RETICCTPCT in the last 72 hours.  RADIOLOGY: Dg Chest 2 View  Result Date: 09/07/2016 CLINICAL DATA:  Cough and shortness of Breath EXAM: CHEST  2 VIEW COMPARISON:  09/01/2016 FINDINGS: Cardiac shadow is enlarged. Left-sided pleural effusion is again noted and stable. A smaller right-sided effusion is noted as well. There also appears to be some fluid within the major fissure on the right stable from the prior exam degree of vascular congestion has improved. Persistent bibasilar atelectasis/infiltrate is noted. IMPRESSION: Stable bilateral pleural effusions. Bibasilar changes worse on the left than the right Electronically Signed   By: Alcide Clever M.D.   On: 09/07/2016 10:59   Dg Chest Port 1 View  Result Date: 09/01/2016 CLINICAL DATA:  Congestive heart failure. Shortness of breath . Congestive heart failure. Shortness breath. EXAM: PORTABLE CHEST 1 VIEW COMPARISON:  08/31/2016. FINDINGS: Cardiomegaly with diffuse bilateral pulmonary infiltrates and bilateral pleural effusions. 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 Exam stable prior exam. No pneumothorax. IMPRESSION: Persistent congestive heart failure with pulmonary edema bilateral pleural effusions again noted. No significant interim change. Electronically Signed   By: Maisie Fus  Register   On: 09/01/2016 07:02   Dg Chest Port 1 View  Result Date: 08/31/2016 CLINICAL DATA:  Atrial fibrillation, dyspnea EXAM: PORTABLE CHEST 1 VIEW  COMPARISON:  04/09/2016 FINDINGS: There is cardiomegaly with pulmonary vascular redistribution consistent with CHF. Opacities are noted at the left lung base and periphery of the left lower thorax consistent with moderate layering effusion. Ovoid density in in the right mid thorax may represent fluid in the major fissure. Aortic atherosclerosis without aneurysm. No acute osseous abnormality. IMPRESSION: Cardiomegaly with CHF and bilateral pleural effusions left greater than right. Electronically Signed   By: Tollie Eth M.D.   On: 08/31/2016 23:42    PHYSICAL EXAM General: NAD, sitting up in chair.  Neck: JVP 7-8  cm, no thyromegaly or thyroid nodule.  Lungs: Clear normal effort CV: Nondisplaced PMI.  Heart tachy, regular S1/S2, no S3/S4, 1/6 SEM RUSB.  No edema. Ted hose present Abdomen: Soft, NT, ND, no HSM. No bruits or masses. +BS  Neurologic: Alert and oriented x 3.  Psych: Normal affect. Extremities: No clubbing or cyanosis.   TELEMETRY: Reviewed, Aflutter 110s this am.  ASSESSMENT AND PLAN: 75 yo with history of rheumatic heart disease, CAD s/p PCI LCx/RCA, paroxysmal atrial fibrillation, COPD, and prior GI bleeding was admitted with atrial flutter/RVR and noted to be markedly anemic.  1. Acute on chronic diastolic CHF: Preserved EF on echo with severe mitral stenosis.  RHC [place] with elevated PCWP and PA pressure.  Patient was volume overloaded on exam in setting of severe mitral stenosis and atrial flutter with RVR.  The rapid rate impairs the already difficult LV filling.  Initially not cardioverted with severe anemia and GI bleeding.  - Continue Lasix 40 mg daily.  - Plan DCCV today.   2. Mitral valve disease: Rheumatic MV, s/p MV repair in 2011.  [facility] showed mild to moderate MR with severe mitral stenosis, mean gradient 14. Severe MS confirmed by echo 10/6 - Dr Cornelius Moraswen to perform MVR. Now has to be deferred for a couple of weeks with UTI.  4. Atrial  flutter: With RVR.  Tolerates poorly with severe mitral stenosis.  He had a recent cardioembolic event (5/17 embolus to subclavian).  He had APC of colonic AVMs on 10/5.  Per GI, these were likely the source of bleeding and we should be able to resume anticoagulation. Now he is getting apixaban.  No overt bleeding. - As MVR/Maze will be delayed, will plan TEE-guided DCCV today (needs TEE as anticoagulation was interrupted with GI bleed, and he has recent history of cardioembolism). 5. Anemia: Severe, hgb 6.1 at admission. Last hgb back in 7/17 was in the 9 range.  Has hx of GI bleeding 2000 peri previous cardiac surgery.  2 units PRBCs 10/3 with hgb 8.7 => 8.1 => 7.8 => 8.4 => 7.9 => 8.2 -> 8.1 => 9.0 => 8.2.  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   - Now on Eliquis, continue.   6. UTI:  On cipro. Surgery postponed.  7.  Surgery postponed. Likely home in next day or two and to return in a few weeks for MVR + Maze, hopefully after TEE-DCCV today.  We will follow up closely in HF clinic.    Marca AnconaDalton Korbin Notaro,  09/09/2016 7:34 AM  Advanced Heart Failure Team Pager [phone] (M-F; 7a - 4p)  Please contact [facility] for night-coverage after hours (4p -7a ) and weekends on [email or web address]

## 2016-09-09 NOTE — Anesthesia Procedure Notes (Signed)
Procedure Name: MAC Date/Time: 09/09/2016 10:25 AM Performed by: Marena ChancyBECKNER, Jessaca Philippi S Pre-anesthesia Checklist: Patient identified, Suction available, Emergency Drugs available, Patient being monitored and Timeout performed Patient Re-evaluated:Patient Re-evaluated prior to inductionOxygen Delivery Method: Simple face mask

## 2016-09-09 NOTE — Progress Notes (Signed)
Echocardiogram Echocardiogram Transesophageal has been performed.  Marisue Humblelexis N Demetrion Wesby 09/09/2016, 11:12 AM

## 2016-09-09 NOTE — Anesthesia Preprocedure Evaluation (Signed)
Anesthesia Evaluation  Patient identified by MRN, date of birth, ID band Patient awake    Reviewed: Allergy & Precautions, H&P , NPO status , Patient's Chart, lab work & pertinent test results  Airway Mallampati: II  TM Distance: >3 FB Neck ROM: Full    Dental no notable dental hx. (+) Edentulous Upper, Edentulous Lower, Dental Advisory Given   Pulmonary shortness of breath and with exertion, COPD, former smoker,    Pulmonary exam normal breath sounds clear to auscultation       Cardiovascular + CAD, + Cardiac Stents and +CHF  + dysrhythmias Atrial Fibrillation + Valvular Problems/Murmurs  Rhythm:Irregular Rate:Tachycardia     Neuro/Psych negative neurological ROS  negative psych ROS   GI/Hepatic negative GI ROS, Neg liver ROS,   Endo/Other  negative endocrine ROS  Renal/GU negative Renal ROS  negative genitourinary   Musculoskeletal   Abdominal   Peds  Hematology negative hematology ROS (+) anemia ,   Anesthesia Other Findings   Reproductive/Obstetrics negative OB ROS                             Anesthesia Physical  Anesthesia Plan  ASA: IV  Anesthesia Plan: MAC   Post-op Pain Management:    Induction: Intravenous  Airway Management Planned: Simple Face Mask  Additional Equipment:   Intra-op Plan:   Post-operative Plan:   Informed Consent: I have reviewed the patients History and Physical, chart, labs and discussed the procedure including the risks, benefits and alternatives for the proposed anesthesia with the patient or authorized representative who has indicated his/her understanding and acceptance.   Dental advisory given  Plan Discussed with: CRNA  Anesthesia Plan Comments:         Anesthesia Quick Evaluation

## 2016-09-09 NOTE — Care Management Important Message (Signed)
Important Message  Patient Details  Name: Carlos Norton MRN: [id number] Date of Birth: [birth year]   Medicare Important Message Given:  Yes    Elliot CousinShavis, Niccolo Burggraf Ellen, RN 09/09/2016, 2:41 PM

## 2016-09-10 ENCOUNTER — Encounter: Payer: Self-pay | Admitting: Thoracic Surgery (Cardiothoracic Vascular Surgery)

## 2016-09-10 LAB — URINE CULTURE

## 2016-09-14 ENCOUNTER — Other Ambulatory Visit (HOSPITAL_COMMUNITY): Payer: Medicare Other

## 2016-09-14 ENCOUNTER — Encounter (HOSPITAL_COMMUNITY): Payer: Medicare Other

## 2016-09-22 ENCOUNTER — Encounter (HOSPITAL_COMMUNITY): Payer: Self-pay

## 2016-09-22 ENCOUNTER — Ambulatory Visit: Payer: Medicare Other | Admitting: Thoracic Surgery (Cardiothoracic Vascular Surgery)

## 2016-09-22 ENCOUNTER — Ambulatory Visit (HOSPITAL_COMMUNITY)
Admit: 2016-09-22 | Discharge: 2016-09-22 | Disposition: A | Discharge status: Home / Self Care | Payer: Medicare Other | Attending: Cardiology | Admitting: Cardiology

## 2016-09-22 VITALS — BP 110/62 | HR 67 | Wt 149.5 lb

## 2016-09-22 DIAGNOSIS — I340 Nonrheumatic mitral (valve) insufficiency: Secondary | ICD-10-CM | POA: Insufficient documentation

## 2016-09-22 DIAGNOSIS — J449 Chronic obstructive pulmonary disease, unspecified: Secondary | ICD-10-CM | POA: Insufficient documentation

## 2016-09-22 DIAGNOSIS — Z87891 Personal history of nicotine dependence: Secondary | ICD-10-CM | POA: Insufficient documentation

## 2016-09-22 DIAGNOSIS — I480 Paroxysmal atrial fibrillation: Secondary | ICD-10-CM | POA: Insufficient documentation

## 2016-09-22 DIAGNOSIS — I2510 Atherosclerotic heart disease of native coronary artery without angina pectoris: Secondary | ICD-10-CM | POA: Insufficient documentation

## 2016-09-22 DIAGNOSIS — Z7901 Long term (current) use of anticoagulants: Secondary | ICD-10-CM | POA: Diagnosis not present

## 2016-09-22 DIAGNOSIS — Z952 Presence of prosthetic heart valve: Secondary | ICD-10-CM | POA: Insufficient documentation

## 2016-09-22 DIAGNOSIS — I4892 Unspecified atrial flutter: Secondary | ICD-10-CM | POA: Diagnosis not present

## 2016-09-22 DIAGNOSIS — I5032 Chronic diastolic (congestive) heart failure: Secondary | ICD-10-CM | POA: Diagnosis not present

## 2016-09-22 DIAGNOSIS — I099 Rheumatic heart disease, unspecified: Secondary | ICD-10-CM | POA: Diagnosis present

## 2016-09-22 DIAGNOSIS — I050 Rheumatic mitral stenosis: Secondary | ICD-10-CM | POA: Diagnosis not present

## 2016-09-22 DIAGNOSIS — Z9889 Other specified postprocedural states: Secondary | ICD-10-CM | POA: Insufficient documentation

## 2016-09-22 LAB — CBC
HCT: 33.9 % — ABNORMAL LOW (ref 39.0–52.0)
HEMOGLOBIN: 10 g/dL — AB (ref 13.0–17.0)
MCH: 23.1 pg — ABNORMAL LOW (ref 26.0–34.0)
MCHC: 29.5 g/dL — ABNORMAL LOW (ref 30.0–36.0)
MCV: 78.3 fL (ref 78.0–100.0)
PLATELETS: 215 10*3/uL (ref 150–400)
RBC: 4.33 MIL/uL (ref 4.22–5.81)
RDW: 25.3 % — ABNORMAL HIGH (ref 11.5–15.5)
WBC: 7.9 10*3/uL (ref 4.0–10.5)

## 2016-09-22 LAB — COMPREHENSIVE METABOLIC PANEL
ALT: 15 U/L — AB (ref 17–63)
ANION GAP: 8 (ref 5–15)
AST: 31 U/L (ref 15–41)
Albumin: 3.4 g/dL — ABNORMAL LOW (ref 3.5–5.0)
Alkaline Phosphatase: 115 U/L (ref 38–126)
BUN: 14 mg/dL (ref 6–20)
CALCIUM: 9.2 mg/dL (ref 8.9–10.3)
CHLORIDE: 98 mmol/L — AB (ref 101–111)
CO2: 30 mmol/L (ref 22–32)
CREATININE: 1.38 mg/dL — AB (ref 0.61–1.24)
GFR, EST AFRICAN AMERICAN: 57 mL/min — AB (ref >60)
GFR, EST NON AFRICAN AMERICAN: 49 mL/min — AB (ref >60)
Glucose, Bld: 95 mg/dL (ref 65–99)
Potassium: 3.8 mmol/L (ref 3.5–5.1)
Sodium: 136 mmol/L (ref 135–145)
Total Bilirubin: 1.2 mg/dL (ref 0.3–1.2)
Total Protein: 6.9 g/dL (ref 6.5–8.1)

## 2016-09-22 LAB — TSH: TSH: 3.01 u[IU]/mL (ref 0.350–4.500)

## 2016-09-22 NOTE — Progress Notes (Signed)
PCP: Dr Watt ClimesEscajeda Cardiology: Dr. Rhona Leavenshiu [facility]([place])  75 yo with history of rheumatic heart disease s/p mitral valve repair and paroxysmal atrial fibrillation/flutter presents for followup post-hospitalization.  Patient had initial MV repair and Maze procedure for mitral regurgitation/atrial fibrillation in 2011.  He stopped anticoagulation due to GI bleeding.  He did reasonably well until 5/17 when he had an embolic event to the left subclavian and ended up having an embolectomy.  He was started on apixaban at that time.  Also around that time, he noted increased exertional dyspnea.  TEE was suggestive of moderate to severe stenosis of the repaired mitral valve.  Nonobstructive CAD on cardiac cath.  He was referred back to Dr Cornelius Moraswen for possible mitral valve replacement.    In 10/17, he was seeing Dr. Cornelius Moraswen in the office prior to his scheduled mitral valve replacement.  He was noted to be in atrial flutter with HR around 150.  He was also in pulmonary edema.  He was admitted and diuresed, amiodarone was begun.  We planned to cardiovert him, but CBC showed hemoglobin < 7.  Anticoagulation was held and GI workup was done.  He had erosive gastritis on EGD and 5 bleeding colonic AVMs.  The AVMs were treated with APC and clipping.  Anticoagulation was restarted after hemoglobin stabilized.  He had TEE-guided cardioversion back to NSR.   TEE confirmed severe stenosis of the repaired mitral valve with mean gradient 14 mmHg.  Patient was also noted to have a UTI.  Surgery was rescheduled for 10/01/16.   Today, Mr Earlene PlaterDavis is seen in hospital followup.  He is doing well.  He gets short of breath walking fast or climbing up stairs.  However, he feels considerably better than prior to last admission.  He remains in NSR today.  No palpitations. No lightheadedness.  No chest pain.  No BRBPR or melena.   ECG; NSR, anteroseptal T wave inversions  Labs (10/17): K 4.1, creatinine 1.06, hgb 8.2  PMH: 1. Rheumatic heart disease:  s/p MV repair and Maze in 2011.  - TEE (10/17) with EF 50-55%, moderate LAE, s/p MV repair with mild MR, severe mitral stenosis with mean gradient 13 mmHg/MVA 1.4 cm^2 by PHT.   2. CAD: s/p PCI to LCx and RCA.  LHC (9/17) with nonobstructive coronary disease.  3. Atrial fibrillation: Paroxysmal.  Maze in 2011 with MV repair.  Was off anticoagulation, then had cardioembolic event to left subclavian requiring embolectomy in 5/17.  Started on apixaban.  4. Atrial flutter: Noted in 10/17.  Had TEE-guided DCCV in 10/17.  5. Chronic diastolic CHF: Likely related to MV disease primarily.  6. H/o GI bleeding:  - 2011 GI bleeding of uncertain etiology.  - GI bleeding 10/17: colonoscopy with 5 colonic AVMs noted, treated with APC and clipping.  EGD showed erosive gastritis.  7. COPD  SH: Married, [place], prior smoker.  Daughter recently passed away from pancreatic cancer.   FH: No premature CAD  ROS: All systems reviewed and negative except as per HPI.   Current Outpatient Prescriptions  Medication Sig Dispense Refill  . amiodarone (PACERONE) 200 MG tablet Take 1 tablet (200 mg) twice daily x one week, then on 09/16/16 begin taking 1 tablet (200 mg) once daily. 40 tablet 6  . apixaban (ELIQUIS) 5 MG TABS tablet Take 1 tablet (5 mg total) by mouth 2 (two) times daily. 60 tablet 6  . atorvastatin (LIPITOR) 20 MG tablet Take 20 mg by mouth daily at  6 PM.     . budesonide-formoterol (SYMBICORT) 80-4.5 MCG/ACT inhaler Inhale 2 puffs into the lungs 2 (two) times daily.    . Calcium Carbonate-Vitamin D3 (CALCIUM 600/VITAMIN D) 600-400 MG-UNIT TABS Take 1 tablet by mouth daily.    . furosemide (LASIX) 20 MG tablet Take 2 tablet (40 mg) every morning and 1 tablet (20 mg) every evening. 90 tablet 6  . pantoprazole (PROTONIX) 40 MG tablet Take 1 tablet (40 mg total) by mouth 2 (two) times daily. TAKE AT 0600 60 tablet 6  . potassium chloride (KLOR-CON 10) 10 MEQ tablet Take 2 tablets (20 mEq total) by  mouth daily. 60 tablet 6  . sildenafil (VIAGRA) 100 MG tablet Take 100 mg by mouth daily as needed for erectile dysfunction.    Marland Kitchen SPIRIVA HANDIHALER 18 MCG inhalation capsule Place 18 mcg into inhaler and inhale daily.      No current facility-administered medications for this encounter.    BP 110/62   Pulse 67   Wt 149 lb 8 oz (67.8 kg)   SpO2 98%   BMI 20.85 kg/m  General: NAD Neck: No JVD, no thyromegaly or thyroid nodule.  Lungs: Clear to auscultation bilaterally with normal respiratory effort. CV: Nondisplaced PMI.  Heart regular S1/S2, no S3/S4, 1/6 early SEM RUSB.  No peripheral edema.  No carotid bruit.  Normal pedal pulses.  Abdomen: Soft, nontender, no hepatosplenomegaly, no distention.  Skin: Intact without lesions or rashes.  Neurologic: Alert and oriented x 3.  Psych: Normal affect. Extremities: No clubbing or cyanosis.  HEENT: Normal.   Assessment/Plan: 1. Rheumatic heart disease: Moderate to severe mitral stenosis, s/p mitral valve repair for mitral regurgitation.  He is symptomatic, needs mitral valve replacement.  Planned for 10/01/16.  2. Paroxysmal atrial fibrillation and atrial flutter: Atrial flutter during most recent admission.  Now s/p DCCV and remains in NSR.  He had Maze with his 2011 MV repair.  - Continue amiodarone, now on 200 mg daily.  Will check LFTs/TSH today.  Will need regular eye exams.  - He is on apixaban currently, but given valvular atrial fibrillation, would transition him to coumadin post-operatively for long-term.  - He has a history of an embolic event (subclavian embolism), so if he needs to come off coumadin in the future should have Lovenox bridge.  3. Chronic diastolic CHF: Due to valvular heart disease primarily.  Still NYHA class III symptoms, but volume status now looks ok on current Lasix.  - Continue Lasix 40 qam/20 qpm.  BMET today.  4. GI bleeding: Recent episode of GI bleeding likely from colonic AVMs, treated.   No BRBPR or  melena.  Check CBC.  5. CAD: History of PCI.  LHC this year showed nonobstructive disease.  No ASA given anticoagulation.  Continue statin.   Marca Ancona 09/22/2016 2:26 PM

## 2016-09-22 NOTE — Patient Instructions (Signed)
Routine lab work today. Will notify you of abnormal results, otherwise no news is good news!  Follow up with Dr. Shirlee LatchMcLean as needed.  Do the following things EVERYDAY: 1) Weigh yourself in the morning before breakfast. Write it down and keep it in a log. 2) Take your medicines as prescribed 3) Eat low salt foods-Limit salt (sodium) to 2000 mg per day.  4) Stay as active as you can everyday 5) Limit all fluids for the day to less than 2 liters

## 2016-09-23 ENCOUNTER — Encounter: Payer: Self-pay | Admitting: Thoracic Surgery (Cardiothoracic Vascular Surgery)

## 2016-09-23 ENCOUNTER — Ambulatory Visit (INDEPENDENT_AMBULATORY_CARE_PROVIDER_SITE_OTHER): Payer: Medicare Other | Admitting: Thoracic Surgery (Cardiothoracic Vascular Surgery)

## 2016-09-23 VITALS — BP 98/61 | HR 66 | Resp 20 | Ht 71.0 in | Wt 148.1 lb

## 2016-09-23 DIAGNOSIS — I742 Embolism and thrombosis of arteries of the upper extremities: Secondary | ICD-10-CM | POA: Diagnosis not present

## 2016-09-23 DIAGNOSIS — I051 Rheumatic mitral insufficiency: Secondary | ICD-10-CM | POA: Diagnosis not present

## 2016-09-23 DIAGNOSIS — I5032 Chronic diastolic (congestive) heart failure: Secondary | ICD-10-CM

## 2016-09-23 DIAGNOSIS — I272 Pulmonary hypertension, unspecified: Secondary | ICD-10-CM

## 2016-09-23 DIAGNOSIS — I050 Rheumatic mitral stenosis: Secondary | ICD-10-CM

## 2016-09-23 DIAGNOSIS — I099 Rheumatic heart disease, unspecified: Secondary | ICD-10-CM | POA: Diagnosis not present

## 2016-09-23 DIAGNOSIS — Z9889 Other specified postprocedural states: Secondary | ICD-10-CM

## 2016-09-23 MED ORDER — ENOXAPARIN SODIUM 80 MG/0.8ML ~~LOC~~ SOLN: 80.0000 mg | SUBCUTANEOUS | 0 refills | Status: DC

## 2016-09-23 NOTE — Progress Notes (Signed)
[address]       [place], [address]             [phone]     CARDIOTHORACIC SURGERY OFFICE NOTE  Referring Provider is Sherryl Barters, MD PCP is Rubie Maid, MD   HPI:  Patient is a 75 year old male with long-standing history of rheumatic heart disease status post mitral valve repair and Maze procedure in 2011 who returns to the office today for follow-up of severe mitral stenosis with mild to moderate mitral regurgitation and chronic diastolic congestive heart failure. He was originally seen in consultation on 07/16/2016. We had previously made plans for redo mitral valve replacement last month but when the patient presented for follow-up on 08/31/2016 he was found to be in acute decompensated congestive heart failure with rapid atrial flutter and severe anemia. He was hospitalized and started on amiodarone. Anticoagulation was held and he underwent EGD revealing erosive gastritis and colonoscopy revealing 5 bleeding colonic AVMs. The AVMs were treated with APC and clipping.  Anticoagulation was restarted after hemoglobin stabilized and he ultimately underwent TEE E guided cardioversion back into sinus rhythm. TEE confirmed the presence of severe mitral stenosis with mean transvalvular gradient measured 14 mmHg. He was also treated with ciprofloxacin for urinary tract infection. He was discharged home and seen in follow-up yesterday by Dr. Aundra Dubin. The patient remained in sinus rhythm and hemoglobin had gone up to 10.0. He returns to our office today with hopes to proceed with redo mitral valve replacement in the near future. The patient reports that he has been doing well since hospital discharge. He has gradually improved in strength. He still gets short of breath with activity, but he is doing much better than he was at the time of his last presentation in early October.  He denies any fevers, chills, or productive cough. He has not seen any blood in his  stool. He has no symptoms of dysuria or hematuria.   Current Outpatient Prescriptions  Medication Sig Dispense Refill  . amiodarone (PACERONE) 200 MG tablet Take 1 tablet (200 mg) twice daily x one week, then on 09/16/16 begin taking 1 tablet (200 mg) once daily. 40 tablet 6  . apixaban (ELIQUIS) 5 MG TABS tablet Take 1 tablet (5 mg total) by mouth 2 (two) times daily. 60 tablet 6  . atorvastatin (LIPITOR) 20 MG tablet Take 20 mg by mouth daily at 6 PM.     . budesonide-formoterol (SYMBICORT) 80-4.5 MCG/ACT inhaler Inhale 2 puffs into the lungs 2 (two) times daily.    . Calcium Carbonate-Vitamin D3 (CALCIUM 600/VITAMIN D) 600-400 MG-UNIT TABS Take 1 tablet by mouth daily.    . furosemide (LASIX) 20 MG tablet Take 2 tablet (40 mg) every morning and 1 tablet (20 mg) every evening. 90 tablet 6  . pantoprazole (PROTONIX) 40 MG tablet Take 1 tablet (40 mg total) by mouth 2 (two) times daily. TAKE AT 0600 60 tablet 6  . potassium chloride (KLOR-CON 10) 10 MEQ tablet Take 2 tablets (20 mEq total) by mouth daily. 60 tablet 6  . sildenafil (VIAGRA) 100 MG tablet Take 100 mg by mouth daily as needed for erectile dysfunction.    Marland Kitchen SPIRIVA HANDIHALER 18 MCG inhalation capsule Place 18 mcg into inhaler and inhale daily.      No current facility-administered medications for this visit.       Physical Exam:   BP 98/61 (BP Location: Left Arm, Patient Position: Sitting, Cuff Size:  Small)   Pulse 66   Resp 20   Ht 5' 11"  (1.803 m)   Wt 148 lb 1.6 oz (67.2 kg)   SpO2 99% Comment: RA  BMI 20.66 kg/m   General:  Thin and somewhat frail but well-appearing  Chest:   Clear to auscultation  CV:   Regular rate and rhythm  Incisions:  n/a  Abdomen:  Soft nontender  Extremities:  Warm and well-perfused  Diagnostic Tests:  Results for Carlos Norton, Carlos Norton (MRN [id number]) as of 09/23/2016 18:18  Ref. Range 09/22/2016 10:00  Sodium Latest Ref Range: 135 - 145 mmol/L 136  Potassium Latest Ref Range: 3.5 - 5.1  mmol/L 3.8  Chloride Latest Ref Range: 101 - 111 mmol/L 98 (L)  CO2 Latest Ref Range: 22 - 32 mmol/L 30  BUN Latest Ref Range: 6 - 20 mg/dL 14  Creatinine Latest Ref Range: 0.61 - 1.24 mg/dL 1.38 (H)  Calcium Latest Ref Range: 8.9 - 10.3 mg/dL 9.2  EGFR (Non-African Amer.) Latest Ref Range: >60 mL/min 49 (L)  EGFR (African American) Latest Ref Range: >60 mL/min 57 (L)  Glucose Latest Ref Range: 65 - 99 mg/dL 95  Anion gap Latest Ref Range: 5 - 15  8  Alkaline Phosphatase Latest Ref Range: 38 - 126 U/L 115  Albumin Latest Ref Range: 3.5 - 5.0 g/dL 3.4 (L)  AST Latest Ref Range: 15 - 41 U/L 31  ALT Latest Ref Range: 17 - 63 U/L 15 (L)  Total Protein Latest Ref Range: 6.5 - 8.1 g/dL 6.9  Total Bilirubin Latest Ref Range: 0.3 - 1.2 mg/dL 1.2  WBC Latest Ref Range: 4.0 - 10.5 K/uL 7.9  RBC Latest Ref Range: 4.22 - 5.81 MIL/uL 4.33  Hemoglobin Latest Ref Range: 13.0 - 17.0 g/dL 10.0 (L)  HCT Latest Ref Range: 39.0 - 52.0 % 33.9 (L)  MCV Latest Ref Range: 78.0 - 100.0 fL 78.3  MCH Latest Ref Range: 26.0 - 34.0 pg 23.1 (L)  MCHC Latest Ref Range: 30.0 - 36.0 g/dL 29.5 (L)  RDW Latest Ref Range: 11.5 - 15.5 % 25.3 (H)  Platelets Latest Ref Range: 150 - 400 K/uL 215     Impression:  Patient has long-standing rheumatic heart disease with stage D severe symptomatic mitral stenosis and moderate mitral regurgitation, status post mitral valve repair in 2011. Left ventricular systolic function remains preserved.  There is no sign of left atrial thrombus. Options include continued medical therapy versus redo mitral valve replacement.   Plan:  The patient and his wife wereagain counseled at length regarding the indications, risks and potential benefits of redo mitral valve replacement. The rationale for elective surgery has been explained, including a comparison between surgery, balloon mitral valvuloplasty,and continued medical therapy with close follow-up. We discussed the possibility of  replacing the mitral valve using a mechanical prosthesis with the attendant need for long-term anticoagulation versus the alternative of replacing it using a bioprosthetic tissue valve with its potential for late structural valve deterioration and failure, depending upon the patient's longevity. The patient specifically requests that his mitral valve be replaced using a bioprosthetic tissue valve.   The patient understands and accepts all potential risks of surgery including but not limited to risk of death, stroke or other neurologic complication, myocardial infarction, congestive heart failure, respiratory failure, renal failure, bleeding requiring transfusion and/or reexploration, arrhythmia, infection or other wound complications, pneumonia, pleural and/or pericardial effusion, pulmonary embolus, aortic dissection or other major vascular complication, or delayed complications related to valve  repair or replacement including but not limited to structural valve deterioration and failure, thrombosis, embolization, endocarditis, or paravalvular leak.  Expectations for the patient's postoperative convalescence a been discussed. All of their questions have been answered.  We plan to proceed with redo mitral valve replacement using a bioprosthetic tissue valve on 10/01/2016. The patient has been instructed to stop taking Eliquis tomorrow. He will begin Lovenox injections the following day. He will otherwise remain on all current medications without change until the morning of surgery. All of his questions have been addressed.  I spent in excess of 15 minutes during the conduct of this office consultation and >50% of this time involved direct face-to-face encounter with the patient for counseling and/or coordination of their care.   Valentina Gu. Roxy Manns, MD 09/23/2016 3:31 PM

## 2016-09-23 NOTE — Patient Instructions (Signed)
Patient has been instructed to stop taking Eliquis after you take it tomorrow.  Begin Lovenox injections on Friday 10/27.  Your last lovenox injection should be in the morning on Wednesday 11/1.   Patient should continue taking all other medications without change through the day before surgery.  Patient should have nothing to eat or drink after midnight the night before surgery.  On the morning of surgery patient should take only Protonix and Spiriva with a sip of water.  Do NOT take a lovenox injection on the morning of surgery.

## 2016-09-25 ENCOUNTER — Ambulatory Visit: Payer: Medicare Other | Admitting: Thoracic Surgery (Cardiothoracic Vascular Surgery)

## 2016-09-27 NOTE — H&P (Signed)
[address]       [name] [id number]             [phone]          CARDIOTHORACIC SURGERY HISTORY AND PHYSICAL EXAM  Referring Provider is Caryl Adahiu, Jenyung Andy, MD PCP is Tarri FullerESCAJEDA, RICHARD, MD      Chief Complaint  Patient presents with  . Mitral Stenosis    s/p MINI MVREPAIR/MAZE 2011...ECHO 04/10/16, CATH 07/10/16    HPI:  Patient is a 75 year old male with long-standing history of rheumatic heart disease, coronary artery disease status post PCI and stenting in the past, atrial fibrillation, and COPD who underwent minimally invasive mitral valve repair and Maze procedure on 06/18/2010 for severe mitral regurgitation and recurrent persistent atrial fibrillation.  The patient did well postoperatively and was last seen here in our office on 08/15/2012. He has never had any documented recurrence of atrial fibrillation and he was taken off of Coumadin anticoagulation because of history of intolerance due to GI bleeding while he was taking Coumadin and Plavix prior to his surgery. 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 He was hospitalized at [facility] where he underwent surgical embolectomy on 04/09/2016. He states that initially he was thought to be having a stroke but this was ruled out and ultimately the thrombus in the left brachial artery was identified. Transthoracic echocardiogram performed at that time revealed left ventricular hypertrophy with normal left ventricular systolic function, ejection fraction estimated 55-60%. There was mitral annular calcification with calcification of the mitral valve leaflets and "moderate to severe functional stenosis". The mean transvalvular gradient across the mitral valve was estimated 14 mmHg with mitral valve area calculated 0.85 cm. A definite source of the embolus was not identified and to the  patient's recollection he did not undergo TEE.  He was started on Eliquis at that time. After his surgical procedure he developed severe problems with bladder outlet obstruction for which he ultimately underwent transurethral laser prostatectomy in early June.  Ever since the patient has had problems with progressive symptoms of exertional shortness of breath.  He was seen in follow-up by Dr. Rhona Leavenshiu and underwent left and right heart catheterization on 07/10/2016. He was found to have mild nonobstructive coronary artery disease. Previously placed stents in the right coronary artery and the left circumflex coronary artery were widely patent. There was severe pulmonary hypertension with PA pressures measured 72/26 and mean pulmonary capillary wedge pressure 26 mmHg. Mean transvalvular gradient across the mitral valve was estimated 20 mmHg. The patient was referred for surgical consultation.  The patient is married and [place] locally in [place] with his [place]. He retired from [employer] business approximately one year ago. [place] amount of time there. The patient has remained physically active during his retirement until recently when he has been limited by exertional shortness of breath.  He states that he now gets short of breath with mild to moderate activity in this limits his activity to a significant degree. He denies any history of resting shortness of breath, PND, orthopnea, or lower extremity edema. He has not had any chest pain or chest tightness either with activity or at rest. He has not had any palpitations nor other symptoms to suggest a recurrence of atrial fibrillation. He states that  while he was in the hospital recently he has always been in sinus rhythm. He has not had any bleeding problems since he started taking Eliquis.  Patient was originally seen in consultation on 07/16/2016. We had previously made plans for redo mitral valve replacement last  month but when the patient presented for follow-up on 08/31/2016 he was found to be in acute decompensated congestive heart failure with rapid atrial flutter and severe anemia. He was hospitalized and started on amiodarone. Anticoagulation was held and he underwent EGD revealing erosive gastritis and colonoscopy revealing 5 bleeding colonic AVMs. The AVMs were treated with APC and clipping.  Anticoagulation was restarted after hemoglobin stabilized and he ultimately underwent TEE guided cardioversion back into sinus rhythm. TEE confirmed the presence of severe mitral stenosis with mean transvalvular gradient measured 14 mmHg. He was also treated with ciprofloxacin for urinary tract infection. He was discharged home and seen in follow-up yesterday by Dr. Shirlee Latch. The patient remained in sinus rhythm and hemoglobin had gone up to 10.0. He returns to our office today with hopes to proceed with redo mitral valve replacement in the near future. The patient reports that he has been doing well since hospital discharge. He has gradually improved in strength. He still gets short of breath with activity, but he is doing much better than he was at the time of his last presentation in early October.  He denies any fevers, chills, or productive cough. He has not seen any blood in his stool. He has no symptoms of dysuria or hematuria.    Past Medical History:  Diagnosis Date  . Atrial fibrillation (HCC)   . BPH (benign prostatic hyperplasia) 08/15/2012  . 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 Chronic diastolic (congestive) heart failure   . COPD (chronic obstructive pulmonary disease) (HCC) 08/15/2012  . Coronary artery disease 12/19/2009   PCI and stenting of RCA and OM branch  . GI bleeding 2011   while patient on coumadin and Plavix - no source identified  . Mitral stenosis   . Pulmonary hypertension   . Rheumatic heart disease 08/15/2012  . S/P Maze operation for atrial fibrillation 06/18/2010   Left  side lesion set using cryothermy with oversewing of LA appendage  . S/P mitral valve repair 06/18/2010   Complex valvuloplasty including CorMatrix patch augmentation of posterior leaflet with 28 mm Sorin Memo 3D ring annuloplasty via right mini thoractomy  . UTI (urinary tract infection) 09/07/2016   >=100,000 COLONIES/mL KLEBSIELLA OXYTOCA    Past Surgical History:  Procedure Laterality Date  . BRACHIAL EMBOLECTOMY Left 04/09/2016  . CARDIOVERSION N/A 09/09/2016   Procedure: CARDIOVERSION;  Surgeon: Laurey Morale, MD;  Location: [facility] ENDOSCOPY;  Service: Cardiovascular;  Laterality: N/A;  . COLONOSCOPY N/A 09/03/2016   Procedure: COLONOSCOPY;  Surgeon: Napoleon Form, MD;  Location: [facility] ENDOSCOPY;  Service: Endoscopy;  Laterality: N/A;  . ENTEROSCOPY N/A 09/02/2016   Procedure: ENTEROSCOPY;  Surgeon: Napoleon Form, MD;  Location: [facility] ENDOSCOPY;  Service: Endoscopy;  Laterality: N/A;  . GREEN LIGHT LASER TURP (TRANSURETHRAL RESECTION OF PROSTATE  05/07/2016  . MAZE  06/18/2010  . MITRAL VALVE REPAIR  06/18/2010  . PERCUTANEOUS CORONARY STENT INTERVENTION (PCI-S)  11/2009   RCA and LCx  . TEE WITHOUT CARDIOVERSION N/A 09/09/2016   Procedure: TRANSESOPHAGEAL ECHOCARDIOGRAM (TEE);  Surgeon: Laurey Morale, MD;  Location: [facility] ENDOSCOPY;  Service: Cardiovascular;  Laterality: N/A;    No family history on file.  Social History Social History  Substance  Use Topics  . Smoking status: Former Games developer  . Smokeless tobacco: Never Used  . Alcohol use No    Prior to Admission medications   Medication Sig Start Date End Date Taking? Authorizing Provider  amiodarone (PACERONE) 200 MG tablet Take 1 tablet (200 mg) twice daily x one week, then on 09/16/16 begin taking 1 tablet (200 mg) once daily. 09/09/16   Graciella Freer, PA-C  atorvastatin (LIPITOR) 20 MG tablet Take 20 mg by mouth daily at 6 PM.  07/30/12   Historical Provider, MD  budesonide-formoterol (SYMBICORT) 80-4.5 MCG/ACT  inhaler Inhale 2 puffs into the lungs 2 (two) times daily.    Historical Provider, MD  Calcium Carbonate-Vitamin D3 (CALCIUM 600/VITAMIN D) 600-400 MG-UNIT TABS Take 1 tablet by mouth daily.    Historical Provider, MD  enoxaparin (LOVENOX) 80 MG/0.8ML injection Inject 0.8 mLs (80 mg total) into the skin daily. 09/25/16 09/30/16  Purcell Nails, MD  furosemide (LASIX) 20 MG tablet Take 2 tablet (40 mg) every morning and 1 tablet (20 mg) every evening. 09/09/16   Graciella Freer, PA-C  pantoprazole (PROTONIX) 40 MG tablet Take 1 tablet (40 mg total) by mouth 2 (two) times daily. TAKE AT 0600 09/09/16   Graciella Freer, PA-C  potassium chloride (KLOR-CON 10) 10 MEQ tablet Take 2 tablets (20 mEq total) by mouth daily. 09/09/16   Graciella Freer, PA-C  sildenafil (VIAGRA) 100 MG tablet Take 100 mg by mouth daily as needed for erectile dysfunction.    Historical Provider, MD  SPIRIVA HANDIHALER 18 MCG inhalation capsule Place 18 mcg into inhaler and inhale daily.  08/13/12   Historical Provider, MD    No Known Allergies    Review of Systems:              General:                      normal appetite, normal energy, no weight gain, + 9 lb weight loss, no fever             Cardiac:                       no chest pain with exertion, no chest pain at rest, + SOB with exertion, no resting SOB, no PND, no orthopnea, no palpitations, no arrhythmia, + remote h/o atrial fibrillation, no LE edema, no dizzy spells, no syncope             Respiratory:                 + shortness of breath, no home oxygen, no productive cough, no dry cough, no bronchitis, no wheezing, no hemoptysis, no asthma, no pain with inspiration or cough, no sleep apnea, no CPAP at night             GI:                               no difficulty swallowing, no reflux, no frequent heartburn, no hiatal hernia, no abdominal pain, no constipation, no diarrhea, no hematochezia, no hematemesis, no melena             GU:                               no dysuria,  no frequency, no urinary tract  infection, no hematuria, no enlarged prostate, no kidney stones, no kidney disease             Vascular:                     no pain suggestive of claudication, no pain in feet, + leg cramps, no varicose veins, no DVT, no non-healing foot ulcer             Neuro:                         no stroke, no TIA's, no seizures, no headaches, no temporary blindness one eye,  no slurred speech, no peripheral neuropathy, no chronic pain, no instability of gait, no memory/cognitive dysfunction             Musculoskeletal:         no arthritis, no joint swelling, no myalgias, no difficulty walking, normal mobility              Skin:                            no rash, no itching, no skin infections, no pressure sores or ulcerations             Psych:                         no anxiety, no depression, no nervousness, no unusual recent stress             Eyes:                           no blurry vision, no floaters, no recent vision changes, does not wear glasses or contacts             ENT:                            no hearing loss, no loose or painful teeth, full set dentures, last saw dentist 6 months ago             Hematologic:               no easy bruising, no abnormal bleeding, no clotting disorder, no frequent epistaxis             Endocrine:                   no diabetes, does not check CBG's at home                           Physical Exam:              BP 118/66   Pulse (!) 58   Resp 16   Ht 5' 11.5" (1.816 m)   Wt 165 lb (74.8 kg)   SpO2 98% Comment: ON RA  BMI 22.69 kg/m              General:                      Thin,  well-appearing             HEENT:  Unremarkable              Neck:                           no JVD, no bruits, no adenopathy              Chest:                          clear to auscultation, symmetrical breath sounds, no wheezes, no rhonchi              CV:                               RRR, no murmur              Abdomen:                    soft, non-tender, no masses              Extremities:                 warm, well-perfused, pulses palpable, no LE edema             Rectal/GU                   Deferred             Neuro:                         Grossly non-focal and symmetrical throughout             Skin:                            Clean and dry, no rashes, no breakdown   Diagnostic Tests:  TRANSTHORACIC ECHOCARDIOGRAM  Both the images and report from transthoracic echocardiogram performed 04/10/2016 are reviewed. There is left ventricular hypertrophy with normal left ventricular systolic function, left ventricular ejection fraction estimated 55-60%. There is a mitral annuloplasty ring in place consistent with previous mitral repair. There is no significant mitral regurgitation. There is mitral annular calcification with calcification of the mitral valve leaflets and "moderate to severe functional stenosis". Mitral valve area was calculated 0.85 cm. Mean transvalvular gradient ranged between 12 and 17 mmHg with peak velocity across the valve greater than 3 m/s. There was mild left atrial enlargement. Right ventricular size and function appeared normal. There was mild tricuspid regurgitation.   CARDIAC CATHETERIZATION  Both images and report from diagnostic cardiac catheterization performed 07/10/2016 are reviewed. The patient has mild nonobstructive coronary artery disease. There are widely patent stents in both the left circumflex and the right coronary arteries. There is normal left ventricular systolic function. There is severe pulmonary hypertension with PA pressures measured 72/26. Central venous pressure was 4. Mean pulmonary Wedge pressure was 27. Transvalvular gradient across mitral valve was estimated 20 mmHg. Cardiac output was 3.9 L/m using thermodilution.   TRANSESOPHAGEAL ECHOCARDIOGRAM  Images from transesophageal echocardiogram performed  07/22/2016 are personally reviewed. The patient has previously undergone complex mitral valve repair for rheumatic mitral valve disease. There is mild to moderate residual mitral regurgitation with 2 jets that are directed somewhat eccentrically across the left atrium. There is moderate thickening with severely restricted leaflet mobility involving the  anterior leaflet of the mitral valve. The posterior leaflet seems to move somewhat better but it also is somewhat restricted. There is severe mitral stenosis. There is no left atrial thrombus. The left atrial appendage has been obliterated. Left ventricular function appears normal.  The aortic valve appears normal. There is trace tricuspid regurgitation.   Transesophageal Echocardiography  Patient:    Carlos Norton, Carlos Norton MR #:       [id number] Study Date: 09/09/2016 Gender:     M Age:        75 Height:     180.3 cm Weight:     71.8 kg BSA:        1.89 m^2 Pt. Status: Room:       [facility]   ADMITTING    Marca Ancona, M.D.  ATTENDING    Marca Ancona, M.D.  ORDERING     Marca Ancona, M.D.  PERFORMING   Marca Ancona, M.D.  REFERRING    Marca Ancona, M.D.  SONOGRAPHER  Lysbeth Galas, RDCS  cc:  -------------------------------------------------------------------  ------------------------------------------------------------------- Indications:      Atrial flutter 427.32.  ------------------------------------------------------------------- History:   PMH:   Atrial fibrillation.  Coronary artery disease. Mitral valve disease.  Chronic obstructive pulmonary disease. Complications of prior valve surgery.  ------------------------------------------------------------------- Study Conclusions  - Left ventricle: Normal LV size and thickness. EF 50-55%, low   normal to mildly decreased systolic function. Wall motion was   normal; there were no regional wall motion abnormalities. - Aortic valve: There was no stenosis. - Aorta: The thoracic  aorta is normal in caliber with mild plaque. - Mitral valve: The mitral valve is status post repair. The   posterior leaflet is restricted. There is mild eccentric mitral   regurgitation. There is moderate to severe mitral stenosis with   valve area 1.4 cm^2 by PHT and mean gradient 13 mmHg. - Left atrium: The atrium was moderately dilated. No evidence of   thrombus in the atrial cavity or appendage. - Right ventricle: The RV was poorly visualized. Probably normal in   size with mildly decreased systolic function. - Right atrium: No evidence of thrombus in the atrial cavity or   appendage. - Atrial septum: No defect or patent foramen ovale was identified.  Impressions:  - Proceed to DCCV.  ------------------------------------------------------------------- Study data:   Study status:  Routine.  Consent:  The risks, benefits, and alternatives to the procedure were explained to the patient and informed consent was obtained.  Procedure:  The patient reported no pain pre or post test. Initial setup. The patient was brought to the laboratory. Surface ECG leads were monitored. Sedation. Deep sedation was administered by anesthesiology staff. Sedation was reversed at the end of the procedure. Transesophageal echocardiography. An adult multiplane transesophageal probe was inserted by the attending cardiologistwithout difficulty. Image quality was adequate.  Study completion:  The patient tolerated the procedure well. There were no complications.  Administered medications:   Propofol, 160mg , IV.          Diagnostic transesophageal echocardiography.  2D and color Doppler. Birthdate:  Patient birthdate: [birth year].  Age:  Patient is 75 yr old.  Sex:  Gender: male.    BMI: 22.1 kg/m^2.  Blood pressure: 93/73  Patient status:  Inpatient.  Study date:  Study date: 09/09/2016. Study time: 10:22 AM.  Location:   Endoscopy.  -------------------------------------------------------------------  ------------------------------------------------------------------- Left ventricle:  Normal LV size and thickness. EF 50-55%, low normal to mildly decreased systolic function. Wall motion was normal; there were no regional  wall motion abnormalities.  ------------------------------------------------------------------- Aortic valve:   Trileaflet.  Doppler:   There was no stenosis. There was no regurgitation.  ------------------------------------------------------------------- Aorta:  The thoracic aorta is normal in caliber with mild plaque.   ------------------------------------------------------------------- Mitral valve:  The mitral valve is status post repair. The posterior leaflet is restricted. There is mild eccentric mitral regurgitation. There is moderate to severe mitral stenosis with valve area 1.4 cm^2 by PHT and mean gradient 13 mmHg.  Doppler: Valve area by pressure half-time: 1.43 cm^2. Indexed valve area by pressure half-time: 0.76 cm^2/m^2.  ------------------------------------------------------------------- Left atrium:  The atrium was moderately dilated.  No evidence of thrombus in the atrial cavity or appendage.  ------------------------------------------------------------------- Atrial septum:  No defect or patent foramen ovale was identified.   ------------------------------------------------------------------- Right ventricle:  The RV was poorly visualized. Probably normal in size with mildly decreased systolic function.  ------------------------------------------------------------------- Pulmonic valve:    Doppler:  There was trivial regurgitation.  ------------------------------------------------------------------- Tricuspid valve:   Doppler:  There was mild regurgitation.  ------------------------------------------------------------------- Right atrium:  The atrium  was normal in size.  No evidence of thrombus in the atrial cavity or appendage.  ------------------------------------------------------------------- Pericardium:  There was no pericardial effusion.   ------------------------------------------------------------------- Post procedure conclusions Ascending Aorta:  - The thoracic aorta is normal in caliber with mild plaque.  ------------------------------------------------------------------- Measurements   Mitral valve                         Value  Mitral mean velocity, D              155   cm/s  Mitral pressure half-time            154   ms  Mitral valve area, PHT, DP           1.43  cm^2  Mitral valve area/bsa, PHT, DP       0.76  cm^2/m^2  Mitral annulus VTI, D                54    cm    Tricuspid valve                      Value  Tricuspid regurg peak velocity       309   cm/s  Tricuspid peak RV-RA gradient        38    mm Hg  Legend: (L)  and  (H)  mark values outside specified reference range.  ------------------------------------------------------------------- Prepared and Electronically Authenticated by  Marca Ancona, M.D. 2017-10-27T14:40:21    Impression:  Patient has long-standing rheumatic heart disease with stage D severe symptomatic mitral stenosis and moderate mitral regurgitation, status post mitral valve repair in 2011. Left ventricular systolic function remains preserved. There is no sign of left atrial thrombus. Options include continued medical therapy versus redo mitral valve replacement.   Plan:  The patient and his wife wereagain counseled at length regarding the indications, risks and potential benefits of redo mitral valve replacement. The rationale for elective surgery has been explained, including a comparison between surgery, balloon mitral valvuloplasty,and continued medical therapy with close follow-up. We discussed the possibility of replacing the mitral valve using a mechanical  prosthesis with the attendant need for long-term anticoagulation versus the alternative of replacing it using a bioprosthetic tissue valve with its potential for late structural valve deterioration and failure, depending upon the patient's longevity. The patient specifically requests that hismitral valve be  replaced using a bioprosthetic tissuevalve. The patient understands and accepts all potential risks of surgery including but not limited to risk of death, stroke or other neurologic complication, myocardial infarction, congestive heart failure, respiratory failure, renal failure, bleeding requiring transfusion and/or reexploration, arrhythmia, infection or other wound complications, pneumonia, pleural and/or pericardial effusion, pulmonary embolus, aortic dissection or other major vascular complication, or delayed complications related to valve repair or replacement including but not limited to structural valve deterioration and failure, thrombosis, embolization, endocarditis, or paravalvular leak. Expectations for the patient's postoperative convalescence a been discussed. All of their questions have been answered.  We plan to proceed with redo mitral valve replacement using a bioprosthetic tissue valve on 10/01/2016. The patient has been instructed to stop taking Eliquis tomorrow. He will begin Lovenox injections the following day. He will otherwise remain on all current medications without change until the morning of surgery. All of his questions have been addressed.     Salvatore Decentlarence H. Cornelius Moraswen, MD 09/23/2016 3:31 PM

## 2016-09-29 ENCOUNTER — Ambulatory Visit (HOSPITAL_COMMUNITY)
Admission: RE | Admit: 2016-09-29 | Discharge: 2016-09-29 | Disposition: A | Discharge status: Home / Self Care | Payer: Medicare Other | Source: Ambulatory Visit | Attending: Thoracic Surgery (Cardiothoracic Vascular Surgery) | Admitting: Thoracic Surgery (Cardiothoracic Vascular Surgery)

## 2016-09-29 ENCOUNTER — Encounter (HOSPITAL_COMMUNITY): Payer: Self-pay

## 2016-09-29 ENCOUNTER — Encounter (HOSPITAL_COMMUNITY)
Admit: 2016-09-29 | Discharge: 2016-09-29 | Disposition: A | Discharge status: Home / Self Care | Payer: Medicare Other | Attending: Thoracic Surgery (Cardiothoracic Vascular Surgery) | Admitting: Thoracic Surgery (Cardiothoracic Vascular Surgery)

## 2016-09-29 DIAGNOSIS — I340 Nonrheumatic mitral (valve) insufficiency: Secondary | ICD-10-CM

## 2016-09-29 DIAGNOSIS — I099 Rheumatic heart disease, unspecified: Secondary | ICD-10-CM | POA: Insufficient documentation

## 2016-09-29 DIAGNOSIS — Z955 Presence of coronary angioplasty implant and graft: Secondary | ICD-10-CM | POA: Insufficient documentation

## 2016-09-29 DIAGNOSIS — I071 Rheumatic tricuspid insufficiency: Secondary | ICD-10-CM

## 2016-09-29 DIAGNOSIS — I5032 Chronic diastolic (congestive) heart failure: Secondary | ICD-10-CM | POA: Insufficient documentation

## 2016-09-29 DIAGNOSIS — N400 Enlarged prostate without lower urinary tract symptoms: Secondary | ICD-10-CM | POA: Insufficient documentation

## 2016-09-29 DIAGNOSIS — I050 Rheumatic mitral stenosis: Secondary | ICD-10-CM | POA: Insufficient documentation

## 2016-09-29 DIAGNOSIS — G5600 Carpal tunnel syndrome, unspecified upper limb: Secondary | ICD-10-CM | POA: Insufficient documentation

## 2016-09-29 DIAGNOSIS — K219 Gastro-esophageal reflux disease without esophagitis: Secondary | ICD-10-CM | POA: Insufficient documentation

## 2016-09-29 DIAGNOSIS — I2510 Atherosclerotic heart disease of native coronary artery without angina pectoris: Secondary | ICD-10-CM | POA: Insufficient documentation

## 2016-09-29 DIAGNOSIS — Z87891 Personal history of nicotine dependence: Secondary | ICD-10-CM | POA: Insufficient documentation

## 2016-09-29 DIAGNOSIS — J449 Chronic obstructive pulmonary disease, unspecified: Secondary | ICD-10-CM | POA: Insufficient documentation

## 2016-09-29 DIAGNOSIS — I480 Paroxysmal atrial fibrillation: Secondary | ICD-10-CM | POA: Insufficient documentation

## 2016-09-29 DIAGNOSIS — I272 Pulmonary hypertension, unspecified: Secondary | ICD-10-CM

## 2016-09-29 HISTORY — DX: Gastro-esophageal reflux disease without esophagitis: K21.9

## 2016-09-29 HISTORY — DX: Dyspnea, unspecified: R06.00

## 2016-09-29 HISTORY — DX: Personal history of other medical treatment: Z92.89

## 2016-09-29 LAB — SURGICAL PCR SCREEN
MRSA, PCR: NEGATIVE
Staphylococcus aureus: NEGATIVE

## 2016-09-29 LAB — APTT: aPTT: 48 seconds — ABNORMAL HIGH (ref 24–36)

## 2016-09-29 LAB — URINALYSIS, ROUTINE W REFLEX MICROSCOPIC
Bilirubin Urine: NEGATIVE
Glucose, UA: NEGATIVE mg/dL
HGB URINE DIPSTICK: NEGATIVE
Ketones, ur: NEGATIVE mg/dL
LEUKOCYTES UA: NEGATIVE
Nitrite: NEGATIVE
PROTEIN: NEGATIVE mg/dL
Specific Gravity, Urine: 1.011 (ref 1.005–1.030)
pH: 7.5 (ref 5.0–8.0)

## 2016-09-29 LAB — CBC
HEMATOCRIT: 34.3 % — AB (ref 39.0–52.0)
HEMOGLOBIN: 10 g/dL — AB (ref 13.0–17.0)
MCH: 23.9 pg — ABNORMAL LOW (ref 26.0–34.0)
MCHC: 29.2 g/dL — ABNORMAL LOW (ref 30.0–36.0)
MCV: 81.9 fL (ref 78.0–100.0)
Platelets: 182 10*3/uL (ref 150–400)
RBC: 4.19 MIL/uL — ABNORMAL LOW (ref 4.22–5.81)
RDW: 25.6 % — ABNORMAL HIGH (ref 11.5–15.5)
WBC: 7.8 10*3/uL (ref 4.0–10.5)

## 2016-09-29 LAB — COMPREHENSIVE METABOLIC PANEL
ALBUMIN: 3.4 g/dL — AB (ref 3.5–5.0)
ALK PHOS: 117 U/L (ref 38–126)
ALT: 13 U/L — AB (ref 17–63)
AST: 29 U/L (ref 15–41)
Anion gap: 9 (ref 5–15)
BILIRUBIN TOTAL: 0.9 mg/dL (ref 0.3–1.2)
BUN: 16 mg/dL (ref 6–20)
CALCIUM: 9.6 mg/dL (ref 8.9–10.3)
CO2: 28 mmol/L (ref 22–32)
Chloride: 100 mmol/L — ABNORMAL LOW (ref 101–111)
Creatinine, Ser: 1.37 mg/dL — ABNORMAL HIGH (ref 0.61–1.24)
GFR calc Af Amer: 57 mL/min — ABNORMAL LOW (ref >60)
GFR calc non Af Amer: 50 mL/min — ABNORMAL LOW (ref >60)
GLUCOSE: 86 mg/dL (ref 65–99)
Potassium: 3.7 mmol/L (ref 3.5–5.1)
Sodium: 137 mmol/L (ref 135–145)
TOTAL PROTEIN: 7.4 g/dL (ref 6.5–8.1)

## 2016-09-29 LAB — BLOOD GAS, ARTERIAL
Acid-Base Excess: 4.7 mmol/L — ABNORMAL HIGH (ref 0.0–2.0)
Bicarbonate: 27.9 mmol/L (ref 20.0–28.0)
DRAWN BY: 205171
FIO2: 0.21
O2 Saturation: 94.8 %
PATIENT TEMPERATURE: 98.6
PO2 ART: 69.5 mmHg — AB (ref 83.0–108.0)
pCO2 arterial: 35.8 mmHg (ref 32.0–48.0)
pH, Arterial: 7.504 — ABNORMAL HIGH (ref 7.350–7.450)

## 2016-09-29 LAB — PROTIME-INR
INR: 1.17
Prothrombin Time: 15 seconds (ref 11.4–15.2)

## 2016-09-29 NOTE — Progress Notes (Signed)
PCP - Dr. Tarri Fullerichard Escajeda Cardiologist - Dr. Eulah CitizenAndrew Chiu  EKG- 09/22/16 CXR - 09/29/16  Echo - 08/2016 Cardiac Cath - 07/10/16  Patient denies chest pain and acute shortness of breath at PAT appointment.  Per patient, last dose of Eliquis was 09/24/16 and started Lovenox injections on 09/25/16.  Last dose of Lovenox injection will be 09/30/16.    Shonna ChockAllison Zelenak, PA consulted during appointment but informed nurse that she did not need to see the patient but will review the chart.    Blood bank made aware that patient received 2 units of blood on 09/01/16.

## 2016-09-29 NOTE — Pre-Procedure Instructions (Signed)
Lamount Cohendward L Scheidt  09/29/2016      [facility] - [place], [place] - [id number] [place] [id number] Phone: [phone] Fax: [phone]    Your procedure is scheduled on Thursday, November 2nd, 2017.  [facility] Ad[facility] at 5:30 A.M.   Call this number if you have problems the morning of surgery:  [phone]   Remember:  Do not eat food or drink liquids after midnight.   Take these medicines the morning of surgery with A SIP OF WATER: Amiodarone (Pacerone), Symbicort Inhaler (please bring with you), Pantoprazole (Protonix), Spiriva Inhaler (please bring with you).  Stop taking: NSAIDS, Aleve, Naproxen, Ibuprofen, Advil, Motrin, BC's, Goody's, Fish oil, all herbal medications, and all vitamins.    Do not wear jewelry.  Do not wear lotions, powders, or colognes, or deoderant.  Men may shave face and neck.  Do not bring valuables to the hospital.  [facility] is not responsible for any belongings or valuables.  Contacts, dentures or bridgework may not be worn into surgery.  Leave your suitcase in the car.  After surgery it may be brought to your room.  For patients admitted to the hospital, discharge time will be determined by your treatment team.  Patients discharged the day of surgery will not be allowed to drive home.   Special instructions:  Preparing for Surgery.   [place]- Preparing For Surgery  Before surgery, you can play an important role. Because skin is not sterile, your skin needs to be as free of germs as possible. You can reduce the number of germs on your skin by washing with CHG (chlorahexidine gluconate) Soap before surgery.  CHG is an antiseptic cleaner which kills germs and bonds with the skin to continue killing germs even after washing.  Please do not use if you have an allergy to CHG or antibacterial soaps. If your skin becomes reddened/irritated stop using the CHG.  Do not shave (including  legs and underarms) for at least 48 hours prior to first CHG shower. It is OK to shave your face.  Please follow these instructions carefully.   1. Shower the NIGHT BEFORE SURGERY and the MORNING OF SURGERY with CHG.   2. If you chose to wash your hair, wash your hair first as usual with your normal shampoo.  3. After you shampoo, rinse your hair and body thoroughly to remove the shampoo.  4. Use CHG as you would any other liquid soap. You can apply CHG directly to the skin and wash gently with a scrungie or a clean washcloth.   5. Apply the CHG Soap to your body ONLY FROM THE NECK DOWN.  Do not use on open wounds or open sores. Avoid contact with your eyes, ears, mouth and genitals (private parts). Wash genitals (private parts) with your normal soap.  6. Wash thoroughly, paying special attention to the area where your surgery will be performed.  7. Thoroughly rinse your body with warm water from the neck down.  8. DO NOT shower/wash with your normal soap after using and rinsing off the CHG Soap.  9. Pat yourself dry with a CLEAN TOWEL.   10. Wear CLEAN PAJAMAS   11. Place CLEAN SHEETS on your bed the night of your first shower and DO NOT SLEEP WITH PETS.  Day of Surgery: Do not apply any deodorants/lotions. Please wear clean clothes to the hospital/surgery center.     Please read over  the following fact sheets that you were given. MRSA Information, Incentive Spirometry

## 2016-09-29 NOTE — Progress Notes (Signed)
Notified Ryan at Dr. Orvan Julywen's office of patient's labs.

## 2016-09-30 LAB — HEMOGLOBIN A1C
Hgb A1c MFr Bld: 4.8 % (ref 4.8–5.6)
Mean Plasma Glucose: 91 mg/dL

## 2016-09-30 MED ORDER — SODIUM CHLORIDE 0.9 % IV SOLN
INTRAVENOUS | Status: AC
  Administered 2016-10-01: .7 [IU]/h via INTRAVENOUS
  Filled 2016-09-30: qty 2.5

## 2016-09-30 MED ORDER — DEXMEDETOMIDINE HCL IN NACL 400 MCG/100ML IV SOLN
0.1000 ug/kg/h | INTRAVENOUS | Status: AC
  Administered 2016-10-01: .3 ug/kg/h via INTRAVENOUS
  Filled 2016-09-30: qty 100

## 2016-09-30 MED ORDER — MAGNESIUM SULFATE 50 % IJ SOLN
40.0000 meq | INTRAMUSCULAR | Status: DC
  Filled 2016-09-30: qty 10

## 2016-09-30 MED ORDER — DEXTROSE 5 % IV SOLN
750.0000 mg | INTRAVENOUS | Status: DC
  Filled 2016-09-30: qty 750

## 2016-09-30 MED ORDER — SODIUM CHLORIDE 0.9 % IV SOLN
INTRAVENOUS | Status: DC
  Filled 2016-09-30: qty 30

## 2016-09-30 MED ORDER — POTASSIUM CHLORIDE 2 MEQ/ML IV SOLN
80.0000 meq | INTRAVENOUS | Status: DC
  Filled 2016-09-30: qty 40

## 2016-09-30 MED ORDER — VANCOMYCIN HCL 10 G IV SOLR
1250.0000 mg | INTRAVENOUS | Status: AC
  Administered 2016-10-01: 1250 mg via INTRAVENOUS
  Filled 2016-09-30: qty 1250

## 2016-09-30 MED ORDER — NITROGLYCERIN IN D5W 200-5 MCG/ML-% IV SOLN
2.0000 ug/min | INTRAVENOUS | Status: AC
  Administered 2016-10-01: 16.67 ug/min via INTRAVENOUS
  Filled 2016-09-30: qty 250

## 2016-09-30 MED ORDER — DOPAMINE-DEXTROSE 3.2-5 MG/ML-% IV SOLN
0.0000 ug/kg/min | INTRAVENOUS | Status: DC
  Filled 2016-09-30: qty 250

## 2016-09-30 MED ORDER — METOPROLOL TARTRATE 12.5 MG HALF TABLET
12.5000 mg | ORAL_TABLET | Freq: Once | ORAL | Status: AC
  Administered 2016-10-01: 12.5 mg via ORAL
  Filled 2016-09-30: qty 1

## 2016-09-30 MED ORDER — TRANEXAMIC ACID (OHS) BOLUS VIA INFUSION
15.0000 mg/kg | INTRAVENOUS | Status: AC
  Administered 2016-10-01: 1015.5 mg via INTRAVENOUS
  Filled 2016-09-30: qty 1016

## 2016-09-30 MED ORDER — TRANEXAMIC ACID (OHS) PUMP PRIME SOLUTION
2.0000 mg/kg | INTRAVENOUS | Status: DC
  Filled 2016-09-30: qty 1.35

## 2016-09-30 MED ORDER — VANCOMYCIN HCL 1000 MG IV SOLR
INTRAVENOUS | Status: AC
  Administered 2016-10-01: 1000 mL
  Filled 2016-09-30: qty 1000

## 2016-09-30 MED ORDER — CHLORHEXIDINE GLUCONATE 0.12 % MT SOLN
15.0000 mL | Freq: Once | OROMUCOSAL | Status: AC
  Administered 2016-10-01: 15 mL via OROMUCOSAL
  Filled 2016-09-30: qty 15

## 2016-09-30 MED ORDER — PLASMA-LYTE 148 IV SOLN
INTRAVENOUS | Status: AC
  Administered 2016-10-01: 500 mL
  Filled 2016-09-30: qty 2.5

## 2016-09-30 MED ORDER — TRANEXAMIC ACID 1000 MG/10ML IV SOLN
1.5000 mg/kg/h | INTRAVENOUS | Status: AC
  Administered 2016-10-01: 1.5 mg/kg/h via INTRAVENOUS
  Filled 2016-09-30: qty 25

## 2016-09-30 MED ORDER — DEXTROSE 5 % IV SOLN
1.5000 g | INTRAVENOUS | Status: AC
  Administered 2016-10-01: 1.5 g via INTRAVENOUS
  Administered 2016-10-01: .75 g via INTRAVENOUS
  Filled 2016-09-30 (×2): qty 1.5

## 2016-09-30 MED ORDER — PHENYLEPHRINE HCL 10 MG/ML IJ SOLN
30.0000 ug/min | INTRAVENOUS | Status: AC
  Administered 2016-10-01: 30 ug/min via INTRAVENOUS
  Filled 2016-09-30: qty 2

## 2016-09-30 MED ORDER — DEXTROSE 5 % IV SOLN
0.0000 ug/min | INTRAVENOUS | Status: DC
  Filled 2016-09-30: qty 4
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
## 2016-10-01 ENCOUNTER — Inpatient Hospital Stay (HOSPITAL_COMMUNITY): Payer: Medicare Other | Admitting: Vascular Surgery

## 2016-10-01 ENCOUNTER — Inpatient Hospital Stay (HOSPITAL_COMMUNITY): Payer: Medicare Other

## 2016-10-01 ENCOUNTER — Encounter (HOSPITAL_COMMUNITY): Payer: Self-pay | Admitting: *Deleted

## 2016-10-01 ENCOUNTER — Inpatient Hospital Stay (HOSPITAL_COMMUNITY)
Admission: RE | Admit: 2016-10-01 | Discharge: 2016-10-08 | DRG: 220 | Disposition: A | Discharge status: Home / Self Care | Payer: Medicare Other | Source: Ambulatory Visit | Attending: Thoracic Surgery (Cardiothoracic Vascular Surgery) | Admitting: Thoracic Surgery (Cardiothoracic Vascular Surgery)

## 2016-10-01 ENCOUNTER — Encounter (HOSPITAL_COMMUNITY)
Admission: RE | Disposition: A | Discharge status: Home / Self Care | Payer: Self-pay | Source: Ambulatory Visit | Attending: Thoracic Surgery (Cardiothoracic Vascular Surgery)

## 2016-10-01 DIAGNOSIS — Z87891 Personal history of nicotine dependence: Secondary | ICD-10-CM

## 2016-10-01 DIAGNOSIS — Y713 Surgical instruments, materials and cardiovascular devices (including sutures) associated with adverse incidents: Secondary | ICD-10-CM | POA: Diagnosis present

## 2016-10-01 DIAGNOSIS — T8203XA Leakage of heart valve prosthesis, initial encounter: Principal | ICD-10-CM | POA: Diagnosis present

## 2016-10-01 DIAGNOSIS — I5032 Chronic diastolic (congestive) heart failure: Secondary | ICD-10-CM | POA: Diagnosis present

## 2016-10-01 DIAGNOSIS — I481 Persistent atrial fibrillation: Secondary | ICD-10-CM | POA: Diagnosis present

## 2016-10-01 DIAGNOSIS — I440 Atrioventricular block, first degree: Secondary | ICD-10-CM | POA: Diagnosis present

## 2016-10-01 DIAGNOSIS — I340 Nonrheumatic mitral (valve) insufficiency: Secondary | ICD-10-CM

## 2016-10-01 DIAGNOSIS — I081 Rheumatic disorders of both mitral and tricuspid valves: Secondary | ICD-10-CM | POA: Diagnosis present

## 2016-10-01 DIAGNOSIS — I050 Rheumatic mitral stenosis: Secondary | ICD-10-CM | POA: Diagnosis present

## 2016-10-01 DIAGNOSIS — I052 Rheumatic mitral stenosis with insufficiency: Secondary | ICD-10-CM | POA: Diagnosis present

## 2016-10-01 DIAGNOSIS — Z952 Presence of prosthetic heart valve: Secondary | ICD-10-CM

## 2016-10-01 DIAGNOSIS — I5033 Acute on chronic diastolic (congestive) heart failure: Secondary | ICD-10-CM | POA: Diagnosis present

## 2016-10-01 DIAGNOSIS — K5900 Constipation, unspecified: Secondary | ICD-10-CM | POA: Diagnosis present

## 2016-10-01 DIAGNOSIS — D6959 Other secondary thrombocytopenia: Secondary | ICD-10-CM | POA: Diagnosis present

## 2016-10-01 DIAGNOSIS — J449 Chronic obstructive pulmonary disease, unspecified: Secondary | ICD-10-CM | POA: Diagnosis present

## 2016-10-01 DIAGNOSIS — I360 Nonrheumatic tricuspid (valve) stenosis: Secondary | ICD-10-CM | POA: Diagnosis not present

## 2016-10-01 DIAGNOSIS — J9811 Atelectasis: Secondary | ICD-10-CM | POA: Diagnosis not present

## 2016-10-01 DIAGNOSIS — Z7901 Long term (current) use of anticoagulants: Secondary | ICD-10-CM | POA: Diagnosis not present

## 2016-10-01 DIAGNOSIS — I272 Pulmonary hypertension, unspecified: Secondary | ICD-10-CM | POA: Diagnosis present

## 2016-10-01 DIAGNOSIS — E876 Hypokalemia: Secondary | ICD-10-CM | POA: Diagnosis present

## 2016-10-01 DIAGNOSIS — I099 Rheumatic heart disease, unspecified: Secondary | ICD-10-CM | POA: Diagnosis present

## 2016-10-01 DIAGNOSIS — D62 Acute posthemorrhagic anemia: Secondary | ICD-10-CM | POA: Diagnosis not present

## 2016-10-01 DIAGNOSIS — I2510 Atherosclerotic heart disease of native coronary artery without angina pectoris: Secondary | ICD-10-CM | POA: Diagnosis present

## 2016-10-01 DIAGNOSIS — T8182XA Emphysema (subcutaneous) resulting from a procedure, initial encounter: Secondary | ICD-10-CM | POA: Diagnosis not present

## 2016-10-01 DIAGNOSIS — I509 Heart failure, unspecified: Secondary | ICD-10-CM

## 2016-10-01 DIAGNOSIS — N400 Enlarged prostate without lower urinary tract symptoms: Secondary | ICD-10-CM | POA: Diagnosis present

## 2016-10-01 DIAGNOSIS — Z953 Presence of xenogenic heart valve: Secondary | ICD-10-CM

## 2016-10-01 DIAGNOSIS — I4892 Unspecified atrial flutter: Secondary | ICD-10-CM | POA: Diagnosis present

## 2016-10-01 DIAGNOSIS — Z9889 Other specified postprocedural states: Secondary | ICD-10-CM

## 2016-10-01 DIAGNOSIS — Z8679 Personal history of other diseases of the circulatory system: Secondary | ICD-10-CM

## 2016-10-01 DIAGNOSIS — Z955 Presence of coronary angioplasty implant and graft: Secondary | ICD-10-CM | POA: Diagnosis not present

## 2016-10-01 DIAGNOSIS — I051 Rheumatic mitral insufficiency: Secondary | ICD-10-CM | POA: Diagnosis present

## 2016-10-01 HISTORY — PX: TEE WITHOUT CARDIOVERSION: SHX5443

## 2016-10-01 HISTORY — DX: Other specified postprocedural states: Z98.890

## 2016-10-01 HISTORY — DX: Presence of xenogenic heart valve: Z95.3

## 2016-10-01 HISTORY — PX: MITRAL VALVE REPLACEMENT: SHX147

## 2016-10-01 HISTORY — PX: TRICUSPID VALVE REPLACEMENT: SHX816

## 2016-10-01 LAB — CREATININE, SERUM: Creatinine, Ser: 0.99 mg/dL (ref 0.61–1.24)

## 2016-10-01 LAB — POCT I-STAT, CHEM 8
BUN: 19 mg/dL (ref 6–20)
BUN: 17 mg/dL (ref 6–20)
BUN: 19 mg/dL (ref 6–20)
BUN: 16 mg/dL (ref 6–20)
BUN: 17 mg/dL (ref 6–20)
BUN: 16 mg/dL (ref 6–20)
BUN: 16 mg/dL (ref 6–20)
BUN: 18 mg/dL (ref 6–20)
BUN: 15 mg/dL (ref 6–20)
CALCIUM ION: 1.18 mmol/L (ref 1.15–1.40)
CALCIUM ION: 1.1 mmol/L — AB (ref 1.15–1.40)
CALCIUM ION: 1.02 mmol/L — AB (ref 1.15–1.40)
CALCIUM ION: 1.06 mmol/L — AB (ref 1.15–1.40)
CALCIUM ION: 1.01 mmol/L — AB (ref 1.15–1.40)
CALCIUM ION: 1.03 mmol/L — AB (ref 1.15–1.40)
CALCIUM ION: 0.91 mmol/L — AB (ref 1.15–1.40)
CALCIUM ION: 1.16 mmol/L (ref 1.15–1.40)
CHLORIDE: 98 mmol/L — AB (ref 101–111)
CHLORIDE: 96 mmol/L — AB (ref 101–111)
CHLORIDE: 97 mmol/L — AB (ref 101–111)
CHLORIDE: 97 mmol/L — AB (ref 101–111)
CREATININE: 1.2 mg/dL (ref 0.61–1.24)
CREATININE: 1 mg/dL (ref 0.61–1.24)
CREATININE: 0.9 mg/dL (ref 0.61–1.24)
CREATININE: 0.9 mg/dL (ref 0.61–1.24)
CREATININE: 0.9 mg/dL (ref 0.61–1.24)
CREATININE: 1 mg/dL (ref 0.61–1.24)
Calcium, Ion: 1.24 mmol/L (ref 1.15–1.40)
Chloride: 98 mmol/L — ABNORMAL LOW (ref 101–111)
Chloride: 98 mmol/L — ABNORMAL LOW (ref 101–111)
Chloride: 97 mmol/L — ABNORMAL LOW (ref 101–111)
Chloride: 97 mmol/L — ABNORMAL LOW (ref 101–111)
Chloride: 100 mmol/L — ABNORMAL LOW (ref 101–111)
Creatinine, Ser: 1.1 mg/dL (ref 0.61–1.24)
Creatinine, Ser: 0.9 mg/dL (ref 0.61–1.24)
Creatinine, Ser: 0.9 mg/dL (ref 0.61–1.24)
GLUCOSE: 97 mg/dL (ref 65–99)
GLUCOSE: 100 mg/dL — AB (ref 65–99)
GLUCOSE: 105 mg/dL — AB (ref 65–99)
GLUCOSE: 112 mg/dL — AB (ref 65–99)
GLUCOSE: 119 mg/dL — AB (ref 65–99)
GLUCOSE: 187 mg/dL — AB (ref 65–99)
GLUCOSE: 192 mg/dL — AB (ref 65–99)
GLUCOSE: 137 mg/dL — AB (ref 65–99)
Glucose, Bld: 105 mg/dL — ABNORMAL HIGH (ref 65–99)
HCT: 27 % — ABNORMAL LOW (ref 39.0–52.0)
HCT: 22 % — ABNORMAL LOW (ref 39.0–52.0)
HCT: 27 % — ABNORMAL LOW (ref 39.0–52.0)
HCT: 24 % — ABNORMAL LOW (ref 39.0–52.0)
HCT: 25 % — ABNORMAL LOW (ref 39.0–52.0)
HCT: 22 % — ABNORMAL LOW (ref 39.0–52.0)
HCT: 27 % — ABNORMAL LOW (ref 39.0–52.0)
HCT: 30 % — ABNORMAL LOW (ref 39.0–52.0)
HCT: 29 % — ABNORMAL LOW (ref 39.0–52.0)
HEMOGLOBIN: 8.5 g/dL — AB (ref 13.0–17.0)
HEMOGLOBIN: 7.5 g/dL — AB (ref 13.0–17.0)
HEMOGLOBIN: 9.2 g/dL — AB (ref 13.0–17.0)
HEMOGLOBIN: 10.2 g/dL — AB (ref 13.0–17.0)
Hemoglobin: 9.2 g/dL — ABNORMAL LOW (ref 13.0–17.0)
Hemoglobin: 7.5 g/dL — ABNORMAL LOW (ref 13.0–17.0)
Hemoglobin: 9.2 g/dL — ABNORMAL LOW (ref 13.0–17.0)
Hemoglobin: 8.2 g/dL — ABNORMAL LOW (ref 13.0–17.0)
Hemoglobin: 9.9 g/dL — ABNORMAL LOW (ref 13.0–17.0)
POTASSIUM: 3.5 mmol/L (ref 3.5–5.1)
POTASSIUM: 4.6 mmol/L (ref 3.5–5.1)
Potassium: 3.7 mmol/L (ref 3.5–5.1)
Potassium: 3.7 mmol/L (ref 3.5–5.1)
Potassium: 4.2 mmol/L (ref 3.5–5.1)
Potassium: 4.9 mmol/L (ref 3.5–5.1)
Potassium: 4.3 mmol/L (ref 3.5–5.1)
Potassium: 4.6 mmol/L (ref 3.5–5.1)
Potassium: 4.2 mmol/L (ref 3.5–5.1)
SODIUM: 137 mmol/L (ref 135–145)
SODIUM: 135 mmol/L (ref 135–145)
SODIUM: 136 mmol/L (ref 135–145)
SODIUM: 136 mmol/L (ref 135–145)
Sodium: 137 mmol/L (ref 135–145)
Sodium: 136 mmol/L (ref 135–145)
Sodium: 135 mmol/L (ref 135–145)
Sodium: 135 mmol/L (ref 135–145)
Sodium: 136 mmol/L (ref 135–145)
TCO2: 29 mmol/L (ref 0–100)
TCO2: 28 mmol/L (ref 0–100)
TCO2: 29 mmol/L (ref 0–100)
TCO2: 28 mmol/L (ref 0–100)
TCO2: 28 mmol/L (ref 0–100)
TCO2: 26 mmol/L (ref 0–100)
TCO2: 27 mmol/L (ref 0–100)
TCO2: 26 mmol/L (ref 0–100)
TCO2: 25 mmol/L (ref 0–100)

## 2016-10-01 LAB — POCT I-STAT 3, ART BLOOD GAS (G3+)
ACID-BASE DEFICIT: 2 mmol/L (ref 0.0–2.0)
ACID-BASE EXCESS: 2 mmol/L (ref 0.0–2.0)
ACID-BASE EXCESS: 3 mmol/L — AB (ref 0.0–2.0)
Acid-Base Excess: 5 mmol/L — ABNORMAL HIGH (ref 0.0–2.0)
Acid-base deficit: 2 mmol/L (ref 0.0–2.0)
BICARBONATE: 29.3 mmol/L — AB (ref 20.0–28.0)
BICARBONATE: 27.3 mmol/L (ref 20.0–28.0)
Bicarbonate: 22.9 mmol/L (ref 20.0–28.0)
Bicarbonate: 23.3 mmol/L (ref 20.0–28.0)
Bicarbonate: 28.6 mmol/L — ABNORMAL HIGH (ref 20.0–28.0)
O2 SAT: 100 %
O2 SAT: 97 %
O2 Saturation: 91 %
O2 Saturation: 95 %
O2 Saturation: 99 %
PCO2 ART: 35.6 mmHg (ref 32.0–48.0)
PH ART: 7.353 (ref 7.350–7.450)
PH ART: 7.413 (ref 7.350–7.450)
PH ART: 7.454 — AB (ref 7.350–7.450)
PH ART: 7.469 — AB (ref 7.350–7.450)
TCO2: 24 mmol/L (ref 0–100)
TCO2: 25 mmol/L (ref 0–100)
TCO2: 28 mmol/L (ref 0–100)
TCO2: 30 mmol/L (ref 0–100)
TCO2: 31 mmol/L (ref 0–100)
pCO2 arterial: 41.7 mmHg (ref 32.0–48.0)
pCO2 arterial: 51.4 mmHg — ABNORMAL HIGH (ref 32.0–48.0)
pCO2 arterial: 37.4 mmHg (ref 32.0–48.0)
pCO2 arterial: 38.3 mmHg (ref 32.0–48.0)
pH, Arterial: 7.39 (ref 7.350–7.450)
pO2, Arterial: 108 mmHg (ref 83.0–108.0)
pO2, Arterial: 309 mmHg — ABNORMAL HIGH (ref 83.0–108.0)
pO2, Arterial: 57 mmHg — ABNORMAL LOW (ref 83.0–108.0)
pO2, Arterial: 81 mmHg — ABNORMAL LOW (ref 83.0–108.0)
pO2, Arterial: 92 mmHg (ref 83.0–108.0)

## 2016-10-01 LAB — FIBRINOGEN: Fibrinogen: 257 mg/dL (ref 210–475)

## 2016-10-01 LAB — POCT I-STAT 4, (NA,K, GLUC, HGB,HCT)
GLUCOSE: 109 mg/dL — AB (ref 65–99)
HEMATOCRIT: 31 % — AB (ref 39.0–52.0)
HEMOGLOBIN: 10.5 g/dL — AB (ref 13.0–17.0)
Potassium: 3.7 mmol/L (ref 3.5–5.1)
Sodium: 139 mmol/L (ref 135–145)

## 2016-10-01 LAB — CBC
HCT: 29.4 % — ABNORMAL LOW (ref 39.0–52.0)
HEMATOCRIT: 31.2 % — AB (ref 39.0–52.0)
HEMOGLOBIN: 9.7 g/dL — AB (ref 13.0–17.0)
Hemoglobin: 9.4 g/dL — ABNORMAL LOW (ref 13.0–17.0)
MCH: 24.9 pg — ABNORMAL LOW (ref 26.0–34.0)
MCH: 25.2 pg — ABNORMAL LOW (ref 26.0–34.0)
MCHC: 31.1 g/dL (ref 30.0–36.0)
MCHC: 32 g/dL (ref 30.0–36.0)
MCV: 80.2 fL (ref 78.0–100.0)
MCV: 78.8 fL (ref 78.0–100.0)
PLATELETS: 127 10*3/uL — AB (ref 150–400)
Platelets: 114 10*3/uL — ABNORMAL LOW (ref 150–400)
RBC: 3.89 MIL/uL — ABNORMAL LOW (ref 4.22–5.81)
RBC: 3.73 MIL/uL — AB (ref 4.22–5.81)
RDW: 21.5 % — ABNORMAL HIGH (ref 11.5–15.5)
RDW: 21.4 % — ABNORMAL HIGH (ref 11.5–15.5)
WBC: 9.3 10*3/uL (ref 4.0–10.5)
WBC: 10.5 10*3/uL (ref 4.0–10.5)

## 2016-10-01 LAB — GLUCOSE, CAPILLARY
GLUCOSE-CAPILLARY: 60 mg/dL — AB (ref 65–99)
GLUCOSE-CAPILLARY: 87 mg/dL (ref 65–99)
Glucose-Capillary: 97 mg/dL (ref 65–99)
Glucose-Capillary: 69 mg/dL (ref 65–99)
Glucose-Capillary: 89 mg/dL (ref 65–99)
Glucose-Capillary: 115 mg/dL — ABNORMAL HIGH (ref 65–99)

## 2016-10-01 LAB — PROTIME-INR
INR: 1.63
Prothrombin Time: 19.5 seconds — ABNORMAL HIGH (ref 11.4–15.2)

## 2016-10-01 LAB — PREPARE RBC (CROSSMATCH)

## 2016-10-01 LAB — HEMOGLOBIN AND HEMATOCRIT, BLOOD
HEMATOCRIT: 20.2 % — AB (ref 39.0–52.0)
HEMOGLOBIN: 6.1 g/dL — AB (ref 13.0–17.0)

## 2016-10-01 LAB — MAGNESIUM: MAGNESIUM: 2.5 mg/dL — AB (ref 1.7–2.4)

## 2016-10-01 LAB — APTT
APTT: 45 s — AB (ref 24–36)
aPTT: 38 seconds — ABNORMAL HIGH (ref 24–36)

## 2016-10-01 LAB — PLATELET COUNT: Platelets: 99 10*3/uL — ABNORMAL LOW (ref 150–400)

## 2016-10-01 SURGERY — REPLACEMENT, MITRAL VALVE, REPEAT
Anesthesia: General | Site: Chest

## 2016-10-01 MED ORDER — ACETAMINOPHEN 160 MG/5ML PO SOLN: 650.0000 mg | Freq: Once | ORAL | Status: AC

## 2016-10-01 MED ORDER — TRAMADOL HCL 50 MG PO TABS: 50.0000 mg | ORAL_TABLET | ORAL | Status: DC | PRN

## 2016-10-01 MED ORDER — HEMOSTATIC AGENTS (NO CHARGE) OPTIME
TOPICAL | Status: DC | PRN
  Administered 2016-10-01: 1 via TOPICAL

## 2016-10-01 MED ORDER — DEXTROSE 50 % IV SOLN
12.0000 mL | Freq: Once | INTRAVENOUS | Status: AC
  Administered 2016-10-01: 16 mL via INTRAVENOUS

## 2016-10-01 MED ORDER — SODIUM CHLORIDE 0.9 % IV SOLN: 250.0000 mL | INTRAVENOUS | Status: DC

## 2016-10-01 MED ORDER — SODIUM CHLORIDE 0.9% FLUSH
3.0000 mL | Freq: Two times a day (BID) | INTRAVENOUS | Status: DC
  Administered 2016-10-02 – 2016-10-07 (×7): 3 mL via INTRAVENOUS

## 2016-10-01 MED ORDER — MAGNESIUM SULFATE 4 GM/100ML IV SOLN
4.0000 g | Freq: Once | INTRAVENOUS | Status: AC
  Administered 2016-10-01: 4 g via INTRAVENOUS
  Filled 2016-10-01: qty 100

## 2016-10-01 MED ORDER — LACTATED RINGERS IV SOLN
INTRAVENOUS | Status: DC | PRN
  Administered 2016-10-01: 07:00:00 via INTRAVENOUS

## 2016-10-01 MED ORDER — ROCURONIUM BROMIDE 10 MG/ML (PF) SYRINGE
PREFILLED_SYRINGE | INTRAVENOUS | Status: AC
  Filled 2016-10-01: qty 20

## 2016-10-01 MED ORDER — INSULIN ASPART 100 UNIT/ML ~~LOC~~ SOLN: 0.0000 [IU] | SUBCUTANEOUS | Status: DC

## 2016-10-01 MED ORDER — SODIUM CHLORIDE 0.9 % IJ SOLN
INTRAMUSCULAR | Status: DC | PRN
  Administered 2016-10-01: 09:00:00 via TOPICAL

## 2016-10-01 MED ORDER — MORPHINE SULFATE (PF) 2 MG/ML IV SOLN
1.0000 mg | INTRAVENOUS | Status: DC | PRN
  Administered 2016-10-02 – 2016-10-03 (×2): 2 mg via INTRAVENOUS
  Filled 2016-10-01 (×2): qty 1

## 2016-10-01 MED ORDER — 0.9 % SODIUM CHLORIDE (POUR BTL) OPTIME
TOPICAL | Status: DC | PRN
  Administered 2016-10-01: 6000 mL

## 2016-10-01 MED ORDER — EPHEDRINE 5 MG/ML INJ
INTRAVENOUS | Status: AC
  Filled 2016-10-01: qty 10

## 2016-10-01 MED ORDER — SODIUM CHLORIDE 0.9 % IV SOLN
INTRAVENOUS | Status: DC
  Administered 2016-10-01: 15:00:00 via INTRAVENOUS

## 2016-10-01 MED ORDER — LACTATED RINGERS IV SOLN: INTRAVENOUS | Status: DC

## 2016-10-01 MED ORDER — ASPIRIN EC 325 MG PO TBEC: 325.0000 mg | DELAYED_RELEASE_TABLET | Freq: Every day | ORAL | Status: DC

## 2016-10-01 MED ORDER — MIDAZOLAM HCL 10 MG/2ML IJ SOLN
INTRAMUSCULAR | Status: AC
  Filled 2016-10-01: qty 2

## 2016-10-01 MED ORDER — CALCIUM CHLORIDE 10 % IV SOLN
INTRAVENOUS | Status: DC | PRN
  Administered 2016-10-01 (×5): 200 mg via INTRAVENOUS

## 2016-10-01 MED ORDER — HEPARIN SODIUM (PORCINE) 1000 UNIT/ML IJ SOLN
INTRAMUSCULAR | Status: AC
  Filled 2016-10-01: qty 1

## 2016-10-01 MED ORDER — MOMETASONE FURO-FORMOTEROL FUM 100-5 MCG/ACT IN AERO
2.0000 | INHALATION_SPRAY | Freq: Two times a day (BID) | RESPIRATORY_TRACT | Status: DC
  Administered 2016-10-02 – 2016-10-08 (×12): 2 via RESPIRATORY_TRACT
  Filled 2016-10-01 (×2): qty 8.8

## 2016-10-01 MED ORDER — LACTATED RINGERS IV SOLN
INTRAVENOUS | Status: DC
  Administered 2016-10-01: 23:00:00 via INTRAVENOUS

## 2016-10-01 MED ORDER — PHENYLEPHRINE HCL 10 MG/ML IJ SOLN
INTRAMUSCULAR | Status: DC | PRN
  Administered 2016-10-01 (×3): 80 ug via INTRAVENOUS
  Administered 2016-10-01: 40 ug via INTRAVENOUS

## 2016-10-01 MED ORDER — PROTAMINE SULFATE 10 MG/ML IV SOLN
INTRAVENOUS | Status: AC
  Filled 2016-10-01: qty 25

## 2016-10-01 MED ORDER — ONDANSETRON HCL 4 MG/2ML IJ SOLN
4.0000 mg | Freq: Four times a day (QID) | INTRAMUSCULAR | Status: DC | PRN
Start: 2016-10-01 — End: 2016-10-08

## 2016-10-01 MED ORDER — SODIUM CHLORIDE 0.9 % IV SOLN
INTRAVENOUS | Status: DC
  Filled 2016-10-01 (×2): qty 2.5

## 2016-10-01 MED ORDER — FAMOTIDINE IN NACL 20-0.9 MG/50ML-% IV SOLN
20.0000 mg | Freq: Two times a day (BID) | INTRAVENOUS | Status: DC
  Administered 2016-10-01: 20 mg via INTRAVENOUS

## 2016-10-01 MED ORDER — LIDOCAINE 2% (20 MG/ML) 5 ML SYRINGE
INTRAMUSCULAR | Status: AC
  Filled 2016-10-01: qty 5

## 2016-10-01 MED ORDER — MORPHINE SULFATE (PF) 2 MG/ML IV SOLN: 1.0000 mg | INTRAVENOUS | Status: DC | PRN

## 2016-10-01 MED ORDER — DEXTROSE 50 % IV SOLN
INTRAVENOUS | Status: AC
  Administered 2016-10-01: 16 mL via INTRAVENOUS
  Filled 2016-10-01: qty 50

## 2016-10-01 MED ORDER — ACETAMINOPHEN 500 MG PO TABS
1000.0000 mg | ORAL_TABLET | Freq: Four times a day (QID) | ORAL | Status: AC
  Administered 2016-10-02 – 2016-10-04 (×11): 1000 mg via ORAL
  Filled 2016-10-01 (×16): qty 2

## 2016-10-01 MED ORDER — LACTATED RINGERS IV SOLN: 500.0000 mL | Freq: Once | INTRAVENOUS | Status: DC | PRN

## 2016-10-01 MED ORDER — FENTANYL CITRATE (PF) 250 MCG/5ML IJ SOLN
INTRAMUSCULAR | Status: AC
  Filled 2016-10-01: qty 25

## 2016-10-01 MED ORDER — NOREPINEPHRINE BITARTRATE 1 MG/ML IV SOLN
0.0000 ug/min | INTRAVENOUS | Status: DC
  Administered 2016-10-01: 2 ug/min via INTRAVENOUS
  Filled 2016-10-01: qty 4

## 2016-10-01 MED ORDER — DEXTROSE 5 % IV SOLN
INTRAVENOUS | Status: DC | PRN
  Administered 2016-10-01: 40 ug/min via INTRAVENOUS

## 2016-10-01 MED ORDER — DEXTROSE 5 % IV SOLN
1.5000 g | Freq: Two times a day (BID) | INTRAVENOUS | Status: AC
  Administered 2016-10-01 – 2016-10-03 (×4): 1.5 g via INTRAVENOUS
  Filled 2016-10-01 (×4): qty 1.5

## 2016-10-01 MED ORDER — ALBUMIN HUMAN 5 % IV SOLN
INTRAVENOUS | Status: DC | PRN
  Administered 2016-10-01: 13:00:00 via INTRAVENOUS

## 2016-10-01 MED ORDER — FENTANYL CITRATE (PF) 250 MCG/5ML IJ SOLN
INTRAMUSCULAR | Status: DC | PRN
  Administered 2016-10-01: 25 ug via INTRAVENOUS
  Administered 2016-10-01: 525 ug via INTRAVENOUS
  Administered 2016-10-01: 200 ug via INTRAVENOUS
  Administered 2016-10-01: 50 ug via INTRAVENOUS

## 2016-10-01 MED ORDER — MILRINONE LACTATE IN DEXTROSE 20-5 MG/100ML-% IV SOLN
0.2500 ug/kg/min | INTRAVENOUS | Status: DC
  Administered 2016-10-01: .3 ug/kg/min via INTRAVENOUS
  Filled 2016-10-01: qty 100

## 2016-10-01 MED ORDER — BISACODYL 5 MG PO TBEC
10.0000 mg | DELAYED_RELEASE_TABLET | Freq: Every day | ORAL | Status: DC
  Administered 2016-10-02 – 2016-10-05 (×4): 10 mg via ORAL
  Filled 2016-10-01 (×4): qty 2

## 2016-10-01 MED ORDER — PANTOPRAZOLE SODIUM 40 MG PO TBEC: 40.0000 mg | DELAYED_RELEASE_TABLET | Freq: Every day | ORAL | Status: DC

## 2016-10-01 MED ORDER — PROPOFOL 10 MG/ML IV BOLUS
INTRAVENOUS | Status: AC
  Filled 2016-10-01: qty 20

## 2016-10-01 MED ORDER — DEXMEDETOMIDINE HCL IN NACL 200 MCG/50ML IV SOLN
0.0000 ug/kg/h | INTRAVENOUS | Status: DC
  Administered 2016-10-01: 0.7 ug/kg/h via INTRAVENOUS
  Filled 2016-10-01: qty 50

## 2016-10-01 MED ORDER — NOREPINEPHRINE BITARTRATE 1 MG/ML IV SOLN
0.0000 ug/min | INTRAVENOUS | Status: DC
  Filled 2016-10-01: qty 4

## 2016-10-01 MED ORDER — MIDAZOLAM HCL 5 MG/5ML IJ SOLN
INTRAMUSCULAR | Status: DC | PRN
  Administered 2016-10-01: 2 mg via INTRAVENOUS
  Administered 2016-10-01 (×2): 1 mg via INTRAVENOUS
  Administered 2016-10-01: 2 mg via INTRAVENOUS

## 2016-10-01 MED ORDER — VANCOMYCIN HCL IN DEXTROSE 1-5 GM/200ML-% IV SOLN
1000.0000 mg | Freq: Once | INTRAVENOUS | Status: AC
  Administered 2016-10-01: 1000 mg via INTRAVENOUS
  Filled 2016-10-01: qty 200

## 2016-10-01 MED ORDER — INSULIN REGULAR BOLUS VIA INFUSION
0.0000 [IU] | Freq: Three times a day (TID) | INTRAVENOUS | Status: DC
  Filled 2016-10-01: qty 10

## 2016-10-01 MED ORDER — ACETAMINOPHEN 650 MG RE SUPP
650.0000 mg | Freq: Once | RECTAL | Status: AC
  Administered 2016-10-01: 650 mg via RECTAL

## 2016-10-01 MED ORDER — SODIUM CHLORIDE 0.45 % IV SOLN
INTRAVENOUS | Status: DC | PRN
  Administered 2016-10-01: 15:00:00 via INTRAVENOUS

## 2016-10-01 MED ORDER — DOCUSATE SODIUM 100 MG PO CAPS
200.0000 mg | ORAL_CAPSULE | Freq: Every day | ORAL | Status: DC
  Administered 2016-10-02 – 2016-10-05 (×4): 200 mg via ORAL
  Filled 2016-10-01 (×5): qty 2

## 2016-10-01 MED ORDER — PROTAMINE SULFATE 10 MG/ML IV SOLN
INTRAVENOUS | Status: DC | PRN
  Administered 2016-10-01: 10 mg via INTRAVENOUS
  Administered 2016-10-01: 230 mg via INTRAVENOUS

## 2016-10-01 MED ORDER — ARTIFICIAL TEARS OP OINT
TOPICAL_OINTMENT | OPHTHALMIC | Status: DC | PRN
  Administered 2016-10-01: 1 via OPHTHALMIC

## 2016-10-01 MED ORDER — PHENYLEPHRINE 40 MCG/ML (10ML) SYRINGE FOR IV PUSH (FOR BLOOD PRESSURE SUPPORT)
PREFILLED_SYRINGE | INTRAVENOUS | Status: AC
  Filled 2016-10-01: qty 10

## 2016-10-01 MED ORDER — MILRINONE LACTATE IN DEXTROSE 20-5 MG/100ML-% IV SOLN
0.1250 ug/kg/min | INTRAVENOUS | Status: DC
  Filled 2016-10-01: qty 100

## 2016-10-01 MED ORDER — HEPARIN SODIUM (PORCINE) 1000 UNIT/ML IJ SOLN
INTRAMUSCULAR | Status: DC | PRN
  Administered 2016-10-01: 26000 [IU] via INTRAVENOUS

## 2016-10-01 MED ORDER — ROCURONIUM BROMIDE 10 MG/ML (PF) SYRINGE
PREFILLED_SYRINGE | INTRAVENOUS | Status: AC
  Filled 2016-10-01: qty 10

## 2016-10-01 MED ORDER — LIDOCAINE 2% (20 MG/ML) 5 ML SYRINGE
INTRAMUSCULAR | Status: DC | PRN
  Administered 2016-10-01: 100 mg via INTRAVENOUS

## 2016-10-01 MED ORDER — ROCURONIUM BROMIDE 10 MG/ML (PF) SYRINGE
PREFILLED_SYRINGE | INTRAVENOUS | Status: DC | PRN
  Administered 2016-10-01 (×5): 50 mg via INTRAVENOUS

## 2016-10-01 MED ORDER — MIDAZOLAM HCL 2 MG/2ML IJ SOLN: 2.0000 mg | INTRAMUSCULAR | Status: DC | PRN

## 2016-10-01 MED ORDER — ACETAMINOPHEN 160 MG/5ML PO SOLN: 1000.0000 mg | Freq: Four times a day (QID) | ORAL | Status: DC

## 2016-10-01 MED ORDER — BISACODYL 10 MG RE SUPP: 10.0000 mg | Freq: Every day | RECTAL | Status: DC

## 2016-10-01 MED ORDER — ASPIRIN 81 MG PO CHEW: 324.0000 mg | CHEWABLE_TABLET | Freq: Every day | ORAL | Status: DC

## 2016-10-01 MED ORDER — MILRINONE LACTATE IN DEXTROSE 20-5 MG/100ML-% IV SOLN
0.0000 ug/kg/min | INTRAVENOUS | Status: DC
  Administered 2016-10-02: 0.2 ug/kg/min via INTRAVENOUS
  Administered 2016-10-02: 0.3 ug/kg/min via INTRAVENOUS
  Filled 2016-10-01 (×2): qty 100

## 2016-10-01 MED ORDER — SODIUM CHLORIDE 0.9% FLUSH: 3.0000 mL | INTRAVENOUS | Status: DC | PRN

## 2016-10-01 MED ORDER — SODIUM CHLORIDE 0.9 % IV SOLN
INTRAVENOUS | Status: DC | PRN
  Administered 2016-10-01: 14:00:00 via INTRAVENOUS

## 2016-10-01 MED ORDER — EPHEDRINE SULFATE 50 MG/ML IJ SOLN
INTRAMUSCULAR | Status: DC | PRN
  Administered 2016-10-01 (×2): 10 mg via INTRAVENOUS
  Administered 2016-10-01: 5 mg via INTRAVENOUS
  Administered 2016-10-01: 10 mg via INTRAVENOUS

## 2016-10-01 MED ORDER — ALBUMIN HUMAN 5 % IV SOLN
250.0000 mL | INTRAVENOUS | Status: AC | PRN
  Administered 2016-10-01 – 2016-10-02 (×3): 250 mL via INTRAVENOUS
  Filled 2016-10-01: qty 250

## 2016-10-01 MED ORDER — CHLORHEXIDINE GLUCONATE 4 % EX LIQD: 30.0000 mL | CUTANEOUS | Status: DC

## 2016-10-01 MED ORDER — PANTOPRAZOLE SODIUM 40 MG PO TBEC
40.0000 mg | DELAYED_RELEASE_TABLET | Freq: Two times a day (BID) | ORAL | Status: DC
  Administered 2016-10-02 – 2016-10-07 (×12): 40 mg via ORAL
  Filled 2016-10-01 (×12): qty 1

## 2016-10-01 MED ORDER — NITROGLYCERIN IN D5W 200-5 MCG/ML-% IV SOLN: 0.0000 ug/min | INTRAVENOUS | Status: DC

## 2016-10-01 MED ORDER — SODIUM CHLORIDE 0.9 % IR SOLN
Status: DC | PRN
  Administered 2016-10-01: 3000 mL

## 2016-10-01 MED ORDER — LACTATED RINGERS IV SOLN
INTRAVENOUS | Status: DC | PRN
  Administered 2016-10-01 (×2): via INTRAVENOUS

## 2016-10-01 MED ORDER — PHENYLEPHRINE HCL 10 MG/ML IJ SOLN
0.0000 ug/min | INTRAVENOUS | Status: DC
  Filled 2016-10-01 (×3): qty 2

## 2016-10-01 MED ORDER — OXYCODONE HCL 5 MG PO TABS
5.0000 mg | ORAL_TABLET | ORAL | Status: DC | PRN
  Administered 2016-10-02 (×3): 10 mg via ORAL
  Administered 2016-10-03 – 2016-10-04 (×4): 5 mg via ORAL
  Filled 2016-10-01 (×2): qty 1
  Filled 2016-10-01 (×2): qty 2
  Filled 2016-10-01: qty 1
  Filled 2016-10-01: qty 2
  Filled 2016-10-01: qty 1

## 2016-10-01 MED ORDER — CHLORHEXIDINE GLUCONATE 0.12 % MT SOLN
15.0000 mL | OROMUCOSAL | Status: AC
  Administered 2016-10-01: 15 mL via OROMUCOSAL

## 2016-10-01 MED ORDER — POTASSIUM CHLORIDE 10 MEQ/50ML IV SOLN
10.0000 meq | INTRAVENOUS | Status: AC
  Administered 2016-10-01 (×3): 10 meq via INTRAVENOUS

## 2016-10-01 MED ORDER — METOPROLOL TARTRATE 5 MG/5ML IV SOLN: 2.5000 mg | INTRAVENOUS | Status: DC | PRN

## 2016-10-01 MED ORDER — PROPOFOL 10 MG/ML IV BOLUS
INTRAVENOUS | Status: DC | PRN
  Administered 2016-10-01: 20 mg via INTRAVENOUS

## 2016-10-01 SURGICAL SUPPLY — 139 items
ADAPTER CARDIO PERF ANTE/RETRO (ADAPTER) ×3 IMPLANT
APPLICATOR COTTON TIP 6IN STRL (MISCELLANEOUS) IMPLANT
ATTRACTOMAT 16X20 MAGNETIC DRP (DRAPES) ×3 IMPLANT
BAG DECANTER FOR FLEXI CONT (MISCELLANEOUS) ×6 IMPLANT
BLADE CORE FAN STRYKER (BLADE) ×6 IMPLANT
BLADE OSCILLATING /SAGITTAL (BLADE) ×3 IMPLANT
BLADE STERNUM SYSTEM 6 (BLADE) ×3 IMPLANT
BLADE SURG 11 STRL SS (BLADE) ×9 IMPLANT
CANISTER SUCTION 2500CC (MISCELLANEOUS) ×3 IMPLANT
CANN PRFSN 3/8X14X24FR PCFC (MISCELLANEOUS)
CANN PRFSN 3/8XCNCT ST RT ANG (MISCELLANEOUS)
CANNULA EZ GLIDE AORTIC 21FR (CANNULA) ×3 IMPLANT
CANNULA FEM VENOUS REMOTE 22FR (CANNULA) ×3 IMPLANT
CANNULA GUNDRY RCSP 15FR (MISCELLANEOUS) IMPLANT
CANNULA PRFSN 3/8X14X24FR PCFC (MISCELLANEOUS) IMPLANT
CANNULA PRFSN 3/8XCNCT RT ANG (MISCELLANEOUS) IMPLANT
CANNULA VEN MTL TIP RT (MISCELLANEOUS)
CANNULA VENNOUS METAL TIP 20FR (CANNULA) ×3 IMPLANT
CARDIOBLATE CARDIAC ABLATION (MISCELLANEOUS)
CATH ROBINSON RED A/P 18FR (CATHETERS) ×3 IMPLANT
CATH THORACIC 28FR RT ANG (CATHETERS) IMPLANT
CATH THORACIC 36FR (CATHETERS) ×3 IMPLANT
CLIP FOGARTY SPRING 6M (CLIP) IMPLANT
CONN 1/2X1/2X1/2  BEN (MISCELLANEOUS) ×2
CONN 1/2X1/2X1/2 BEN (MISCELLANEOUS) ×4 IMPLANT
CONN 3/8X1/2 ST GISH (MISCELLANEOUS) ×12 IMPLANT
CONN ST 1/4X3/8  BEN (MISCELLANEOUS) ×3
CONN ST 1/4X3/8 BEN (MISCELLANEOUS) ×6 IMPLANT
CONNECTOR 1/2X3/8X1/2 3 WAY (MISCELLANEOUS) ×1
CONNECTOR 1/2X3/8X1/2 3WAY (MISCELLANEOUS) ×2 IMPLANT
CONT SPEC 4OZ CLIKSEAL STRL BL (MISCELLANEOUS) ×3 IMPLANT
COUNTER NEEDLE 20 DBL MAG RED (NEEDLE) ×3 IMPLANT
COVER PROBE W GEL 5X96 (DRAPES) ×3 IMPLANT
COVER SURGICAL LIGHT HANDLE (MISCELLANEOUS) IMPLANT
CRADLE DONUT ADULT HEAD (MISCELLANEOUS) ×3 IMPLANT
DEVICE CARDIOBLATE CARDIAC ABL (MISCELLANEOUS) IMPLANT
DEVICE SUT CK QUICK LOAD INDV (Prosthesis & Implant Heart) ×12 IMPLANT
DEVICE SUT CK QUICK LOAD MINI (Prosthesis & Implant Heart) ×12 IMPLANT
DRAIN CHANNEL 32F RND 10.7 FF (WOUND CARE) ×3 IMPLANT
DRAPE CARDIOVASCULAR INCISE (DRAPES) ×1
DRAPE INCISE IOBAN 66X45 STRL (DRAPES) ×6 IMPLANT
DRAPE INCISE IOBAN 85X60 (DRAPES) ×3 IMPLANT
DRAPE SLUSH/WARMER DISC (DRAPES) IMPLANT
DRAPE SRG 135X102X78XABS (DRAPES) ×2 IMPLANT
DRSG COVADERM 4X14 (GAUZE/BANDAGES/DRESSINGS) ×3 IMPLANT
ELECT BLADE 4.0 EZ CLEAN MEGAD (MISCELLANEOUS) ×3
ELECT REM PT RETURN 9FT ADLT (ELECTROSURGICAL) ×6
ELECTRODE BLDE 4.0 EZ CLN MEGD (MISCELLANEOUS) ×2 IMPLANT
ELECTRODE REM PT RTRN 9FT ADLT (ELECTROSURGICAL) ×4 IMPLANT
FELT TEFLON 1X6 (MISCELLANEOUS) ×3 IMPLANT
GAUZE SPONGE 4X4 12PLY STRL (GAUZE/BANDAGES/DRESSINGS) ×3 IMPLANT
GLOVE BIO SURGEON STRL SZ 6 (GLOVE) IMPLANT
GLOVE BIO SURGEON STRL SZ 6.5 (GLOVE) IMPLANT
GLOVE BIO SURGEON STRL SZ7 (GLOVE) IMPLANT
GLOVE BIO SURGEON STRL SZ7.5 (GLOVE) IMPLANT
GLOVE BIOGEL M 6.5 STRL (GLOVE) ×9 IMPLANT
GLOVE BIOGEL M 7.0 STRL (GLOVE) ×12 IMPLANT
GLOVE BIOGEL PI IND STRL 6.5 (GLOVE) ×4 IMPLANT
GLOVE BIOGEL PI IND STRL 7.0 (GLOVE) ×6 IMPLANT
GLOVE BIOGEL PI INDICATOR 6.5 (GLOVE) ×2
GLOVE BIOGEL PI INDICATOR 7.0 (GLOVE) ×3
GLOVE ORTHO TXT STRL SZ7.5 (GLOVE) ×9 IMPLANT
GLOVE SURG SS PI 6.0 STRL IVOR (GLOVE) ×3 IMPLANT
GOWN STRL REUS W/ TWL LRG LVL3 (GOWN DISPOSABLE) ×20 IMPLANT
GOWN STRL REUS W/TWL LRG LVL3 (GOWN DISPOSABLE) ×10
HEMOSTAT POWDER SURGIFOAM 1G (HEMOSTASIS) ×9 IMPLANT
INSERT FOGARTY 61MM (MISCELLANEOUS) ×3 IMPLANT
INSERT FOGARTY XLG (MISCELLANEOUS) ×3 IMPLANT
KIT BASIN OR (CUSTOM PROCEDURE TRAY) ×3 IMPLANT
KIT DILATOR VASC 18G NDL (KITS) ×3 IMPLANT
KIT DRAINAGE VACCUM ASSIST (KITS) ×3 IMPLANT
KIT ROOM TURNOVER OR (KITS) ×3 IMPLANT
KIT SUCTION CATH 14FR (SUCTIONS) ×12 IMPLANT
KIT SUT CK MINI COMBO 4X17 (Prosthesis & Implant Heart) ×3 IMPLANT
LINE VENT (MISCELLANEOUS) ×3 IMPLANT
MARKER GRAFT CORONARY BYPASS (MISCELLANEOUS) IMPLANT
NS IRRIG 1000ML POUR BTL (IV SOLUTION) ×18 IMPLANT
PACK OPEN HEART (CUSTOM PROCEDURE TRAY) ×3 IMPLANT
PAD ARMBOARD 7.5X6 YLW CONV (MISCELLANEOUS) ×6 IMPLANT
PAD ELECT DEFIB RADIOL ZOLL (MISCELLANEOUS) ×3 IMPLANT
PUNCH AORTIC ROTATE  4.5MM 8IN (MISCELLANEOUS) ×3 IMPLANT
RING TRICUSPID T28 (Prosthesis & Implant Heart) ×3 IMPLANT
SEALANT SURG COSEAL 8ML (VASCULAR PRODUCTS) IMPLANT
SET CARDIOPLEGIA MPS 5001102 (MISCELLANEOUS) ×3 IMPLANT
SET IRRIG TUBING LAPAROSCOPIC (IRRIGATION / IRRIGATOR) ×3 IMPLANT
SPONGE LAP 18X18 X RAY DECT (DISPOSABLE) ×3 IMPLANT
SPONGE LAP 4X18 X RAY DECT (DISPOSABLE) ×3 IMPLANT
SUCKER INTRACARDIAC WEIGHTED (SUCKER) ×3 IMPLANT
SUT BONE WAX W31G (SUTURE) IMPLANT
SUT ETHIBON 2 0 V 52N 30 (SUTURE) ×3 IMPLANT
SUT ETHIBOND (SUTURE) ×3 IMPLANT
SUT ETHIBOND 2 0 SH (SUTURE) ×18 IMPLANT
SUT ETHIBOND 2 0 SH 36X2 (SUTURE) ×12 IMPLANT
SUT ETHIBOND 2 0 V4 (SUTURE) IMPLANT
SUT ETHIBOND 2 0V4 GREEN (SUTURE) IMPLANT
SUT ETHIBOND 2-0 RB-1 WHT (SUTURE) ×6 IMPLANT
SUT ETHIBOND 4 0 TF (SUTURE) IMPLANT
SUT ETHIBOND 5 0 C 1 30 (SUTURE) ×6 IMPLANT
SUT ETHIBOND X763 2 0 SH 1 (SUTURE) ×6 IMPLANT
SUT MNCRL AB 3-0 PS2 18 (SUTURE) IMPLANT
SUT PDS AB 1 CTX 36 (SUTURE) IMPLANT
SUT PROLENE 3 0 SH 1 (SUTURE) ×6 IMPLANT
SUT PROLENE 3 0 SH DA (SUTURE) ×12 IMPLANT
SUT PROLENE 3 0 SH1 36 (SUTURE) ×24 IMPLANT
SUT PROLENE 4 0 RB 1 (SUTURE) ×5
SUT PROLENE 4 0 SH DA (SUTURE) ×6 IMPLANT
SUT PROLENE 4-0 RB1 .5 CRCL 36 (SUTURE) ×10 IMPLANT
SUT PROLENE 5 0 C 1 36 (SUTURE) ×6 IMPLANT
SUT PROLENE 6 0 C 1 30 (SUTURE) ×6 IMPLANT
SUT PROLENE 8 0 BV175 6 (SUTURE) ×12 IMPLANT
SUT SILK  1 MH (SUTURE) ×7
SUT SILK 1 MH (SUTURE) ×14 IMPLANT
SUT SILK 1 TIES 10X30 (SUTURE) ×3 IMPLANT
SUT SILK 2 0 SH CR/8 (SUTURE) ×6 IMPLANT
SUT SILK 2 0 TIES 10X30 (SUTURE) ×3 IMPLANT
SUT SILK 2 0 TIES 17X18 (SUTURE) ×1
SUT SILK 2-0 18XBRD TIE BLK (SUTURE) ×2 IMPLANT
SUT SILK 3 0 SH CR/8 (SUTURE) ×6 IMPLANT
SUT SILK 4 0 TIE 10X30 (SUTURE) ×6 IMPLANT
SUT STEEL 6MS V (SUTURE) IMPLANT
SUT STEEL STERNAL CCS#1 18IN (SUTURE) ×3 IMPLANT
SUT STEEL SZ 6 DBL 3X14 BALL (SUTURE) ×6 IMPLANT
SUT TEM PAC WIRE 2 0 SH (SUTURE) ×12 IMPLANT
SUT VIC AB 2-0 CT1 27 (SUTURE) ×1
SUT VIC AB 2-0 CT1 TAPERPNT 27 (SUTURE) ×2 IMPLANT
SUT VIC AB 2-0 CTX 27 (SUTURE) ×6 IMPLANT
SUT VIC AB 3-0 X1 27 (SUTURE) ×6 IMPLANT
SYR 5ML LUER SLIP (SYRINGE) ×3 IMPLANT
SYSTEM SAHARA CHEST DRAIN ATS (WOUND CARE) ×3 IMPLANT
TAPE CLOTH SURG 4X10 WHT LF (GAUZE/BANDAGES/DRESSINGS) ×3 IMPLANT
TAPE PAPER 2X10 WHT MICROPORE (GAUZE/BANDAGES/DRESSINGS) ×3 IMPLANT
TOWEL OR 17X24 6PK STRL BLUE (TOWEL DISPOSABLE) ×3 IMPLANT
TOWEL OR 17X26 10 PK STRL BLUE (TOWEL DISPOSABLE) ×6 IMPLANT
TRAY FOLEY IC TEMP SENS 14FR (CATHETERS) IMPLANT
TRAY FOLEY IC TEMP SENS 16FR (CATHETERS) ×3 IMPLANT
TUBING INSUFFLATION 10FT LAP (TUBING) ×6 IMPLANT
UNDERPAD 30X30 (UNDERPADS AND DIAPERS) ×3 IMPLANT
VALVE MAGNA MITRAL 29MM (Prosthesis & Implant Heart) ×3 IMPLANT
WATER STERILE IRR 1000ML POUR (IV SOLUTION) ×6 IMPLANT

## 2016-10-01 NOTE — Procedures (Signed)
Extubation Procedure Note  Patient Details:   Name: Carlos Norton DOB: 02/11/1941 MRN: [id number]   Airway Documentation:  Airway 8.5 mm (Active)  Secured at (cm) 25 cm 10/01/2016  8:17 PM  Measured From Lips 10/01/2016  8:17 PM  Secured Location Right 10/01/2016  8:17 PM  Secured By Pink Tape 10/01/2016  8:17 PM  Tube Holder Repositioned Yes 10/01/2016  5:04 PM  Cuff Pressure (cm H2O) 26 cm H2O 10/01/2016  5:04 PM  Site Condition Dry 10/01/2016  8:00 PM    Evaluation  O2 sats: stable throughout Complications: No apparent complications Patient did tolerate procedure well. Bilateral Breath Sounds: Clear, Diminished   Yes   Pt extubated to [name]. Pt able to vocalize post extubation. No complications throughout procedure.  [name] 10/01/2016, 10:35 PM

## 2016-10-01 NOTE — Brief Op Note (Addendum)
10/01/2016  2:23 PM  PATIENT:  Carlos CohenEdward L Boardman  75 y.o. male  PRE-OPERATIVE DIAGNOSIS:  MS MR  POST-OPERATIVE DIAGNOSIS:  mitral stenosis, mitral reurgitation and moderate tricuspid regurgitation  PROCEDURE:  Procedure(s): REDO MITRAL VALVE REPLACEMENT (MVR) implanted with Magna Mitral Ease size 29mm (N/A) TRICUSPID VALVE REPAIR with Edwards MC 3 size 28 (N/A) TRANSESOPHAGEAL ECHOCARDIOGRAM (TEE) (N/A)  SURGEON:    [name], MD  ASSISTANTS:  Jari Favreessa Conte, PA-C  ANESTHESIA:   Arta BruceKevin Ossey, MD  CROSSCLAMP TIME:   107'  CARDIOPULMONARY BYPASS TIME: 190'  FINDINGS:  Rheumatic heart disease with failed previous mitral valve repair with severe mitral stenosis and moderate mitral regurgitation  Perforation of posterior leaflet patch  Severe thickening, calcification and fibrosis of both anterior and posterior leaflets of native mitral valve  Normal LV systolic function  Moderate-severe pulmonary hypertension  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

## 2016-10-01 NOTE — Transfer of Care (Signed)
Immediate Anesthesia Transfer of Care Note  Patient: Carlos Norton  Procedure(s) Performed: Procedure(s): REDO MITRAL VALVE REPLACEMENT (MVR) implanted with Magna Mitral Ease size 29mm (N/A) TRICUSPID VALVE REPAIR with Edwards MC 3 size 28 (N/A) TRANSESOPHAGEAL ECHOCARDIOGRAM (TEE) (N/A)  Patient Location: PACU  Anesthesia Type:General  Level of Consciousness: Patient remains intubated per anesthesia plan  Airway & Oxygen Therapy: Patient remains intubated per anesthesia plan and Patient placed on Ventilator (see vital sign flow sheet for setting)  Post-op Assessment: Report given to RN and Post -op Vital signs reviewed and stable  Post vital signs: Reviewed and stable  Last Vitals:  Vitals:   10/01/16 0602 10/01/16 0615  BP: 123/67 123/67  Pulse: 64 69  Resp:  20  Temp:  36.6 C    Last Pain:  Vitals:   10/01/16 0615  TempSrc: Oral      Patients Stated Pain Goal: 2 (10/01/16 0556)  Complications: No apparent anesthesia complications

## 2016-10-01 NOTE — Interval H&P Note (Signed)
History and Physical Interval Note:  10/01/2016 6:30 AM  Carlos Norton  has presented today for surgery, with the diagnosis of MS MR  The various methods of treatment have been discussed with the patient and family. After consideration of risks, benefits and other options for treatment, the patient has consented to  Procedure(s): REDO MITRAL VALVE REPLACEMENT (MVR) (N/A) POSSIBLE TRICUSPID VALVE REPAIR (N/A) TRANSESOPHAGEAL ECHOCARDIOGRAM (TEE) (N/A) as a surgical intervention .  The patient's history has been reviewed, patient examined, no change in status, stable for surgery.  I have reviewed the patient's chart and labs.  Questions were answered to the patient's satisfaction.     [name]

## 2016-10-01 NOTE — Progress Notes (Signed)
RT attempted cardiac wean SIMV RR 4, 40%. Pt failed due to decreased pt effort - RR 5. Pt placed back on full support. RN notified.

## 2016-10-01 NOTE — Anesthesia Procedure Notes (Signed)
Procedure Name: Intubation Date/Time: 10/01/2016 8:03 AM Performed by: Faustino CongressWHITE, Ralene Gasparyan TENA Zilpha Mcandrew Pre-anesthesia Checklist: Patient identified, Emergency Drugs available, Suction available and Patient being monitored Patient Re-evaluated:Patient Re-evaluated prior to inductionOxygen Delivery Method: Circle System Utilized Preoxygenation: Pre-oxygenation with 100% oxygen Intubation Type: IV induction Ventilation: Mask ventilation without difficulty Laryngoscope Size: Miller and 2 Grade View: Grade I Tube type: Oral Tube size: 8.5 mm Number of attempts: 1 Airway Equipment and Method: Stylet Placement Confirmation: ETT inserted through vocal cords under direct vision,  positive ETCO2 and breath sounds checked- equal and bilateral Secured at: 23 cm Tube secured with: Tape Dental Injury: Teeth and Oropharynx as per pre-operative assessment

## 2016-10-01 NOTE — Progress Notes (Signed)
CT surgery p.m. Rounds  Sedated on ventilator status post redo MVR Hemodynamic stable on minimal inotropic support Chest tube output satisfactory Starting to wake up but not ready to wean ventilator as yet

## 2016-10-01 NOTE — Anesthesia Procedure Notes (Signed)
Central Venous Catheter Insertion Performed by: anesthesiologist Patient location: Pre-op. Preanesthetic checklist: patient identified, IV checked, site marked, risks and benefits discussed, surgical consent, monitors and equipment checked, pre-op evaluation, timeout performed and anesthesia consent Position: supine Lidocaine 1% used for infiltration Landmarks identified Catheter size: 9 Fr MAC introducer Procedure performed using ultrasound guided technique. Attempts: 1 Following insertion, line sutured and dressing applied. Post procedure assessment: blood return through all ports, free fluid flow and no air. Patient tolerated the procedure well with no immediate complications.

## 2016-10-01 NOTE — Anesthesia Postprocedure Evaluation (Signed)
Anesthesia Post Note  Patient: Carlos Norton  Procedure(s) Performed: Procedure(s) (LRB): REDO MITRAL VALVE REPLACEMENT (MVR) implanted with Magna Mitral Ease size 29mm (N/A) TRICUSPID VALVE REPAIR with Edwards MC 3 size 28 (N/A) TRANSESOPHAGEAL ECHOCARDIOGRAM (TEE) (N/A)  Patient location during evaluation: SICU Anesthesia Type: General Level of consciousness: sedated Pain management: pain level controlled Vital Signs Assessment: post-procedure vital signs reviewed and stable Respiratory status: patient remains intubated per anesthesia plan Cardiovascular status: stable Anesthetic complications: no    Last Vitals:  Vitals:   10/01/16 1545 10/01/16 1600  BP: 95/67 99/66  Pulse: 80 79  Resp: 18 18  Temp: 36.7 C 36.6 C    Last Pain:  Vitals:   10/01/16 1500  TempSrc: Core (Comment)                 Anglea Gordner DAVID

## 2016-10-01 NOTE — Op Note (Signed)
CARDIOTHORACIC SURGERY OPERATIVE NOTE  Date of Procedure:  10/01/2016  Preoperative Diagnosis:   Rheumatic Heart Disease s/p Mitral Valve Repair  Severe Mitral Stenosis  Moderate Mitral Regurgitation  Pulmonary Hypertension  Moderate Tricuspid Regurgitation  Postoperative Diagnosis: Same  Procedure:   Redo Mitral Valve Replacement   Edwards Magna Mitral Bovine Bioprosthetic Tissue Valve (size 29mm, model #7300TFX, serial #[id number])   Tricuspid Valve Repair  Edwards mc3 Ring Annuloplasty (size 28mm, model #4900, serial #[id number])   Surgeon: Salvatore Decent. Cornelius Moras, MD  Assistant: Jari Favre, PA-C  Anesthesia: Arta Bruce, MD  Operative Findings:  Rheumatic heart disease with failed previous mitral valve repair with severe mitral stenosis and moderate mitral regurgitation due to small perforation of posterior leaflet patch  Severe thickening, calcification and fibrosis of both anterior and posterior leaflets of native mitral valve  Normal LV systolic function  Moderate-severe pulmonary hypertension  Dilated tricuspid annulus with type I tricuspid valve dysfunction and moderate tricuspid regurgitation  No residual tricuspid regurgitation after ring annuloplasty             BRIEF CLINICAL NOTE AND INDICATIONS FOR SURGERY  Patient is a 75 year old male with long-standing history of rheumatic heartdisease,coronary artery disease status post PCI and stenting in the past, atrial fibrillation, and COPD who underwent minimally invasive mitral valve repair and Maze procedure on 06/18/2010 for severe mitral regurgitation and recurrent persistent atrial fibrillation. The patient did well postoperatively and was last seen here in our office on 08/15/2012. He has never had any documented recurrence of atrial fibrillation and he was taken off of Coumadin anticoagulation because of history of intolerance due to GI bleeding while he was taking Coumadin and Plavix prior to his  surgery. 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 He was hospitalized at [facility] where he underwent surgical embolectomy on 04/09/2016. He states that initially he was thought to be having a stroke but this was ruled out and ultimately the thrombus in the left brachial artery was identified. Transthoracic echocardiogram performed at that time revealed left ventricular hypertrophy with normal left ventricular systolic function, ejection fraction estimated 55-60%. There was mitral annular calcification with calcification of the mitral valve leaflets and "moderate to severe functional stenosis". The mean transvalvular gradient across the mitral valve was estimated 14 mmHg with mitral valve area calculated 0.85 cm. A definite source of the embolus was not identified and to the patient's recollection he did not undergo TEE. He was started on Eliquis at that time. After his surgical procedure he developed severe problems with bladder outlet obstruction for which he ultimately underwent transurethral laser prostatectomy in early June. Ever since the patient has had problems with progressive symptoms ofexertional shortness of breath. He was seen in follow-up by Dr. Rhona Leavens and underwent left and right heart catheterization on 07/10/2016. He was found to have mild nonobstructive coronary artery disease. Previously placed stents in the right coronary artery and the left circumflex coronary artery were widely patent. There was severe pulmonary hypertension with PA pressures measured 72/26 and mean pulmonary capillary wedge pressure 26 mmHg. Mean transvalvular gradient across the mitral valve was estimated 20 mmHg. The patient was referred for surgical consultation.  Patient was originally seen in consultation on 07/16/2016. We had previously made plans for redo mitral valve replacement last month  but when the patient presented for follow-up on 08/31/2016 he was found to be  in acute decompensated congestive heart failure with rapid atrial flutter and severe anemia. He was hospitalized and started on amiodarone. Anticoagulation was held and he underwent EGD revealing erosive gastritis and colonoscopy revealing 5 bleeding colonic AVMs. The AVMs were treated with APC andclipping. Anticoagulation was restarted after hemoglobin stabilized and he ultimately underwent TEE guided cardioversion back into sinus rhythm. TEE confirmed the presence of severe mitral stenosis with mean transvalvular gradient measured 14 mmHg.  The patient has been seen in consultation and counseled at length regarding the indications, risks and potential benefits of surgery.  All questions have been answered, and the patient provides full informed consent for the operation as described.    DETAILS OF THE OPERATIVE PROCEDURE  Preparation:  The patient is brought to the operating room on the above mentioned date and central monitoring was established by the anesthesia team including placement of Swan-Ganz catheter and radial arterial line. There was moderate to severe pulmonary hypertension at baseline. The patient is placed in the supine position on the operating table.  Intravenous antibiotics are administered. General endotracheal anesthesia is induced uneventfully. A Foley catheter is placed.  Baseline transesophageal echocardiogram was performed.  Findings were notable for normal left ventricular systolic function with at least moderate mitral stenosis. The patient had previous mitral valve repair with elevated transvalvular gradients. There was moderate mitral regurgitation. The jet of regurgitation was eccentric and directed posteriorly. It appeared to originate through the posterior leaflet itself, suggesting the presence of a small perforation. There was severe calcification and thickening of the subvalvular apparatus.  There was thickening with restricted leaflet mobility of the anterior leaflet. The posterior leaflet patch billowed considerably. Aortic valve appeared normal. There was mild right ventricular chamber enlargement. There was probably moderate tricuspid regurgitation. The tricuspid valve was not easily visualized. The tricuspid annulus was dilated greater than 4 cm in diameter.  The patient's chest, abdomen, both groins, and both lower extremities are prepared and draped in a sterile manner. A time out procedure is performed.   Surgical Approach:  A median sternotomy incision was performed.  The sternum was divided with a sagittal saw. Sternal entry was uneventful. The pericardium was opened. The ascending aorta was normal in appearance. There were moderate adhesions from the patient's previous cardiac surgery. There was severe adhesions surrounding the right atrium. Sharp dissection and electrocautery are utilized to free up the anterior surface of the ascending aorta.   Extracorporeal Cardiopulmonary Bypass and Myocardial Protection:  The right common femoral vein is cannulated using the Seldinger technique and a guidewire advanced into the right atrium using TEE guidance.  The patient is heparinized systemically and the femoral vein cannulated using a 22 Fr long femoral venous cannula.  The ascending aorta is cannulated for cardiopulmonary bypass.  Adequate heparinization is verified.     The entire pre-bypass portion of the operation was notable for stable hemodynamics.   Cardiopulmonary bypass was begun. Dissection is continued to free up the surface of the right atrium and the superior vena cava. Dissection is continued inferiorly.  Dissection of the lateral wall of the right atrium and the intra-atrial groove is somewhat tedious because of previous adhesions. The reflection the pericardium was identified and care is taken to avoid injury to the phrenic nerve.   A retrograde cardioplegia  cannula is placed through the right atrium into the coronary sinus.  A second venous cannula is placed directly into the superior vena cava.   A cardioplegia cannula is placed in the ascending aorta.  A temperature probe was placed in the interventricular septum.  Umbilical tapes are placed around the superior vena cava and the inferior vena cava.  The patient is cooled to 32C systemic temperature.  The aortic cross clamp is applied and cold blood cardioplegia is delivered initially in an antegrade fashion through the aortic root.   Supplemental cardioplegia is given retrograde through the coronary sinus catheter.  Iced saline slush is applied for topical hypothermia.  The initial cardioplegic arrest is rapid with early diastolic arrest.  Repeat doses of cardioplegia are administered intermittently throughout the entire cross clamp portion of the operation through the aortic root and through the coronary sinus catheter in order to maintain completely flat electrocardiogram and septal myocardial temperature below 15C.  Myocardial protection was felt to be excellent.   Mitral Valve Replacement:  A left atriotomy incision was performed through the interatrial groove and extended partially across the back wall of the left atrium after opening the oblique sinus inferiorly.  The floor of the left atrium and the mitral valve were exposed using a self-retaining retractor.  Exposure of the mitral valve is excellent.  The mitral valve was inspected and notable for intact ring annuloplasty from previous valve repair. There is a small hole in the core matrix patch in the posterior leaflet. There is severe thickening with restricted leaflet mobility involving the anterior leaflet. The posterior leaflet patch moves excessively.  All of the annuloplasty sutures are removed and the annuloplasty removed using an 11 blade knife. The anterior leaflet of the mitral valve was excised sharply, leaving a small rim of the free  margin and and a few associated primary chords.  The majority of the subvalvular apparatus is fibrotic and foreshortened and therefore removed. The posterior leaflet was removed and could not be preserved due to the previous patch repair.  The mitral annulus was sized to accept a 29 mm prosthesis.  The left ventricle was irrigated with copious cold saline solution.  Mitral valve replacement was performed using interrupted horizontal mattress 2-0 Ethibond pledgeted sutures with pledgets in the supraannular position.  The remaining portions of the anterior leaflet were incorporated into the suture line laterally, thereby preserving chords to both the anterior and posterior leaflet.  An [facility] Magna Mitral bovine bioprosthetic tissue valve (size 29 mm, model # 7300TFX, serial # [id number]) was implanted uneventfully. The valve seated appropriately with care to position the commissure posts away from the left ventricular outflow tract.    The atriotomy was closed using a 2-layer closure of running 3-0 Prolene suture after placing a sump drain across the mitral valve to serve as a left ventricular vent.  Rewarming is begun.     Tricuspid Valve Repair:  The inferior vena cava cannula was pulled down until the tip was just below the junction between the right atrium and the inferior vena cava. An oblique incision is made in the right atrium. Traction sutures are placed to facilitate exposure of the tricuspid valve. The tricuspid valve is inspected carefully. The tricuspid valve leaflets appear normal with normal mobility and no sign of any fibrosis or thickening. Ring annuloplasty is performed using interrupted 2-0 Ethibond horizontal mattress sutures placed circumferentially around the tricuspid annulus with exception of the area immediately below the triangle of Koch. One final dose of warm retrograde "hot shot" cardioplegia was administered retrograde through the coronary sinus catheter while all air was  evacuated through the aortic root.  The aortic cross clamp was removed after a total cross clamp  time of 107 minutes.    The valve was sized to accept a 28mm annuloplasty ring based upon the overall surface area of the combined anterior and posterior leaflets. An Edwards [facility] 3 annuloplasty ring (size 28mm, model #4900, serial [id number]) is implanted uneventfully. After completion of the annuloplasty the valve was tested with saline and appears to be competent. The right atriotomy incision is closed using a 2 layer closure of running 3-0 Prolene suture.   Procedure Completion:  Epicardial pacing wires are fixed to the right ventricular outflow tract and to the right atrial appendage. The patient is rewarmed to 37C temperature. The aortic and left ventricular vents are removed.  The patient is weaned and disconnected from cardiopulmonary bypass.  The patient's rhythm at separation from bypass was AV paced.  The patient was weaned from cardiopulmonary bypass on low dose milrinone infusion. Total cardiopulmonary bypass time for the operation was 190 minutes.  Followup transesophageal echocardiogram performed after separation from bypass revealed a well-seated mitral valve prosthesis that was functioning normally and without any sign of perivalvular leak.  There was trace central mitral regurgitation.  Left ventricular function was unchanged from preoperatively.  The tricuspid valve was not well visualized but there did not appear to be any residual tricuspid regurgitation.  The aortic and superior vena cava cannula were removed uneventfully. Protamine was administered to reverse the anticoagulation. The femoral venous cannula was removed and manual pressure held on the groin for 30 minutes.  The mediastinum and pleural space were inspected for hemostasis and irrigated with saline solution. The mediastinum and both pleural spaces were drained using 4 chest tubes placed through separate stab incisions  inferiorly.  The soft tissues anterior to the aorta were reapproximated loosely. The sternum is closed with double strength sternal wire. The soft tissues anterior to the sternum were closed in multiple layers and the skin is closed with a running subcuticular skin closure.  The post-bypass portion of the operation was notable for stable rhythm and hemodynamics.  The patient received 2 units packed red blood cells during the procedure due to anemia which was present preoperatively and exacerbated by acute blood loss and hemodilution during cardiopulmonary bypass.  The patient received a total of 2 packs adult platelets and 2 units fresh frozen plasma due to coagulopathy and thrombocytopenia after separation from cardiopulmonary bypass and reversal of heparin with protamine.    Patient Disposition:  The patient tolerated the procedure well and is transported to the surgical intensive care in stable condition. There are no intraoperative complications. All sponge instrument and needle counts are verified correct at completion of the operation.     Salvatore Decent. Cornelius Moras MD 10/01/2016 2:28 PM

## 2016-10-01 NOTE — Anesthesia Procedure Notes (Signed)
Central Venous Catheter Insertion Performed by: anesthesiologist Patient location: Pre-op. Preanesthetic checklist: patient identified, IV checked, site marked, risks and benefits discussed, surgical consent, monitors and equipment checked, pre-op evaluation, timeout performed and anesthesia consent PA cath was placed.Swan type and PA catheter depth:thermodilationProcedure performed without using ultrasound guided technique. Attempts: 1 Patient tolerated the procedure well with no immediate complications.       

## 2016-10-01 NOTE — Anesthesia Preprocedure Evaluation (Signed)
Anesthesia Evaluation  Patient identified by MRN, date of birth, ID band Patient awake    Reviewed: Allergy & Precautions, NPO status , Patient's Chart, lab work & pertinent test results  Airway Mallampati: I  TM Distance: >3 FB Neck ROM: Full    Dental   Pulmonary former smoker,    Pulmonary exam normal        Cardiovascular + CAD  Normal cardiovascular exam+ Valvular Problems/Murmurs MR      Neuro/Psych    GI/Hepatic GERD  Medicated and Controlled,  Endo/Other    Renal/GU      Musculoskeletal   Abdominal   Peds  Hematology   Anesthesia Other Findings   Reproductive/Obstetrics                             Anesthesia Physical Anesthesia Plan  ASA: III  Anesthesia Plan: General   Post-op Pain Management:    Induction: Intravenous  Airway Management Planned: Oral ETT  Additional Equipment: Arterial line, CVP, PA Cath and TEE  Intra-op Plan:   Post-operative Plan: Post-operative intubation/ventilation  Informed Consent: I have reviewed the patients History and Physical, chart, labs and discussed the procedure including the risks, benefits and alternatives for the proposed anesthesia with the patient or authorized representative who has indicated his/her understanding and acceptance.     Plan Discussed with: CRNA and Surgeon  Anesthesia Plan Comments:         Anesthesia Quick Evaluation

## 2016-10-02 ENCOUNTER — Encounter (HOSPITAL_COMMUNITY): Payer: Self-pay | Admitting: Thoracic Surgery (Cardiothoracic Vascular Surgery)

## 2016-10-02 ENCOUNTER — Inpatient Hospital Stay (HOSPITAL_COMMUNITY): Payer: Medicare Other

## 2016-10-02 LAB — GLUCOSE, CAPILLARY
GLUCOSE-CAPILLARY: 119 mg/dL — AB (ref 65–99)
GLUCOSE-CAPILLARY: 109 mg/dL — AB (ref 65–99)
Glucose-Capillary: 100 mg/dL — ABNORMAL HIGH (ref 65–99)
Glucose-Capillary: 108 mg/dL — ABNORMAL HIGH (ref 65–99)
Glucose-Capillary: 120 mg/dL — ABNORMAL HIGH (ref 65–99)
Glucose-Capillary: 97 mg/dL (ref 65–99)

## 2016-10-02 LAB — PREPARE FRESH FROZEN PLASMA
Unit division: 0
Unit division: 0

## 2016-10-02 LAB — PREPARE PLATELET PHERESIS
UNIT DIVISION: 0
UNIT DIVISION: 0

## 2016-10-02 LAB — POCT I-STAT, CHEM 8
BUN: 17 mg/dL (ref 6–20)
CALCIUM ION: 1.21 mmol/L (ref 1.15–1.40)
Chloride: 99 mmol/L — ABNORMAL LOW (ref 101–111)
Creatinine, Ser: 1 mg/dL (ref 0.61–1.24)
Glucose, Bld: 135 mg/dL — ABNORMAL HIGH (ref 65–99)
HCT: 26 % — ABNORMAL LOW (ref 39.0–52.0)
HEMOGLOBIN: 8.8 g/dL — AB (ref 13.0–17.0)
Potassium: 4.2 mmol/L (ref 3.5–5.1)
SODIUM: 133 mmol/L — AB (ref 135–145)
TCO2: 22 mmol/L (ref 0–100)

## 2016-10-02 LAB — CBC
HEMATOCRIT: 27.2 % — AB (ref 39.0–52.0)
HEMATOCRIT: 26.8 % — AB (ref 39.0–52.0)
HEMOGLOBIN: 8.6 g/dL — AB (ref 13.0–17.0)
HEMOGLOBIN: 8.5 g/dL — AB (ref 13.0–17.0)
MCH: 25 pg — ABNORMAL LOW (ref 26.0–34.0)
MCH: 25.2 pg — ABNORMAL LOW (ref 26.0–34.0)
MCHC: 31.6 g/dL (ref 30.0–36.0)
MCHC: 31.7 g/dL (ref 30.0–36.0)
MCV: 79.1 fL (ref 78.0–100.0)
MCV: 79.5 fL (ref 78.0–100.0)
Platelets: 99 10*3/uL — ABNORMAL LOW (ref 150–400)
Platelets: 104 10*3/uL — ABNORMAL LOW (ref 150–400)
RBC: 3.44 MIL/uL — AB (ref 4.22–5.81)
RBC: 3.37 MIL/uL — ABNORMAL LOW (ref 4.22–5.81)
RDW: 22 % — AB (ref 11.5–15.5)
RDW: 22.3 % — ABNORMAL HIGH (ref 11.5–15.5)
WBC: 7.8 10*3/uL (ref 4.0–10.5)
WBC: 8.5 10*3/uL (ref 4.0–10.5)

## 2016-10-02 LAB — BASIC METABOLIC PANEL
ANION GAP: 5 (ref 5–15)
BUN: 14 mg/dL (ref 6–20)
CALCIUM: 7.9 mg/dL — AB (ref 8.9–10.3)
CO2: 23 mmol/L (ref 22–32)
Chloride: 105 mmol/L (ref 101–111)
Creatinine, Ser: 0.97 mg/dL (ref 0.61–1.24)
GFR calc non Af Amer: >60 mL/min (ref >60)
GLUCOSE: 111 mg/dL — AB (ref 65–99)
POTASSIUM: 4.1 mmol/L (ref 3.5–5.1)
Sodium: 133 mmol/L — ABNORMAL LOW (ref 135–145)

## 2016-10-02 LAB — COOXEMETRY PANEL
CARBOXYHEMOGLOBIN: 2.3 % — AB (ref 0.5–1.5)
METHEMOGLOBIN: 1.1 % (ref 0.0–1.5)
O2 SAT: 64.9 %
Total hemoglobin: 8.4 g/dL — ABNORMAL LOW (ref 12.0–16.0)

## 2016-10-02 LAB — MAGNESIUM
MAGNESIUM: 2.1 mg/dL (ref 1.7–2.4)
Magnesium: 2.4 mg/dL (ref 1.7–2.4)

## 2016-10-02 LAB — CREATININE, SERUM: CREATININE: 1.09 mg/dL (ref 0.61–1.24)

## 2016-10-02 MED ORDER — WARFARIN - PHYSICIAN DOSING INPATIENT
Freq: Every day | Status: DC
  Administered 2016-10-04 – 2016-10-07 (×2)

## 2016-10-02 MED ORDER — ATORVASTATIN CALCIUM 20 MG PO TABS
20.0000 mg | ORAL_TABLET | Freq: Every day | ORAL | Status: DC
  Administered 2016-10-03 – 2016-10-07 (×5): 20 mg via ORAL
  Filled 2016-10-02: qty 1
  Filled 2016-10-02: qty 2
  Filled 2016-10-02 (×3): qty 1

## 2016-10-02 MED ORDER — BOOST / RESOURCE BREEZE PO LIQD
1.0000 | Freq: Three times a day (TID) | ORAL | Status: DC
  Administered 2016-10-02 – 2016-10-06 (×6): 1 via ORAL

## 2016-10-02 MED ORDER — WARFARIN SODIUM 1 MG PO TABS
1.0000 mg | ORAL_TABLET | Freq: Every day | ORAL | Status: DC
  Administered 2016-10-02 – 2016-10-03 (×2): 1 mg via ORAL
  Filled 2016-10-02 (×3): qty 1

## 2016-10-02 MED ORDER — AMIODARONE HCL 200 MG PO TABS
200.0000 mg | ORAL_TABLET | Freq: Every day | ORAL | Status: DC
  Administered 2016-10-03: 200 mg via ORAL
  Filled 2016-10-02: qty 1

## 2016-10-02 MED ORDER — ORAL CARE MOUTH RINSE
15.0000 mL | Freq: Two times a day (BID) | OROMUCOSAL | Status: DC
  Administered 2016-10-02 – 2016-10-07 (×9): 15 mL via OROMUCOSAL

## 2016-10-02 MED ORDER — ASPIRIN EC 81 MG PO TBEC
81.0000 mg | DELAYED_RELEASE_TABLET | Freq: Every day | ORAL | Status: DC
  Administered 2016-10-02 – 2016-10-07 (×6): 81 mg via ORAL
  Filled 2016-10-02 (×6): qty 1

## 2016-10-02 MED ORDER — LACTATED RINGERS IV SOLN
INTRAVENOUS | Status: DC
  Administered 2016-10-04: 20 mL/h via INTRAVENOUS

## 2016-10-02 MED ORDER — TIOTROPIUM BROMIDE MONOHYDRATE 18 MCG IN CAPS
18.0000 ug | ORAL_CAPSULE | Freq: Every day | RESPIRATORY_TRACT | Status: DC
  Administered 2016-10-02 – 2016-10-07 (×5): 18 ug via RESPIRATORY_TRACT
  Filled 2016-10-02 (×2): qty 5

## 2016-10-02 MED FILL — Electrolyte-R (PH 7.4) Solution: INTRAVENOUS | Qty: 4000 | Status: AC

## 2016-10-02 MED FILL — Lidocaine HCl IV Inj 20 MG/ML: INTRAVENOUS | Qty: 5 | Status: AC

## 2016-10-02 MED FILL — Mannitol IV Soln 20%: INTRAVENOUS | Qty: 500 | Status: AC

## 2016-10-02 MED FILL — Sodium Bicarbonate IV Soln 8.4%: INTRAVENOUS | Qty: 50 | Status: AC

## 2016-10-02 MED FILL — Heparin Sodium (Porcine) Inj 1000 Unit/ML: INTRAMUSCULAR | Qty: 10 | Status: AC

## 2016-10-02 MED FILL — Sodium Chloride IV Soln 0.9%: INTRAVENOUS | Qty: 3000 | Status: AC

## 2016-10-02 NOTE — Progress Notes (Signed)
[address]       [name] [id number]             [phone]        CARDIOTHORACIC SURGERY PROGRESS NOTE   R1 Day Post-Op Procedure(s) (LRB): REDO MITRAL VALVE REPLACEMENT (MVR) implanted with Magna Mitral Ease size 29mm (N/A) TRICUSPID VALVE REPAIR with Edwards MC 3 size 28 (N/A) TRANSESOPHAGEAL ECHOCARDIOGRAM (TEE) (N/A)  Subjective: Looks good and feels well.  Mild soreness in chest.  Reports breathing already feels better than preop  Objective: Vital signs: BP Readings from Last 1 Encounters:  10/02/16 (!) 85/59   Pulse Readings from Last 1 Encounters:  10/02/16 80   Resp Readings from Last 1 Encounters:  10/02/16 20   Temp Readings from Last 1 Encounters:  10/02/16 97.5 F (36.4 C)    Hemodynamics: PAP: (22-41)/(12-24) 22/15 CO:  [3.6 L/min-5 L/min] 5 L/min CI:  [1.9 L/min/m2-2.7 L/min/m2] 2.7 L/min/m2  Physical Exam:  Rhythm:   Junctional - AAI pacing  Breath sounds: clear  Heart sounds:  RRR  Incisions:  Dressings dry, intact  Abdomen:  Soft, non-distended, non-tender  Extremities:  Warm, well-perfused  Chest tubes:  decreasing volume thin serosanguinous output, no air leak    Intake/Output from previous day: 11/02 0701 - 11/03 0700 In: 9531 [I.V.:4843; IONGE:9528Blood:3078; NG/GT:60; IV Piggyback:1550] Out: 3365 [Urine:820; Blood:1795; Chest Tube:750] Intake/Output this shift: No intake/output data recorded.  Lab Results:  CBC: Recent Labs  10/01/16 2145 10/01/16 2148 10/02/16 0426  WBC 10.5  --  7.8  HGB 9.4* 9.9* 8.6*  HCT 29.4* 29.0* 27.2*  PLT 127*  --  99*    BMET:  Recent Labs  09/29/16 1256  10/01/16 2148 10/02/16 0426  NA 137  < > 136 133*  K 3.7  < > 4.2 4.1  CL 100*  < > 100* 105  CO2 28  --   --  23  GLUCOSE 86  < > 137* 111*  BUN 16  < > 15 14  CREATININE 1.37*  < > 1.00 0.97  CALCIUM 9.6  --   --  7.9*  < > = values in this interval not displayed.   PT/INR:   Recent Labs  10/01/16 1500    LABPROT 19.5*  INR 1.63    CBG (last 3)   Recent Labs  10/01/16 2020 10/02/16 0036 10/02/16 0414  GLUCAP 115* 108* 120*    ABG    Component Value Date/Time   PHART 7.413 10/01/2016 2349   PCO2ART 35.6 10/01/2016 2349   PO2ART 57.0 (L) 10/01/2016 2349   HCO3 22.9 10/01/2016 2349   TCO2 24 10/01/2016 2349   ACIDBASEDEF 2.0 10/01/2016 2349   O2SAT 91.0 10/01/2016 2349    CXR: PORTABLE CHEST 1 VIEW  COMPARISON:  10/01/2016  FINDINGS: Evidence for bilateral chest tubes and a mediastinal drain. There is subtle lucency at both lung apices which could represent tiny pneumothoraces. In addition, there is new subcutaneous gas in the right chest. Again noted are bilateral parenchymal densities which have slightly progressed in the lower lungs. Evidence for consolidation at the right lung base. Changes compatible with mitral valve surgery. Heart size is upper limits of normal but grossly stable. Swan-Ganz catheter tip in the right pulmonary artery and appears stable. Nasogastric tube has been removed.  IMPRESSION: Increased parenchymal densities, particularly in the lower lungs. Findings could represent worsening pulmonary edema. Suspect consolidation and volume loss at the right lung base.  Bilateral  chest tubes and suspect tiny apical pneumothoraces. New subcutaneous gas in the right chest.   Electronically Signed   By: Richarda OverlieAdam  Henn M.D.   On: 10/02/2016 07:51  Assessment/Plan: S/P Procedure(s) (LRB): REDO MITRAL VALVE REPLACEMENT (MVR) implanted with Magna Mitral Ease size 29mm (N/A) TRICUSPID VALVE REPAIR with Edwards MC 3 size 28 (N/A) TRANSESOPHAGEAL ECHOCARDIOGRAM (TEE) (N/A)  Overall doing very well POD1 Maintaining AAI paced rhythm w/ stable hemodynamics on low dose milrinone and Neo drips, PA pressures much lower than preop Breathing comfortably w/ O2 sats 99-100% on 6 L/min via [place] Chronic diastolic CHF with expected post-op volume excess, weight 14  lbs > baseline Rheumatic heart disease w/ moderate-severe pulmonary hypertension preop Post op thrombocytopenia, platelet count 99k this morning Expected post op atelectasis, mild Preop anemia w/ expected post op acute blood loss anemia, Hgb 8.6 this morning Recent h/o GI bleed due to colonic AVM's S/P maze procedure at the time of mitral valve repair in 2011 History of embolization to left upper extremity in May 2017 while off anticoagulation COPD Possible small right pneumothorax w/ subQ air in chest noted on CXR - chest tubes in place w/ no air leak - dense adhesions surrounding right lung noted in surgery   Mobilize  Wean Neo as tolerated  Wean milrinone slowly  Start diuretics later today or tomorrow once BP stable off Neo  Leave chest tubes in for now  Start coumadin slowly  Watch platelet count and avoid heparin or lovenox for now   Purcell Nailslarence H Amauria Younts, MD 10/02/2016 7:49 AM

## 2016-10-02 NOTE — Progress Notes (Signed)
Initial Nutrition Assessment  DOCUMENTATION CODES:   Not applicable  INTERVENTION:    Boost Breeze po TID, each supplement provides 250 kcal and 9 grams of protein  NUTRITION DIAGNOSIS:   Increased nutrient needs related to  (post-op healing) as evidenced by estimated needs  GOAL:   Patient will meet greater than or equal to 90% of their needs  MONITOR:   PO intake, Supplement acceptance, Labs, Weight trends, Skin, I & O's  REASON FOR ASSESSMENT:   Malnutrition Screening Tool  ASSESSMENT:   75 yo Male with post-op dx of mitral stenosis, mitral reurgitation and moderate tricuspid regurgitation.  Pt s/p procedures 11/2: REDO MITRAL VALVE REPLACEMENT (MVR) TRICUSPID VALVE REPAIR  Spoke with patient and wife at bedside. Pt reports his appetite is OK; was eating well PTA. Wife reveals he's had unintentional weight loss of 25 lbs in the past 2-3 months. Would benefit from addition of oral nutrition supplements >> will order. Labs and medications reviewed.  Nutrition focused physical exam completed.  No muscle or subcutaneous fat depletion noticed.  Diet Order:  Diet clear liquid Room service appropriate? Yes; Fluid consistency: Thin Diet clear liquid Room service appropriate? Yes; Fluid consistency: Thin  Skin:  Reviewed, no issues  Last BM:  N/A  Height:   Ht Readings from Last 1 Encounters:  10/01/16 5\' 11"  (1.803 m)    Weight:   Wt Readings from Last 1 Encounters:  10/02/16 162 lb 11.2 oz (73.8 kg)    Ideal Body Weight:  78.1 kg  BMI:  Body mass index is 22.69 kg/m.  Estimated Nutritional Needs:   Kcal:  1850-2050  Protein:  110-120 gm  Fluid:  1.8-2.0 L  EDUCATION NEEDS:   No education needs identified at this time  Maureen ChattersKatie Senai Ramnath, RD, LDN Pager #: [phone] After-Hours Pager #: [phone]

## 2016-10-02 NOTE — Care Management Note (Signed)
Case Management Note  Patient Details  Name: Esaul L Branscum MRN: [id number] Date of Birth: 01/28/1941  Subjective/Objective:   S/p MVR                 Action/Plan:  PTA from home with wife.  CM will continue to follow for discharge needs   Expected Discharge Date:                  Expected Discharge Plan:  Home/Self Care  In-House Referral:     Discharge planning Services  CM Consult  Post Acute Care Choice:    Choice offered to:     DME Arranged:    DME Agency:     HH Arranged:    HH Agency:     Status of Service:  In process, will continue to follow  If discussed at Long Length of Stay Meetings, dates discussed:    Additional Comments:  Cherylann ParrClaxton, Kaeya Schiffer S, RN 10/02/2016, 10:47 AM

## 2016-10-02 NOTE — Progress Notes (Signed)
Patient ID: Carlos Norton, Carlos Norton   DOB: May 30, 1941, 75 y.o.   MRN: [id number]  SICU Evening Rounds:   Hemodynamically stable  0.2 mcg milrinone   Urine output good  CT output low  CBC    Component Value Date/Time   WBC 8.5 10/02/2016 1659   RBC 3.37 (L) 10/02/2016 1659   HGB 8.5 (L) 10/02/2016 1659   HCT 26.8 (L) 10/02/2016 1659   PLT 104 (L) 10/02/2016 1659   MCV 79.5 10/02/2016 1659   MCH 25.2 (L) 10/02/2016 1659   MCHC 31.7 10/02/2016 1659   RDW 22.3 (H) 10/02/2016 1659   LYMPHSABS 0.9 06/22/2010 0325   MONOABS 0.8 06/22/2010 0325   EOSABS 0.2 06/22/2010 0325   BASOSABS 0.0 06/22/2010 0325     BMET    Component Value Date/Time   NA 133 (L) 10/02/2016 1616   K 4.2 10/02/2016 1616   CL 99 (L) 10/02/2016 1616   CO2 23 10/02/2016 0426   GLUCOSE 135 (H) 10/02/2016 1616   BUN 17 10/02/2016 1616   CREATININE 1.09 10/02/2016 1659   CALCIUM 7.9 (L) 10/02/2016 0426   GFRNONAA >60 10/02/2016 1659   GFRAA >60 10/02/2016 1659     A/P:  Stable postop course. Continue current plans

## 2016-10-03 ENCOUNTER — Inpatient Hospital Stay (HOSPITAL_COMMUNITY): Payer: Medicare Other

## 2016-10-03 LAB — CBC
HEMATOCRIT: 27.5 % — AB (ref 39.0–52.0)
HEMOGLOBIN: 8.5 g/dL — AB (ref 13.0–17.0)
MCH: 24.5 pg — AB (ref 26.0–34.0)
MCHC: 30.9 g/dL (ref 30.0–36.0)
MCV: 79.3 fL (ref 78.0–100.0)
Platelets: 108 10*3/uL — ABNORMAL LOW (ref 150–400)
RBC: 3.47 MIL/uL — ABNORMAL LOW (ref 4.22–5.81)
RDW: 22.1 % — ABNORMAL HIGH (ref 11.5–15.5)
WBC: 8.9 10*3/uL (ref 4.0–10.5)

## 2016-10-03 LAB — TYPE AND SCREEN
ABO/RH(D): A POS
Antibody Screen: NEGATIVE
UNIT DIVISION: 0
UNIT DIVISION: 0
UNIT DIVISION: 0
Unit division: 0
Unit division: 0
Unit division: 0

## 2016-10-03 LAB — BASIC METABOLIC PANEL
ANION GAP: 5 (ref 5–15)
BUN: 19 mg/dL (ref 6–20)
CHLORIDE: 102 mmol/L (ref 101–111)
CO2: 24 mmol/L (ref 22–32)
Calcium: 8.2 mg/dL — ABNORMAL LOW (ref 8.9–10.3)
Creatinine, Ser: 1.04 mg/dL (ref 0.61–1.24)
GFR calc Af Amer: >60 mL/min (ref >60)
GLUCOSE: 86 mg/dL (ref 65–99)
POTASSIUM: 4 mmol/L (ref 3.5–5.1)
Sodium: 131 mmol/L — ABNORMAL LOW (ref 135–145)

## 2016-10-03 LAB — PROTIME-INR
INR: 1.39
Prothrombin Time: 17.2 seconds — ABNORMAL HIGH (ref 11.4–15.2)

## 2016-10-03 LAB — COOXEMETRY PANEL
CARBOXYHEMOGLOBIN: 2.3 % — AB (ref 0.5–1.5)
METHEMOGLOBIN: 1 % (ref 0.0–1.5)
O2 Saturation: 65.2 %
Total hemoglobin: 8.7 g/dL — ABNORMAL LOW (ref 12.0–16.0)

## 2016-10-03 MED ORDER — MILRINONE LACTATE IN DEXTROSE 20-5 MG/100ML-% IV SOLN
0.1000 ug/kg/min | INTRAVENOUS | Status: DC
  Administered 2016-10-04: 0.1 ug/kg/min via INTRAVENOUS
  Filled 2016-10-03: qty 100

## 2016-10-03 NOTE — Progress Notes (Signed)
Arterial line pulled and pressure held on site for 7 minutes. Bandage placed and patient showing mobility in all five fingers.

## 2016-10-03 NOTE — Progress Notes (Signed)
Patient ID: Carlos Norton, male   DOB: 11-07-1941, 75 y.o.   MRN: [id number]   SICU Evening Rounds:   Hemodynamically stable  Milrinone 0.1    Urine output good  CT output low  CBC    Component Value Date/Time   WBC 8.9 10/03/2016 0407   RBC 3.47 (L) 10/03/2016 0407   HGB 8.5 (L) 10/03/2016 0407   HCT 27.5 (L) 10/03/2016 0407   PLT 108 (L) 10/03/2016 0407   MCV 79.3 10/03/2016 0407   MCH 24.5 (L) 10/03/2016 0407   MCHC 30.9 10/03/2016 0407   RDW 22.1 (H) 10/03/2016 0407   LYMPHSABS 0.9 06/22/2010 0325   MONOABS 0.8 06/22/2010 0325   EOSABS 0.2 06/22/2010 0325   BASOSABS 0.0 06/22/2010 0325     BMET    Component Value Date/Time   NA 131 (L) 10/03/2016 0407   K 4.0 10/03/2016 0407   CL 102 10/03/2016 0407   CO2 24 10/03/2016 0407   GLUCOSE 86 10/03/2016 0407   BUN 19 10/03/2016 0407   CREATININE 1.04 10/03/2016 0407   CALCIUM 8.2 (L) 10/03/2016 0407   GFRNONAA >60 10/03/2016 0407   GFRAA >60 10/03/2016 0407     A/P:  Stable postop course. Continue current plans

## 2016-10-03 NOTE — Progress Notes (Signed)
2 Days Post-Op Procedure(s) (LRB): REDO MITRAL VALVE REPLACEMENT (MVR) implanted with Magna Mitral Ease size 29mm (N/A) TRICUSPID VALVE REPAIR with Edwards MC 3 size 28 (N/A) TRANSESOPHAGEAL ECHOCARDIOGRAM (TEE) (N/A) Subjective: No specific complaints  Objective: Vital signs in last 24 hours: Temp:  [97.6 F (36.4 C)-99.1 F (37.3 C)] 97.6 F (36.4 C) (11/04 1100) Pulse Rate:  [77-90] 89 (11/04 1015) Cardiac Rhythm: A-V Sequential paced (11/04 1207) Resp:  [13-31] 28 (11/04 1015) BP: (80-116)/(46-71) 99/63 (11/04 1015) SpO2:  [90 %-100 %] 100 % (11/04 1015) Arterial Line BP: (75-131)/(38-66) 108/47 (11/04 1015) Weight:  [74.8 kg (164 lb 14.5 oz)] 74.8 kg (164 lb 14.5 oz) (11/04 0630)  Hemodynamic parameters for last 24 hours:    Intake/Output from previous day: 11/03 0701 - 11/04 0700 In: 2474.8 [P.O.:1920; I.V.:454.8; IV Piggyback:100] Out: 1280 [Urine:540; Chest Tube:740] Intake/Output this shift: Total I/O In: 552.7 [P.O.:360; I.V.:192.7] Out: 240 [Urine:140; Chest Tube:100]  General appearance: alert and cooperative Neurologic: intact Heart: regular rate and rhythm, S1, S2 normal, no murmur, click, rub or gallop Lungs: rhonchi bibasilar Extremities: edema mild Wound: dressings dry Small air leak from pleural tubes  Lab Results:  Recent Labs  10/02/16 1659 10/03/16 0407  WBC 8.5 8.9  HGB 8.5* 8.5*  HCT 26.8* 27.5*  PLT 104* 108*   BMET:  Recent Labs  10/02/16 0426 10/02/16 1616 10/02/16 1659 10/03/16 0407  NA 133* 133*  --  131*  K 4.1 4.2  --  4.0  CL 105 99*  --  102  CO2 23  --   --  24  GLUCOSE 111* 135*  --  86  BUN 14 17  --  19  CREATININE 0.97 1.00 1.09 1.04  CALCIUM 7.9*  --   --  8.2*    PT/INR:  Recent Labs  10/03/16 0407  LABPROT 17.2*  INR 1.39   ABG    Component Value Date/Time   PHART 7.413 10/01/2016 2349   HCO3 22.9 10/01/2016 2349   TCO2 22 10/02/2016 1616   ACIDBASEDEF 2.0 10/01/2016 2349   O2SAT 65.2 10/03/2016  0316   CBG (last 3)   Recent Labs  10/02/16 1225 10/02/16 1545 10/02/16 1915  GLUCAP 97 119* 109*   CLINICAL DATA:  Followup respiratory distress.  EXAM: PORTABLE CHEST 1 VIEW  COMPARISON:  10/02/2016  FINDINGS: Patchy airspace opacity noted in the lung bases is similar to the prior exam. There are bilateral pleural effusions, also stable.  Paraseptal emphysema is noted at the lung apices. No convincing pneumothorax.  There is subcutaneous emphysema over the right chest wall and right neck base which has developed since the 09/2001/17.  No mediastinal widening.  Swan-Ganz catheter has been removed since the prior study. Right internal jugular Cordis remains in place. Left and right chest tubes and mediastinal tube are unchanged.  IMPRESSION: 1. No change in lung aeration when compared to the previous exam. There are persistent patchy airspace opacities at the lung bases associated with bilateral pleural effusions. Lung base opacity is likely atelectasis. A component of congestive heart failure and pulmonary edema is suspected. 2. No convincing pneumothorax. There has been the development of right chest wall and neck base subcutaneous emphysema since the chest radiograph performed 2 days ago. 3. Remaining support apparatus is stable. 4. No mediastinal widening. 5.   Electronically Signed   By: Amie Portlandavid  Ormond M.D.   On: 10/03/2016 07:59  Assessment/Plan: S/P Procedure(s) (LRB): REDO MITRAL VALVE REPLACEMENT (MVR) implanted with Magna Mitral Ease  size 29mm (N/A) TRICUSPID VALVE REPAIR with Edwards MC 3 size 28 (N/A) TRANSESOPHAGEAL ECHOCARDIOGRAM (TEE) (N/A)  He is hemodynamically stable on milrinone 0.2 and low dose neo with Co-ox of 65%. Will decrease milrinone to 0.1  Rhythm is junctional in the 60's. Will AV pace for now to maximize BP and CO. Hold amiodarone for now.  DC MT's and keep pleural tubes in.  Coumadin ordered  Hold on diuresis  until off neo.  IS, mobilization.   LOS: 2 days    Alleen BorneBryan K Britanni Yarde 10/03/2016

## 2016-10-04 ENCOUNTER — Inpatient Hospital Stay (HOSPITAL_COMMUNITY): Payer: Medicare Other

## 2016-10-04 LAB — BASIC METABOLIC PANEL
Anion gap: 6 (ref 5–15)
BUN: 20 mg/dL (ref 6–20)
CALCIUM: 8 mg/dL — AB (ref 8.9–10.3)
CO2: 24 mmol/L (ref 22–32)
CREATININE: 0.78 mg/dL (ref 0.61–1.24)
Chloride: 101 mmol/L (ref 101–111)
GFR calc Af Amer: >60 mL/min (ref >60)
GLUCOSE: 93 mg/dL (ref 65–99)
Potassium: 4 mmol/L (ref 3.5–5.1)
Sodium: 131 mmol/L — ABNORMAL LOW (ref 135–145)

## 2016-10-04 LAB — COOXEMETRY PANEL
CARBOXYHEMOGLOBIN: 2.2 % — AB (ref 0.5–1.5)
METHEMOGLOBIN: 0.9 % (ref 0.0–1.5)
O2 SAT: 51.9 %
TOTAL HEMOGLOBIN: 8.4 g/dL — AB (ref 12.0–16.0)

## 2016-10-04 LAB — CBC
HCT: 25.6 % — ABNORMAL LOW (ref 39.0–52.0)
HEMOGLOBIN: 8 g/dL — AB (ref 13.0–17.0)
MCH: 24.8 pg — AB (ref 26.0–34.0)
MCHC: 31.3 g/dL (ref 30.0–36.0)
MCV: 79.5 fL (ref 78.0–100.0)
PLATELETS: 98 10*3/uL — AB (ref 150–400)
RBC: 3.22 MIL/uL — ABNORMAL LOW (ref 4.22–5.81)
RDW: 22.4 % — AB (ref 11.5–15.5)
WBC: 5.3 10*3/uL (ref 4.0–10.5)

## 2016-10-04 LAB — PROTIME-INR
INR: 1.33
PROTHROMBIN TIME: 16.6 s — AB (ref 11.4–15.2)

## 2016-10-04 LAB — PREPARE RBC (CROSSMATCH)

## 2016-10-04 MED ORDER — WARFARIN SODIUM 2 MG PO TABS
2.0000 mg | ORAL_TABLET | Freq: Every day | ORAL | Status: DC
  Administered 2016-10-04: 2 mg via ORAL
  Filled 2016-10-04: qty 1

## 2016-10-04 MED ORDER — SODIUM CHLORIDE 0.9 % IV SOLN
Freq: Once | INTRAVENOUS | Status: AC
  Administered 2016-10-04: 13:00:00 via INTRAVENOUS

## 2016-10-04 MED ORDER — FUROSEMIDE 10 MG/ML IJ SOLN
40.0000 mg | Freq: Once | INTRAMUSCULAR | Status: AC
  Administered 2016-10-04: 40 mg via INTRAVENOUS
  Filled 2016-10-04: qty 4

## 2016-10-04 MED ORDER — GUAIFENESIN ER 600 MG PO TB12
600.0000 mg | ORAL_TABLET | Freq: Two times a day (BID) | ORAL | Status: AC
  Administered 2016-10-04 – 2016-10-06 (×6): 600 mg via ORAL
  Filled 2016-10-04 (×6): qty 1

## 2016-10-04 MED ORDER — FERROUS GLUCONATE 324 (38 FE) MG PO TABS
324.0000 mg | ORAL_TABLET | Freq: Two times a day (BID) | ORAL | Status: DC
  Administered 2016-10-04 – 2016-10-07 (×7): 324 mg via ORAL
  Filled 2016-10-04 (×7): qty 1

## 2016-10-04 NOTE — Progress Notes (Signed)
3 Days Post-Op Procedure(s) (LRB): REDO MITRAL VALVE REPLACEMENT (MVR) implanted with Magna Mitral Ease size 29mm (N/A) TRICUSPID VALVE REPAIR with Edwards MC 3 size 28 (N/A) TRANSESOPHAGEAL ECHOCARDIOGRAM (TEE) (N/A) Subjective:  No complaints  Objective: Vital signs in last 24 hours: Temp:  [97.8 F (36.6 C)-98.8 F (37.1 C)] 98.5 F (36.9 C) (11/05 0700) Pulse Rate:  [66-89] 82 (11/05 1100) Cardiac Rhythm: A-V Sequential paced (11/05 1100) Resp:  [13-27] 23 (11/05 1100) BP: (78-116)/(52-73) 109/62 (11/05 1100) SpO2:  [89 %-100 %] 93 % (11/05 1100) Arterial Line BP: (103-134)/(39-61) 107/41 (11/04 2200)  Hemodynamic parameters for last 24 hours:    Intake/Output from previous day: 11/04 0701 - 11/05 0700 In: 2022.2 [P.O.:1320; I.V.:702.2] Out: 1135 [Urine:855; Chest Tube:280] Intake/Output this shift: Total I/O In: 528.8 [P.O.:440; I.V.:88.8] Out: 50 [Urine:50]  General appearance: alert and cooperative Heart: regular rate and rhythm, S1, S2 normal, no murmur, click, rub or gallop Lungs: rales bilaterally Extremities: edema mild Wound: dressing dry chest tube output low. I don't see any air leak   Lab Results:  Recent Labs  10/03/16 0407 10/04/16 0400  WBC 8.9 5.3  HGB 8.5* 8.0*  HCT 27.5* 25.6*  PLT 108* 98*   BMET:  Recent Labs  10/03/16 0407 10/04/16 0400  NA 131* 131*  K 4.0 4.0  CL 102 101  CO2 24 24  GLUCOSE 86 93  BUN 19 20  CREATININE 1.04 0.78  CALCIUM 8.2* 8.0*    PT/INR:  Recent Labs  10/04/16 0400  LABPROT 16.6*  INR 1.33   ABG    Component Value Date/Time   PHART 7.413 10/01/2016 2349   HCO3 22.9 10/01/2016 2349   TCO2 22 10/02/2016 1616   ACIDBASEDEF 2.0 10/01/2016 2349   O2SAT 51.9 10/04/2016 0350   CBG (last 3)   Recent Labs  10/02/16 1225 10/02/16 1545 10/02/16 1915  GLUCAP 97 119* 109*   CLINICAL DATA:  Post MVR  EXAM: PORTABLE CHEST 1 VIEW  COMPARISON:  Portable exam 0525 hours compared  10/03/2016  FINDINGS: BILATERAL thoracostomy tubes again identified.  RIGHT jugular central venous catheter tip projects over proximal SVC.  Slight rotation to the RIGHT.  Enlargement of cardiac silhouette post MVR.  Pulmonary vascular congestion and persistent perihilar edema persists.  Question small basilar effusions.  Chest wall emphysema lateral RIGHT chest extending into axilla and RIGHT supraclavicular region.  No definite pneumothorax.  IMPRESSION: Enlargement of cardiac silhouette post MVR with pulmonary vascular congestion and persistent pulmonary edema.   Electronically Signed   By: Ulyses SouthwardMark  Boles M.D.   On: 10/04/2016 07:59  Assessment/Plan: S/P Procedure(s) (LRB): REDO MITRAL VALVE REPLACEMENT (MVR) implanted with Magna Mitral Ease size 29mm (N/A) TRICUSPID VALVE REPAIR with Edwards MC 3 size 28 (N/A) TRANSESOPHAGEAL ECHOCARDIOGRAM (TEE) (N/A)  He is hemodynamically stable on milrinone 0.1 but Co-ox is 52 and his BP is borderline in the 90-110 range. He is AV paced at  80 to maximize CO. His Hgb has trended down to 8.0 so I think it would help to give him a unit of PRBC's. Will continue milrinone at 0.1 today.  Volume excess with bilateral rales and weight is 15 lbs over preop. Will diurese after transfusion as BP allows. Renal function normal  Bilateral atelectasis and pulmonary edema. Will keep chest tubes in to to prevent effusion, work on IS ( only doing 750 max), diurese, IS, add Mucinex and continue bronchodilators.  He is ambulating well.  Encourage nutrition.  Continue coumadin for MVR. INR  stalled. Will increase to 2 mg daily.   LOS: 3 days    Alleen BorneBryan K Bartle 10/04/2016

## 2016-10-04 NOTE — Progress Notes (Signed)
Patient ID: Carlos Norton, male   DOB: 1941-10-29, 75 y.o.   MRN: [id number]  SICU Evening Rounds:  Hemodynamically stable today. Remains on 0.1 milrinone.  BP better after transfusion. Transfused 1 units prbc's and diuresing l

## 2016-10-05 ENCOUNTER — Inpatient Hospital Stay (HOSPITAL_COMMUNITY): Payer: Medicare Other

## 2016-10-05 LAB — CBC
HEMATOCRIT: 26.5 % — AB (ref 39.0–52.0)
Hemoglobin: 8.4 g/dL — ABNORMAL LOW (ref 13.0–17.0)
MCH: 25.2 pg — AB (ref 26.0–34.0)
MCHC: 31.7 g/dL (ref 30.0–36.0)
MCV: 79.6 fL (ref 78.0–100.0)
PLATELETS: 108 10*3/uL — AB (ref 150–400)
RBC: 3.33 MIL/uL — AB (ref 4.22–5.81)
RDW: 21.4 % — ABNORMAL HIGH (ref 11.5–15.5)
WBC: 4.4 10*3/uL (ref 4.0–10.5)

## 2016-10-05 LAB — BASIC METABOLIC PANEL
ANION GAP: 5 (ref 5–15)
BUN: 18 mg/dL (ref 6–20)
CO2: 27 mmol/L (ref 22–32)
Calcium: 8.1 mg/dL — ABNORMAL LOW (ref 8.9–10.3)
Chloride: 101 mmol/L (ref 101–111)
Creatinine, Ser: 0.83 mg/dL (ref 0.61–1.24)
GFR calc Af Amer: >60 mL/min (ref >60)
GLUCOSE: 98 mg/dL (ref 65–99)
POTASSIUM: 3.7 mmol/L (ref 3.5–5.1)
Sodium: 133 mmol/L — ABNORMAL LOW (ref 135–145)

## 2016-10-05 LAB — COOXEMETRY PANEL
CARBOXYHEMOGLOBIN: 3.1 % — AB (ref 0.5–1.5)
METHEMOGLOBIN: 0.8 % (ref 0.0–1.5)
O2 Saturation: 73.9 %
Total hemoglobin: 8.1 g/dL — ABNORMAL LOW (ref 12.0–16.0)

## 2016-10-05 LAB — GLUCOSE, CAPILLARY
GLUCOSE-CAPILLARY: 70 mg/dL (ref 65–99)
GLUCOSE-CAPILLARY: 57 mg/dL — AB (ref 65–99)
Glucose-Capillary: 104 mg/dL — ABNORMAL HIGH (ref 65–99)

## 2016-10-05 LAB — TYPE AND SCREEN
ABO/RH(D): A POS
Antibody Screen: NEGATIVE
Unit division: 0

## 2016-10-05 LAB — PROTIME-INR
INR: 1.34
Prothrombin Time: 16.7 seconds — ABNORMAL HIGH (ref 11.4–15.2)

## 2016-10-05 MED ORDER — POTASSIUM CHLORIDE ER 10 MEQ PO TBCR: 20.0000 meq | EXTENDED_RELEASE_TABLET | Freq: Every day | ORAL | Status: DC

## 2016-10-05 MED ORDER — MOVING RIGHT ALONG BOOK
Freq: Once | Status: AC
  Administered 2016-10-05: 08:00:00
  Filled 2016-10-05: qty 1

## 2016-10-05 MED ORDER — FA-PYRIDOXINE-CYANOCOBALAMIN 2.5-25-2 MG PO TABS
1.0000 | ORAL_TABLET | Freq: Every day | ORAL | Status: DC
  Administered 2016-10-05 – 2016-10-07 (×3): 1 via ORAL
  Filled 2016-10-05 (×4): qty 1

## 2016-10-05 MED ORDER — FUROSEMIDE 10 MG/ML IJ SOLN
40.0000 mg | Freq: Two times a day (BID) | INTRAMUSCULAR | Status: DC
  Administered 2016-10-05 – 2016-10-06 (×3): 40 mg via INTRAVENOUS
  Filled 2016-10-05 (×4): qty 4

## 2016-10-05 MED ORDER — WARFARIN SODIUM 2.5 MG PO TABS
2.5000 mg | ORAL_TABLET | Freq: Every day | ORAL | Status: DC
  Administered 2016-10-05: 2.5 mg via ORAL
  Filled 2016-10-05: qty 1

## 2016-10-05 MED ORDER — FUROSEMIDE 40 MG PO TABS: 40.0000 mg | ORAL_TABLET | Freq: Two times a day (BID) | ORAL | Status: DC

## 2016-10-05 MED ORDER — AMIODARONE HCL 200 MG PO TABS
200.0000 mg | ORAL_TABLET | Freq: Every day | ORAL | Status: DC
  Administered 2016-10-05 – 2016-10-07 (×3): 200 mg via ORAL
  Filled 2016-10-05 (×3): qty 1

## 2016-10-05 MED ORDER — POTASSIUM CHLORIDE CRYS ER 20 MEQ PO TBCR
20.0000 meq | EXTENDED_RELEASE_TABLET | Freq: Two times a day (BID) | ORAL | Status: DC
  Administered 2016-10-06 (×2): 20 meq via ORAL
  Filled 2016-10-05 (×2): qty 1

## 2016-10-05 MED ORDER — SODIUM CHLORIDE 0.9% FLUSH
3.0000 mL | Freq: Two times a day (BID) | INTRAVENOUS | Status: DC
  Administered 2016-10-05 – 2016-10-07 (×4): 3 mL via INTRAVENOUS

## 2016-10-05 MED ORDER — POTASSIUM CHLORIDE 10 MEQ/50ML IV SOLN
10.0000 meq | INTRAVENOUS | Status: AC
  Administered 2016-10-05 (×3): 10 meq via INTRAVENOUS
  Filled 2016-10-05 (×2): qty 50

## 2016-10-05 MED ORDER — SODIUM CHLORIDE 0.9% FLUSH: 3.0000 mL | INTRAVENOUS | Status: DC | PRN

## 2016-10-05 MED ORDER — POTASSIUM CHLORIDE 10 MEQ/50ML IV SOLN
10.0000 meq | Freq: Once | INTRAVENOUS | Status: AC
  Administered 2016-10-05: 10 meq via INTRAVENOUS
  Filled 2016-10-05: qty 50

## 2016-10-05 MED ORDER — SODIUM CHLORIDE 0.9 % IV SOLN: 250.0000 mL | INTRAVENOUS | Status: DC | PRN

## 2016-10-05 NOTE — Care Management Important Message (Signed)
Important Message  Patient Details  Name: Carlos Norton MRN: [id number] Date of Birth: 12/28/1940   Medicare Important Message Given:  Yes    Kyla BalzarineShealy, Dorris Pierre Abena 10/05/2016, 11:38 AM

## 2016-10-05 NOTE — Progress Notes (Addendum)
[address]       [name] [id number]             [phone]        CARDIOTHORACIC SURGERY PROGRESS NOTE   R4 Days Post-Op Procedure(s) (LRB): REDO MITRAL VALVE REPLACEMENT (MVR) implanted with Magna Mitral Ease size 29mm (N/A) TRICUSPID VALVE REPAIR with Edwards MC 3 size 28 (N/A) TRANSESOPHAGEAL ECHOCARDIOGRAM (TEE) (N/A)  Subjective: Looks good and feels well.  No SOB.  Mild soreness in chest.  Good appetite.  Objective: Vital signs: BP Readings from Last 1 Encounters:  10/05/16 118/68   Pulse Readings from Last 1 Encounters:  10/05/16 81   Resp Readings from Last 1 Encounters:  10/05/16 17   Temp Readings from Last 1 Encounters:  10/05/16 98.4 F (36.9 C) (Oral)    Hemodynamics:  Mixed venous co-ox 73.9%    Physical Exam:  Rhythm:   sinus  Breath sounds: Clear except few inspiratory rales, no wheezes or rhonchi  Heart sounds:  RRR  Incisions:  Clean and dry  Abdomen:  Soft, non-distended, non-tender  Extremities:  Warm, well-perfused  Chest tubes:  low volume thin serosanguinous output, no air leak    Intake/Output from previous day: 11/05 0701 - 11/06 0700 In: 1715.6 [P.O.:690; I.V.:640.6; Blood:335; IV Piggyback:50] Out: 1855 [Urine:1705; Chest Tube:150] Intake/Output this shift: No intake/output data recorded.  Lab Results:  CBC: Recent Labs  10/04/16 0400 10/05/16 0417  WBC 5.3 4.4  HGB 8.0* 8.4*  HCT 25.6* 26.5*  PLT 98* 108*    BMET:  Recent Labs  10/04/16 0400 10/05/16 0417  NA 131* 133*  K 4.0 3.7  CL 101 101  CO2 24 27  GLUCOSE 93 98  BUN 20 18  CREATININE 0.78 0.83  CALCIUM 8.0* 8.1*     PT/INR:   Recent Labs  10/05/16 0417  LABPROT 16.7*  INR 1.34    CBG (last 3)   Recent Labs  10/02/16 1545 10/02/16 1915 10/05/16 0711  GLUCAP 119* 109* 70    ABG    Component Value Date/Time   PHART 7.413 10/01/2016 2349   PCO2ART 35.6 10/01/2016 2349   PO2ART 57.0 (L) 10/01/2016 2349   HCO3 22.9 10/01/2016 2349   TCO2 22 10/02/2016 1616   ACIDBASEDEF 2.0 10/01/2016 2349   O2SAT 73.9 10/05/2016 0405    CXR: PORTABLE CHEST 1 VIEW  COMPARISON:  Portable chest x-ray of October 04, 2016  FINDINGS: The left lung is well-expanded. There is some volume loss on the right. There is significant subcutaneous emphysema on the right. No definite right-sided pneumothorax or pneumomediastinum is observed. The right chest tube tip projects in the medial cardiophrenic angle. The left chest tube projects peripherally in the mid lung at approximately the seventh rib. There is no pneumothorax. The cardiac silhouette is enlarged. The prosthetic valve rings are visible. The pulmonary vascularity is indistinct. The pulmonary interstitial markings are increased diffusely but are fairly stable. A small left pleural effusion is suspected. The right internal jugular Cordis sheath tip projects over the proximal SVC.  IMPRESSION: Fairly stable appearance of the chest since yesterday's study. Persistent pulmonary interstitial edema. The chest tubes are in stable position without evidence of a pneumothorax. A large amount of subcutaneous emphysema is present in the right axillary region and right neck base.   Electronically Signed   By: David  SwazilandJordan M.D.   On: 10/05/2016 07:27  Assessment/Plan: S/P Procedure(s) (LRB): REDO MITRAL VALVE REPLACEMENT (MVR) implanted  with Magna Mitral Ease size 29mm (N/A) TRICUSPID VALVE REPAIR with Edwards MC 3 size 28 (N/A) TRANSESOPHAGEAL ECHOCARDIOGRAM (TEE) (N/A)  Doing well POD4 Maintaining NSR w/ stable BP, co-ox 74% on milrinone 0.1 mcg/kg/min Breathing comfortably w/ O2 sats 98-100% on RA Chronic diastolic CHF with expected post-op volume excess, I/O's positive over the weekend and weight still 14 lbs > baseline  Preop anemia w/ expected post op acute blood loss anemia, Hgb up slightly 8.4 this morning after transfusion yesterday Post op  thrombocytopenia, platelet count up to 108k this morning Expected post op atelectasis, mild Hypokalemia, induced by loop diuretics Rheumatic heart disease w/ moderate-severe pulmonary hypertension preop 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

## 2016-10-05 NOTE — Progress Notes (Signed)
K+= 3.7and creat=  0.83 w/ urine o/p > 30cc/hr; TCTS KCL protocol initiated with 10 mEq KCL in 50cc IV x 3, each over one hour.

## 2016-10-05 NOTE — Progress Notes (Signed)
Patient ambulated from 2S16 to 2W25 with wheelchair, patient tolerated walk well, patient in bed and placed on tele, call bell in reach with receiving RN at bedside.  Hermina BartersBOWMAN, Makila Colombe M, RN

## 2016-10-05 NOTE — Progress Notes (Signed)
Patient admitted to room 2W25 , A&O x4, VSS; placed on tele and CCMD notified. Patient has no questions or concerns at this time. Oriented to room and call light placed within reach.

## 2016-10-06 ENCOUNTER — Inpatient Hospital Stay (HOSPITAL_COMMUNITY): Payer: Medicare Other

## 2016-10-06 LAB — BASIC METABOLIC PANEL
ANION GAP: 10 (ref 5–15)
BUN: 15 mg/dL (ref 6–20)
CALCIUM: 8.5 mg/dL — AB (ref 8.9–10.3)
CO2: 27 mmol/L (ref 22–32)
Chloride: 98 mmol/L — ABNORMAL LOW (ref 101–111)
Creatinine, Ser: 0.78 mg/dL (ref 0.61–1.24)
GLUCOSE: 80 mg/dL (ref 65–99)
POTASSIUM: 3.5 mmol/L (ref 3.5–5.1)
SODIUM: 135 mmol/L (ref 135–145)

## 2016-10-06 LAB — CBC
HCT: 28.6 % — ABNORMAL LOW (ref 39.0–52.0)
Hemoglobin: 9 g/dL — ABNORMAL LOW (ref 13.0–17.0)
MCH: 25.1 pg — ABNORMAL LOW (ref 26.0–34.0)
MCHC: 31.5 g/dL (ref 30.0–36.0)
MCV: 79.7 fL (ref 78.0–100.0)
PLATELETS: 139 10*3/uL — AB (ref 150–400)
RBC: 3.59 MIL/uL — AB (ref 4.22–5.81)
RDW: 21.3 % — AB (ref 11.5–15.5)
WBC: 4.6 10*3/uL (ref 4.0–10.5)

## 2016-10-06 LAB — PROTIME-INR
INR: 1.21
PROTHROMBIN TIME: 15.4 s — AB (ref 11.4–15.2)

## 2016-10-06 MED ORDER — POTASSIUM CHLORIDE CRYS ER 20 MEQ PO TBCR
40.0000 meq | EXTENDED_RELEASE_TABLET | Freq: Once | ORAL | Status: AC
  Administered 2016-10-06: 40 meq via ORAL
  Filled 2016-10-06: qty 2

## 2016-10-06 MED ORDER — WARFARIN SODIUM 5 MG PO TABS
5.0000 mg | ORAL_TABLET | Freq: Every day | ORAL | Status: DC
  Administered 2016-10-06 – 2016-10-07 (×2): 5 mg via ORAL
  Filled 2016-10-06 (×2): qty 1

## 2016-10-06 MED ORDER — LACTULOSE 10 GM/15ML PO SOLN
20.0000 g | Freq: Once | ORAL | Status: DC
  Filled 2016-10-06: qty 30

## 2016-10-06 MED FILL — Heparin Sodium (Porcine) Inj 1000 Unit/ML: INTRAMUSCULAR | Qty: 30 | Status: AC

## 2016-10-06 MED FILL — Potassium Chloride Inj 2 mEq/ML: INTRAVENOUS | Qty: 40 | Status: AC

## 2016-10-06 MED FILL — Magnesium Sulfate Inj 50%: INTRAMUSCULAR | Qty: 10 | Status: AC

## 2016-10-06 NOTE — Progress Notes (Signed)
Removed epicardial wires per order. 3 intact.  Pt tolerated procedure well.  Pt instructed to remain on bedrest for one hour.  Frequent vitals will be taken and documented. Pt rhythm first degree with some PVC's.  Wires came out without any resistance but episode of PCV's noted on strip.  Called PA Donielle and made aware and MD will be called if any changes in pt status. Pt resumed previous rhythm of first degree with PVC's.  Pt resting with call bell within reach. Thomas HoffBurton, [place] Pandey McClintock, RN

## 2016-10-06 NOTE — Discharge Instructions (Signed)
Mitral Valve Replacement, Care After Refer to this sheet in the next few weeks. These instructions provide you with information on caring for yourself after your procedure. Your health care provider may also give you specific instructions. Your treatment has been planned according to current medical practices, but problems sometimes occur. Call your health care provider if you have any problems or questions after your procedure.  HOME CARE INSTRUCTIONS   Take medicines only as directed by your health care provider.  Take your temperature every morning for the first 7 days after surgery. Write these down.  Weigh yourself every morning for at least 7 days after surgery. Write your weight down.  Wear elastic stockings during the day for at least 2 weeks after surgery or as directed by your health care provider. Use them longer if your ankles are swollen. The stockings help blood flow and help reduce swelling in the legs.  Take frequent naps or rest often throughout the day.  Avoid lifting more than 10 lb (4.5 kg) or pushing or pulling things with your arms for 6-8 weeks or as directed by your health care provider.  Avoid driving or airplane travel for 4-6 weeks after surgery or as directed. If you are riding in a car for an extended period, stop every 1-2 hours to stretch your legs.  Avoid crossing your legs.  Avoid climbing stairs and using the handrail to pull yourself up for the first 2-3 weeks after surgery.  Do not take baths for 2-4 weeks after surgery. Take showers once your health care provider approves. Pat incisions dry. Do not rub incisions with a washcloth or towel.  Return to work as directed by your health care provider.  Drink enough fluids to keep your urine clear or pale yellow.  Do not strain to have a bowel movement. Eat high-fiber foods if you become constipated. You may also take a medicine to help you have a bowel movement (laxative) as directed by your health care  provider.  Resume sexual activity as directed by your health care provider. SEEK MEDICAL CARE IF:   You develop a skin rash.   Your weight is increasing each day over 2-3 days.  Your weight increases by 2 or more pounds (1 kg) in a single day.  You have a fever. SEEK IMMEDIATE MEDICAL CARE IF:   You develop chest pain that is not coming from your incision.  You develop shortness of breath or difficulty breathing.  You have drainage, redness, swelling, or pain at your incision site.  You have pus coming from your incision.  You develop light-headedness. MAKE SURE YOU:  Understand these directions.  Will watch your condition.  Will get help right away if you are not doing well or get worse.   This information is not intended to replace advice given to you by your health care provider. Make sure you discuss any questions you have with your health care provider.   Document Released: 06/05/2005 Document Revised: 12/07/2014 Document Reviewed: 04/18/2013 Elsevier Interactive Patient Education 2016 [employer]  Information on my medicine - Coumadin   (Warfarin) Why was Coumadin prescribed for you? Coumadin was prescribed for you because you have a blood clot or a medical condition that can cause an increased risk of forming blood clots. Blood clots can cause serious health problems by blocking the flow of blood to the heart, lung, or brain. Coumadin can prevent harmful blood clots from forming. As a reminder your indication for Coumadin is:  Blood Clot Prevention After Heart Valve Surgery  What test will check on my response to Coumadin? While on Coumadin (warfarin) you will need to have an INR test regularly to ensure that your dose is keeping you in the desired range. The INR (international normalized ratio) number is calculated from the result of the laboratory test called prothrombin time (PT).  If an INR APPOINTMENT HAS NOT ALREADY BEEN MADE FOR YOU please schedule an  appointment to have this lab work done by your health care provider within 7 days. Your INR goal is usually a number between:  2 to 3 or your provider may give you a more narrow range like 2-2.5.  Ask your health care provider during an office visit what your goal INR is.  What  do you need to  know  About  COUMADIN? Take Coumadin (warfarin) exactly as prescribed by your healthcare provider about the same time each day.  DO NOT stop taking without talking to the doctor who prescribed the medication.  Stopping without other blood clot prevention medication to take the place of Coumadin may increase your risk of developing a new clot or stroke.  Get refills before you run out.  What do you do if you miss a dose? If you miss a dose, take it as soon as you remember on the same day then continue your regularly scheduled regimen the next day.  Do not take two doses of Coumadin at the same time.  Important Safety Information A possible side effect of Coumadin (Warfarin) is an increased risk of bleeding. You should call your healthcare provider right away if you experience any of the following: ? Bleeding from an injury or your nose that does not stop. ? Unusual colored urine (red or dark brown) or unusual colored stools (red or black). ? Unusual bruising for unknown reasons. ? A serious fall or if you hit your head (even if there is no bleeding).  Some foods or medicines interact with Coumadin (warfarin) and might alter your response to warfarin. To help avoid this: ? Eat a balanced diet, maintaining a consistent amount of Vitamin K. ? Notify your provider about major diet changes you plan to make. ? Avoid alcohol or limit your intake to 1 drink for women and 2 drinks for men per day. (1 drink is 5 oz. wine, 12 oz. beer, or 1.5 oz. liquor.)  Make sure that ANY health care provider who prescribes medication for you knows that you are taking Coumadin (warfarin).  Also make sure the healthcare provider  who is monitoring your Coumadin knows when you have started a new medication including herbals and non-prescription products.  Coumadin (Warfarin)  Major Drug Interactions  Increased Warfarin Effect Decreased Warfarin Effect  Alcohol (large quantities) Antibiotics (esp. Septra/Bactrim, Flagyl, Cipro) Amiodarone (Cordarone) Aspirin (ASA) Cimetidine (Tagamet) Megestrol (Megace) NSAIDs (ibuprofen, naproxen, etc.) Piroxicam (Feldene) Propafenone (Rythmol SR) Propranolol (Inderal) Isoniazid (INH) Posaconazole (Noxafil) Barbiturates (Phenobarbital) Carbamazepine (Tegretol) Chlordiazepoxide (Librium) Cholestyramine (Questran) Griseofulvin Oral Contraceptives Rifampin Sucralfate (Carafate) Vitamin K   Coumadin (Warfarin) Major Herbal Interactions  Increased Warfarin Effect Decreased Warfarin Effect  Garlic Ginseng Ginkgo biloba Coenzyme Q10 Green tea St. Johns wort    Coumadin (Warfarin) FOOD Interactions  Eat a consistent number of servings per week of foods HIGH in Vitamin K (1 serving =  cup)  Collards (cooked, or boiled & drained) Kale (cooked, or boiled & drained) Mustard greens (cooked, or boiled & drained) Parsley *serving size only =  cup Spinach (cooked,  or boiled & drained) Swiss chard (cooked, or boiled & drained) Turnip greens (cooked, or boiled & drained)  Eat a consistent number of servings per week of foods MEDIUM-HIGH in Vitamin K (1 serving = 1 cup)  Asparagus (cooked, or boiled & drained) Broccoli (cooked, boiled & drained, or raw & chopped) Brussel sprouts (cooked, or boiled & drained) *serving size only =  cup Lettuce, raw (green leaf, endive, romaine) Spinach, raw Turnip greens, raw & chopped   These websites have more information on Coumadin (warfarin):  http://www.king-russell.com/; https://www.hines.net/;

## 2016-10-06 NOTE — Discharge Summary (Signed)
Physician Discharge Summary       [address]       [name] [id number]             [phone]    Patient ID: Carlos Norton MRN: [id number] DOB/AGE: 755-May-1942 75 y.o.  Admit date: 10/01/2016 Discharge date: 10/08/2016  Admission Diagnoses: 1.Rheumatic Heart Disease s/p Mitral Valve Repair-Complex valvuloplasty including CorMatrix patch augmentation of posterior leaflet with 28 mm Sorin Memo 3D ring annuloplasty via right mini thoractomy 2. Severe Mitral Stenosis 3. Moderate Mitral Regurgitation 4. Pulmonary Hypertension 5. Moderate Tricuspid Regurgitation  Active Diagnoses:  1. COPD (chronic obstructive pulmonary disease) (HCC) 2. Chronic diastolic congestive heart failure (HCC) 3. Atrial flutter with rapid ventricular response (HCC) 4. BPH (benign prostatic hyperplasia) 5. Brachial artery occlusion, left (HCC) 6. Coronary artery disease-PCI and stenting of RCA and OM branch 7. GI bleeding-while patient on coumadin and Plavix - no source identified 8. S/P Maze operation for atrial fibrillation-Left side lesion set using cryothermy with oversewing of LA appendage 9. UTI (urinary tract infection)->=100,000 COLONIES/mL KLEBSIELLA OXYTOCA 10. Mild thrombocytopenia     Procedure (s):   Redo Mitral Valve Replacement              Edwards Magna Mitral Bovine Bioprosthetic Tissue Valve (size 29mm, model #7300TFX, serial #[id number])   Tricuspid Valve Repair             Edwards mc3 Ring Annuloplasty (size 28mm, model #4900, serial [id number]) by Dr. Cornelius Moraswen on 10/01/2016.  History of Presenting Illness: This is a 75 year old male with long-standing history of rheumatic heartdisease,coronary artery disease status post PCI and stenting in the past, atrial fibrillation, and COPD who underwent minimally invasive mitral valve repair and Maze procedure on 06/18/2010 for severe mitral regurgitation and recurrent persistent atrial fibrillation. The patient did well  postoperatively and was last seen here in our office on 08/15/2012. He has never had any documented recurrence of atrial fibrillation and he was taken off of Coumadin anticoagulation because of history of intolerance due to GI bleeding while he was taking Coumadin and Plavix prior to his surgery. 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 He was hospitalized at [facility] where he underwent surgical embolectomy on 04/09/2016. He states that initially he was thought to be having a stroke but this was ruled out and ultimately the thrombus in the left brachial artery was identified. Transthoracic echocardiogram performed at that time revealed left ventricular hypertrophy with normal left ventricular systolic function, ejection fraction estimated 55-60%. There was mitral annular calcification with calcification of the mitral valve leaflets and "moderate to severe functional stenosis". The mean transvalvular gradient across the mitral valve was estimated 14 mmHg with mitral valve area calculated 0.85 cm. A definite source of the embolus was not identified and to the patient's recollection he did not undergo TEE. He was started on Eliquis at that time. After his surgical procedure he developed severe problems with bladder outlet obstruction for which he ultimately underwent transurethral laser prostatectomy in early June. Ever since the patient has had problems with progressive symptoms ofexertional shortness of breath. He was seen in follow-up by Dr. Rhona Leavenshiu and underwent left and right heart catheterization on 07/10/2016. He was found to have mild nonobstructive coronary artery disease. Previously placed stents in the right coronary artery and the left circumflex coronary artery were widely patent. There  was severe pulmonary hypertension with PA pressures measured 72/26 and mean pulmonary  capillary wedge pressure 26 mmHg. Mean transvalvular gradient across the mitral valve was estimated 20 mmHg. The patient was referred for surgical consultation.  The patient is married and lives locally in [place] with his wife. He retired from [employer] business approximately one year ago. [place] amount of time there. The patient has remained physically active during his retirement until recently when he has been limited by exertional shortness of breath. He states that he now gets short of breath with mild to moderate activity in this limits his activity to a significant degree. He denies any history of resting shortness of breath, PND, orthopnea, or lower extremity edema. He has not had any chest pain or chest tightness either with activity or at rest. He has not had any palpitations nor other symptoms to suggest a recurrence of atrial fibrillation. He states that while he was in the hospital recently he has always been in sinus rhythm. He has not had any bleeding problems since he started taking Eliquis.  Patient was originally seen in consultation on 07/16/2016. We had previously made plans for redo mitral valve replacement last month but when the patient presented for follow-up on 08/31/2016 he was found to be in acute decompensated congestive heart failure with rapid atrial flutter and severe anemia. He was hospitalized and started on amiodarone. Anticoagulation was held and he underwent EGD revealing erosive gastritis and colonoscopy revealing 5 bleeding colonic AVMs. The AVMs were treated with APC andclipping. Anticoagulation was restarted after hemoglobin stabilized and he ultimately underwent TEE guided cardioversion back into sinus rhythm. TEE confirmed the presence of severe mitral stenosis with mean transvalvular gradient measured 14 mmHg. He was also treated with ciprofloxacin for urinary tract infection. He was discharged home and seen in  follow-up yesterday by Dr. Shirlee Latch. The patient remained in sinus rhythm and hemoglobin had gone up to 10.0. He returns to our office today with hopes to proceed with redo mitral valve replacement in the near future. The patient reports that he has been doing well since hospital discharge. He has gradually improved in strength. He still gets short of breath with activity, but he is doing much better than he was at the time of his last presentation in early October. He denies any fevers, chills, or productive cough. He has not seen any blood in his stool. He has no symptoms of dysuria or hematuria.  The patient and his wife wereagain counseled at length regarding the indications, risks and potential benefits of redo mitral valve replacement. The rationale for elective surgery has been explained, including a comparison between surgery, balloon mitral valvuloplasty,and continued medical therapy with close follow-up. We discussed the possibility of replacing the mitral valve using a mechanical prosthesis with the attendant need for long-term anticoagulation versus the alternative of replacing it using a bioprosthetic tissue valve with its potential for late structural valve deterioration and failure, depending upon the patient's longevity. The patient specifically requests that hismitral valve be replaced using a bioprosthetic tissuevalve. The patient understands and accepts all potential risks of surgery. He was admitted on 11/02/201 in order to undergo a redo sternotomy for mitral valve replacement, tricuspid valve repair.  Brief Hospital Course:  The patient was extubated later the evening of surgery without difficulty. He remained afebrile and hemodynamically stable. He was gradually weaned off of Neo Synephrine and Milrinone drips. His initial rhythm is  junctional and he was AV paced. Theone MurdochSwan Ganz, a line, chest tubes, and foley were removed early in the post operative course. He had ABL anemia. He did   require a post op transfusion. He was started on KiribatiFergona and Foltx.Last H and H was 9 and 28.6. He was weaned off the Insulin drip. The patient's HGA1C pre op was 4.8.  He was volume over loaded and diuresed accordingly. He was started on low dose Amiodarone. He was also started on Coumadin. Daily PT and INR were obtained. He is currently on 5 mg of Coumadin and his INR is up to 1.67.  Per Dr. Cornelius Moraswen, will discharge on Coumadin 2.5 mg daily. He will need to call Dr. Priscille KluverJenyung Chiu's office in [facility] (his cardiologist) to have a PT and INR drawn on Monday 10/12/2016. The patient was felt surgically stable for transfer from the ICU to PCTU for further convalescence on 10/05/2016. He continues to progress with cardiac rehab. He was ambulating on room air. He has been tolerating a diet and has had a bowel movement. Epicardial pacing wires were removed on 10/07/2016. Chest tube sutures were removed today. The patient is felt surgically stable for discharge today.   Latest Vital Signs: Blood pressure 116/67, pulse 73, temperature 97.3 F (36.3 C), temperature source Oral, resp. rate 20, height 5\' 11"  (1.803 m), weight 156 lb 4.9 oz (70.9 kg), SpO2 100 %.  Physical Exam: Cardiovascular: RRR Pulmonary: Some rales Abdomen: Soft, non tender, bowel sounds present. Extremities: Mild bilateral lower extremity edema. Wounds: Clean and dry.  No erythema or signs of infection.   Discharge Condition: Stable and discharged to home.  Recent laboratory studies:  Lab Results  Component Value Date   WBC 4.6 10/06/2016   HGB 9.0 (L) 10/06/2016   HCT 28.6 (L) 10/06/2016   MCV 79.7 10/06/2016   PLT 139 (L) 10/06/2016   Lab Results  Component Value Date   NA 135 10/06/2016   K 3.5 10/06/2016   CL 98 (L) 10/06/2016   CO2 27 10/06/2016   CREATININE 0.78 10/06/2016   GLUCOSE 80 10/06/2016    Diagnostic Studies:   Dg Chest Port 1 View  Result Date: 10/05/2016 CLINICAL DATA:  Status post mitral and  tricuspid valve replacement. EXAM: PORTABLE CHEST 1 VIEW COMPARISON:  Portable chest x-ray of October 04, 2016 FINDINGS: The left lung is well-expanded. There is some volume loss on the right. There is significant subcutaneous emphysema on the right. No definite right-sided pneumothorax or pneumomediastinum is observed. The right chest tube tip projects in the medial cardiophrenic angle. The left chest tube projects peripherally in the mid lung at approximately the seventh rib. There is no pneumothorax. The cardiac silhouette is enlarged. The prosthetic valve rings are visible. The pulmonary vascularity is indistinct. The pulmonary interstitial markings are increased diffusely but are fairly stable. A small left pleural effusion is suspected. The right internal jugular Cordis sheath tip projects over the proximal SVC. IMPRESSION: Fairly stable appearance of the chest since yesterday's study. Persistent pulmonary interstitial edema. The chest tubes are in stable position without evidence of a pneumothorax. A large amount of subcutaneous emphysema is present in the right axillary region and right neck base. Electronically Signed   By: David  SwazilandJordan M.D.   On: 10/05/2016 07:27   Discharge Medications:   Medication List    STOP taking these medications   ELIQUIS 5 MG Tabs tablet Generic drug:  apixaban   enoxaparin 80 MG/0.8ML injection Commonly known as:  LOVENOX     TAKE these medications   amiodarone 200 MG tablet Commonly known as:  PACERONE Take 1 tablet (200 mg total) by mouth daily.   aspirin 81 MG EC tablet Take 1 tablet (81 mg total) by mouth daily.   atorvastatin 20 MG tablet Commonly known as:  LIPITOR Take 20 mg by mouth daily at 6 PM.   budesonide-formoterol 80-4.5 MCG/ACT inhaler Commonly known as:  SYMBICORT Inhale 2 puffs into the lungs 2 (two) times daily.   CALCIUM 600/VITAMIN D 600-400 MG-UNIT Tabs Generic drug:  Calcium Carbonate-Vitamin D3 Take 1 tablet by mouth  daily.   ferrous gluconate 324 MG tablet Commonly known as:  FERGON Take 1 tablet (324 mg total) by mouth daily with breakfast. For one month then stop.   folic acid-pyridoxine-cyancobalamin 2.5-25-2 MG Tabs tablet Commonly known as:  FOLTX Take 1 tablet by mouth daily. For one month then stop.   furosemide 40 MG tablet Commonly known as:  LASIX Take 40 mg by mouth in the am and 40 mg by mouth in the pm What changed:  medication strength  additional instructions   pantoprazole 40 MG tablet Commonly known as:  PROTONIX Take 1 tablet (40 mg total) by mouth 2 (two) times daily. TAKE AT 0600   Potassium Chloride ER 20 MEQ Tbcr Commonly known as:  KLOR-CON 10 Take 20 mEq by mouth 2 (two) times daily. What changed:  medication strength  when to take this   sildenafil 100 MG tablet Commonly known as:  VIAGRA Take 100 mg by mouth daily as needed for erectile dysfunction.   SPIRIVA HANDIHALER 18 MCG inhalation capsule Generic drug:  tiotropium Place 18 mcg into inhaler and inhale daily.   traMADol 50 MG tablet Commonly known as:  ULTRAM Take 50 mg by mouth every 4-6 hours PRN moderate to severe pain.   warfarin 2.5 MG tablet Commonly known as:  COUMADIN Take 1 tablet (2.5 mg total) by mouth daily at 6 PM. Or as directed.     The patient has been discharged on:   1.Beta Blocker:  Yes [   ]                              No   [x   ]                              If No, reason:Previous bradycardia, first degree heart block.  2.Ace Inhibitor/ARB: Yes [   ]                                     No  [   x ]                                     If No, reason:Labile BP;will try to start as outpatient  3.Statin:   Yes [   ]                  No  [ x  ]                  If No, reason:No CAD  4.Marlowe KaysValentino Hue  [ x ]  No   [   ]                  If No, reason:  Follow Up Appointments: Follow-up Information    Purcell Nails, MD Follow up on 11/09/2016.     Specialty:  Cardiothoracic Surgery Why:  PA/LAT CXR to be taken (at [facility] which is in the same building as Dr. Orvan July office) on 11/09/2016 at 10:30 am ;Appointment time is 11:00 am Contact information: [address] [place] [address] [phone]        Holley Raring, MD Follow up.   Specialty:  Cardiology Why:  Call to have PT and INR (on Coumadin, INR goal 2-2.5) drawn on Monday 10/12/2016. Also,call for a follow up appointment. Please follow up at cardiologist's office as seen prior to surgery. Contact information: [address] [place] [address] [phone]           Signed: Doree Fudge MPA-C 10/08/2016, 3:41 PM

## 2016-10-06 NOTE — Progress Notes (Signed)
CARDIAC REHAB PHASE I   PRE:  Rate/Rhythm: 77 SR  BP:  Sitting: 115/70        SaO2: 95 RA  MODE:  Ambulation: 150 ft   POST:  Rate/Rhythm: 98   BP:  Sitting: 114/62         SaO2: 88 RA, 91 after 2 minutes rest  Pt stood with minimal assistance, did have initial loss of balance, improved with distance. Pt ambulated 150 ft on RA, rolling walker, assist x1, fairly steady gait, tolerated fairly well. Pt c/o DOE, general fatigue, declined to ambulate farther, requested to return to room. Pt sats 88% on RA upon return to room, increased to 91% on RA after 2 minutes rest. Encouraged and practiced IS use, additional ambulation x2 today. Pt to recliner after walk, feet elevated, call bell within reach. Will follow.   [id number] Joylene GrapesEmily C Aaliyana Fredericks, RN, BSN 10/06/2016 11:43 AM

## 2016-10-06 NOTE — Progress Notes (Addendum)
      [address]       [place] [id number]             [phone]        5 Days Post-Op Procedure(s) (LRB): REDO MITRAL VALVE REPLACEMENT (MVR) implanted with Magna Mitral Ease size 29mm (N/A) TRICUSPID VALVE REPAIR with [employer] 3 size 28 (N/A) TRANSESOPHAGEAL ECHOCARDIOGRAM (TEE) (N/A)  Subjective: Patient about to eat breakfast. No bowel movement yet.  Objective: Vital signs in last 24 hours: Temp:  [98.4 F (36.9 C)-99 F (37.2 C)] 98.4 F (36.9 C) (11/07 0338) Pulse Rate:  [70-92] 80 (11/07 0338) Cardiac Rhythm: Heart block (11/06 2024) Resp:  [18-29] 18 (11/07 0338) BP: (95-141)/(59-83) 109/67 (11/07 0338) SpO2:  [86 %-100 %] 94 % (11/07 0338) Weight:  [158 lb 8.2 oz (71.9 kg)] 158 lb 8.2 oz (71.9 kg) (11/07 0338)  Pre op weight 67.6 kg Current Weight  10/06/16 158 lb 8.2 oz (71.9 kg)       Intake/Output from previous day: 11/06 0701 - 11/07 0700 In: 153.3 [I.V.:3.3; IV Piggyback:150] Out: 2090 [Urine:2090]   Physical Exam:  Cardiovascular: RRR Pulmonary: Some rales Abdomen: Soft, non tender, bowel sounds present. Extremities: Mild bilateral lower extremity edema. Wounds: Clean and dry.  No erythema or signs of infection.  Lab Results: CBC: Recent Labs  10/05/16 0417 10/06/16 0322  WBC 4.4 4.6  HGB 8.4* 9.0*  HCT 26.5* 28.6*  PLT 108* 139*   BMET:  Recent Labs  10/05/16 0417 10/06/16 0322  NA 133* 135  K 3.7 3.5  CL 101 98*  CO2 27 27  GLUCOSE 98 80  BUN 18 15  CREATININE 0.83 0.78  CALCIUM 8.1* 8.5*    PT/INR:  Lab Results  Component Value Date   INR 1.21 10/06/2016   INR 1.34 10/05/2016   INR 1.33 10/04/2016   ABG:  INR: Will add last result for INR, ABG once components are confirmed Will add last 4 CBG results once components are confirmed  Assessment/Plan:  1. CV - First degree heart block, PVCs. On Amiodarone 200 mg daily. INR slightly decreased from 1.34 to 1.21. Will increase Coumadin to 5 mg  daily. 2.  Pulmonary - History of COPD. Continue Dulera and Spiriva. On 2 liters of oxygen via [place]. Wean to room air as tolerates. Encourage incentive spirometer. 3. Chronic diastolic CHF, volume overload-On Lasix 40 mg IV bid 4.  Acute blood loss anemia - H and H stable at 9 and 28.6. Previous transfusion.Continue Foltx and [employer] CommunicationsFergon. 5. Supplement potassium 6. Mild Thrombocytopenia-platelet count up to 139,000 7. LOC constipation 8. Remove EPW in am  ZIMMERMAN,DONIELLE MPA-C 10/06/2016,8:04 AM   I have seen and examined the patient and agree with the assessment and plan as outlined. D/C pacing wires and increase coumadin dose.  [name], MD 10/06/2016

## 2016-10-07 LAB — PROTIME-INR
INR: 1.34
PROTHROMBIN TIME: 16.7 s — AB (ref 11.4–15.2)

## 2016-10-07 MED ORDER — FUROSEMIDE 40 MG PO TABS
40.0000 mg | ORAL_TABLET | Freq: Two times a day (BID) | ORAL | Status: DC
  Administered 2016-10-07 (×2): 40 mg via ORAL
  Filled 2016-10-07 (×2): qty 1

## 2016-10-07 MED ORDER — POTASSIUM CHLORIDE CRYS ER 20 MEQ PO TBCR: 20.0000 meq | EXTENDED_RELEASE_TABLET | Freq: Every day | ORAL | Status: DC

## 2016-10-07 MED ORDER — ASPIRIN 81 MG PO TBEC: 81.0000 mg | DELAYED_RELEASE_TABLET | Freq: Every day | ORAL | Status: AC

## 2016-10-07 MED ORDER — AMIODARONE HCL 200 MG PO TABS: 200.0000 mg | ORAL_TABLET | Freq: Every day | ORAL | 1 refills | Status: AC

## 2016-10-07 MED ORDER — FUROSEMIDE 40 MG PO TABS: 40.0000 mg | ORAL_TABLET | Freq: Every day | ORAL | Status: DC

## 2016-10-07 MED ORDER — FA-PYRIDOXINE-CYANOCOBALAMIN 2.5-25-2 MG PO TABS: 1.0000 | ORAL_TABLET | Freq: Every day | ORAL | 0 refills | Status: DC

## 2016-10-07 MED ORDER — FERROUS GLUCONATE 324 (38 FE) MG PO TABS: 324.0000 mg | ORAL_TABLET | Freq: Every day | ORAL | 0 refills | Status: DC

## 2016-10-07 MED ORDER — POTASSIUM CHLORIDE CRYS ER 20 MEQ PO TBCR
20.0000 meq | EXTENDED_RELEASE_TABLET | Freq: Two times a day (BID) | ORAL | Status: DC
  Administered 2016-10-07 (×2): 20 meq via ORAL
  Filled 2016-10-07 (×2): qty 1

## 2016-10-07 NOTE — Progress Notes (Addendum)
      [address]       [place] [id number]             [phone]        6 Days Post-Op Procedure(s) (LRB): REDO MITRAL VALVE REPLACEMENT (MVR) implanted with Magna Mitral Ease size 29mm (N/A) TRICUSPID VALVE REPAIR with [employer] 3 size 28 (N/A) TRANSESOPHAGEAL ECHOCARDIOGRAM (TEE) (N/A)  Subjective: Patient had bowel movement yesterday. He is about to eat breakfast today.  Objective: Vital signs in last 24 hours: Temp:  [98 F (36.7 C)-98.4 F (36.9 C)] 98 F (36.7 C) (11/08 0348) Pulse Rate:  [72-84] 77 (11/08 0348) Cardiac Rhythm: Normal sinus rhythm;Heart block (11/07 1950) Resp:  [17] 17 (11/07 1300) BP: (99-125)/(51-73) 125/71 (11/08 0348) SpO2:  [90 %-97 %] 90 % (11/08 0743) Weight:  [156 lb 4.8 oz (70.9 kg)] 156 lb 4.8 oz (70.9 kg) (11/08 0348)  Pre op weight 67.6 kg Current Weight  10/07/16 156 lb 4.8 oz (70.9 kg)       Intake/Output from previous day: 11/07 0701 - 11/08 0700 In: 480 [P.O.:480] Out: 801 [Urine:800; Stool:1]   Physical Exam:  Cardiovascular: RRR Pulmonary: Slightly diminished at bases Abdomen: Soft, non tender, bowel sounds present. Extremities: Mild bilateral lower extremity edema. Wounds: Clean and dry.  No erythema or signs of infection.  Lab Results: CBC:  Recent Labs  10/05/16 0417 10/06/16 0322  WBC 4.4 4.6  HGB 8.4* 9.0*  HCT 26.5* 28.6*  PLT 108* 139*   BMET:   Recent Labs  10/05/16 0417 10/06/16 0322  NA 133* 135  K 3.7 3.5  CL 101 98*  CO2 27 27  GLUCOSE 98 80  BUN 18 15  CREATININE 0.83 0.78  CALCIUM 8.1* 8.5*    PT/INR:  Lab Results  Component Value Date   INR 1.34 10/07/2016   INR 1.21 10/06/2016   INR 1.34 10/05/2016   ABG:  INR: Will add last result for INR, ABG once components are confirmed Will add last 4 CBG results once components are confirmed  Assessment/Plan:  1. CV - First degree heart block, PVCs. On Amiodarone 200 mg daily. INR slightly increased from  1.21 to 1.34. Continue Coumadin to 5 mg daily. 2.  Pulmonary - History of COPD. Continue Dulera and Spiriva. Was on 2 liters of oxygen via [place] but weaned to room air. Encourage incentive spirometer. 3. Chronic diastolic CHF, volume overload-Was not given IV Lasix last night because lost IV access. Will give orally bid today as discussed with Dr. Cornelius Moraswen. 4.  Acute blood loss anemia - Last H and H stable at 9 and 28.6. Previous transfusion.Continue Foltx and [employer]Fergon. 5. Mild Thrombocytopenia-platelet count up to 139,000 6. Possible discharge in am  ZIMMERMAN,DONIELLE MPA-C 10/07/2016,8:01 AM    I have seen and examined the patient and agree with the assessment and plan as outlined.  I had a long talk with patient regarding unfortunate matters that have developed with his family at home.  Hope to d/c home tomorrow.  Check f/[facility] IN[facility] next Monday.  [name], MD 10/07/2016

## 2016-10-07 NOTE — Progress Notes (Signed)
CARDIAC REHAB PHASE I   Spoke with RN, do not disturb sign on pt's door. Per RN, pt declines ambulation today. Will follow-up tomorrow.   Joylene GrapesEmily C Giovanne Nickolson, RN, BSN 10/07/2016 12:07 PM

## 2016-10-07 NOTE — Progress Notes (Addendum)
Discussed with PA Donielle wife's concern for patient concerning death of son last night. Wife and niece at bedside with patient and will monitor and call for assistance. Thomas HoffBurton, Jamieka Royle McClintock, RN

## 2016-10-08 LAB — PROTIME-INR
INR: 1.67
Prothrombin Time: 19.9 seconds — ABNORMAL HIGH (ref 11.4–15.2)

## 2016-10-08 MED ORDER — FUROSEMIDE 40 MG PO TABS: ORAL_TABLET | ORAL | 1 refills | Status: AC

## 2016-10-08 MED ORDER — POTASSIUM CHLORIDE CRYS ER 20 MEQ PO TBCR
20.0000 meq | EXTENDED_RELEASE_TABLET | Freq: Once | ORAL | Status: AC
Start: 2016-10-08 — End: 2016-10-08
  Administered 2016-10-08: 20 meq via ORAL
  Filled 2016-10-08: qty 1

## 2016-10-08 MED ORDER — POTASSIUM CHLORIDE ER 20 MEQ PO TBCR: 20.0000 meq | EXTENDED_RELEASE_TABLET | Freq: Two times a day (BID) | ORAL | 1 refills | Status: AC

## 2016-10-08 MED ORDER — TRAMADOL HCL 50 MG PO TABS: ORAL_TABLET | ORAL | 0 refills | Status: DC

## 2016-10-08 MED ORDER — FUROSEMIDE 10 MG/ML IJ SOLN
40.0000 mg | Freq: Once | INTRAMUSCULAR | Status: AC
  Administered 2016-10-08: 40 mg via INTRAVENOUS
  Filled 2016-10-08: qty 4

## 2016-10-08 MED ORDER — WARFARIN SODIUM 2.5 MG PO TABS: 2.5000 mg | ORAL_TABLET | Freq: Every day | ORAL | Status: DC

## 2016-10-08 MED ORDER — WARFARIN SODIUM 2.5 MG PO TABS: 2.5000 mg | ORAL_TABLET | Freq: Every day | ORAL | 1 refills | Status: DC

## 2016-10-08 NOTE — Progress Notes (Signed)
CARDIAC REHAB PHASE I   Pt awaiting discharge. Cardiac surgery discharge education completed with pt. Reviewed IS, sternal precautions, activity progression, exercise, heart healthy diet, sodium restrictions, daily weights, and phase 2 cardiac rehab. Pt verbalized understanding. Pt agrees to phase 2 cardiac rehab referral, will send [facility] per pt request. Pt in recliner, call bell within reach.   [phone] Joylene GrapesEmily C Ruel Dimmick, RN, BSN 10/08/2016 10:31 AM

## 2016-10-08 NOTE — Care Management Important Message (Signed)
Important Message  Patient Details  Name: Carlos Norton L Lesmeister MRN: [id number] Date of Birth: 05-Oct-1941   Medicare Important Message Given:  Yes    Kyla BalzarineShealy, Casen Pryor Abena 10/08/2016, 9:33 AM

## 2016-10-08 NOTE — Progress Notes (Addendum)
      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 He needs to go home to take care of family matters after the death of his son.  Objective: Vital signs in last 24 hours: Temp:  [97.3 F (36.3 C)-98.1 F (36.7 C)] 97.3 F (36.3 C) (11/09 0425) Pulse Rate:  [73-80] 73 (11/09 0425) Cardiac Rhythm: Atrial fibrillation (11/08 1900) Resp:  [20] 20 (11/09 0425) BP: (109-116)/(67-73) 116/67 (11/09 0425) SpO2:  [90 %-100 %] 100 % (11/09 0425) Weight:  [156 lb 4.9 oz (70.9 kg)] 156 lb 4.9 oz (70.9 kg) (11/09 0425)  Pre op weight 67.6 kg Current Weight  10/08/16 156 lb 4.9 oz (70.9 kg)       Intake/Output from previous day: 11/08 0701 - 11/09 0700 In: 240 [P.O.:240] Out: 625 [Urine:625]   Physical Exam:  Cardiovascular: RRR Pulmonary: Slightly diminished at bases Abdomen: Soft, non tender, bowel sounds present. Extremities: Bilateral lower extremity edema. Wounds: Clean and dry.  No erythema or signs of infection.  Lab Results: CBC:  Recent Labs  10/06/16 0322  WBC 4.6  HGB 9.0*  HCT 28.6*  PLT 139*   BMET:   Recent Labs  10/06/16 0322  NA 135  K 3.5  CL 98*  CO2 27  GLUCOSE 80  BUN 15  CREATININE 0.78  CALCIUM 8.5*    PT/INR:  Lab Results  Component Value Date   INR 1.67 10/08/2016   INR 1.34 10/07/2016   INR 1.21 10/06/2016   ABG:  INR: Will add last result for INR, ABG once components are confirmed Will add last 4 CBG results once components are confirmed  Assessment/Plan:  1. CV - First degree heart block, PVCs. On Amiodarone 200 mg daily. INR slightly increased from 1.34 to 1.67. Will send on Coumadin 2.5. 2.  Pulmonary -  History of COPD. Continue Dulera and Spiriva. On  room air. Encourage incentive spirometer. 3. Chronic diastolic CHF, volume overload-As discussed with Dr. Cornelius Moraswen, will continue Lasix 40 mg bid at discharge. Will give him IV Lasix this am 4.  Acute blood loss anemia - Last H and H stable at 9 and 28.6. Previous transfusion.Continue Foltx and [employer]Fergon. 5. Mild Thrombocytopenia-last platelet count up to 139,000 6. Discharge   ZIMMERMAN,DONIELLE MPA-C 10/08/2016,7:17 AM    I have seen and examined the patient and agree with the assessment and plan as outlined.  [name], MD 10/08/2016 9:49 AM

## 2016-10-08 NOTE — Care Management Note (Signed)
Case Management Note Previous CM note initiated by Cherylann Parrlaxton, Samantha S, RN 10/02/2016, 10:47 AM   Patient Details  Name: Carlos Norton L [place] MRN: [id number] Date of Birth: [birth year]  Subjective/Objective:   S/p MVR                 Action/Plan:  PTA from home with wife.  CM will continue to follow for discharge needs   Expected Discharge Date:     10/08/16             Expected Discharge Plan:  Home/Self Care  In-House Referral:     Discharge planning Services  CM Consult  Post Acute Care Choice:    Choice offered to:     DME Arranged:    DME Agency:     HH Arranged:    HH Agency:     Status of Service:  Completed, signed off  If discussed at Long Length of Stay Meetings, dates discussed:  10/08/16  Discharge Disposition: home/self care   Additional Comments:  10/08/16- 1020- Asley Baskerville Rn CM- pt for d/c home today with wife- no CM needs noted.   Darrold SpanWebster, Mera Gunkel Hall, RN 10/08/2016, 10:28 AM

## 2016-10-08 NOTE — Progress Notes (Signed)
Carlos CohenEdward L Norton to be D/C'd Home per MD order. Discussed with the patient and all questions fully answered.    Medication List    STOP taking these medications   ELIQUIS 5 MG Tabs tablet Generic drug:  apixaban   enoxaparin 80 MG/0.8ML injection Commonly known as:  LOVENOX     TAKE these medications   amiodarone 200 MG tablet Commonly known as:  PACERONE Take 1 tablet (200 mg total) by mouth daily.   aspirin 81 MG EC tablet Take 1 tablet (81 mg total) by mouth daily.   atorvastatin 20 MG tablet Commonly known as:  LIPITOR Take 20 mg by mouth daily at 6 PM.   budesonide-formoterol 80-4.5 MCG/ACT inhaler Commonly known as:  SYMBICORT Inhale 2 puffs into the lungs 2 (two) times daily.   CALCIUM 600/VITAMIN D 600-400 MG-UNIT Tabs Generic drug:  Calcium Carbonate-Vitamin D3 Take 1 tablet by mouth daily.   ferrous gluconate 324 MG tablet Commonly known as:  FERGON Take 1 tablet (324 mg total) by mouth daily with breakfast. For one month then stop.   folic acid-pyridoxine-cyancobalamin 2.5-25-2 MG Tabs tablet Commonly known as:  FOLTX Take 1 tablet by mouth daily. For one month then stop.   furosemide 40 MG tablet Commonly known as:  LASIX Take 40 mg by mouth in the am and 40 mg by mouth in the pm What changed:  medication strength  additional instructions   pantoprazole 40 MG tablet Commonly known as:  PROTONIX Take 1 tablet (40 mg total) by mouth 2 (two) times daily. TAKE AT 0600   Potassium Chloride ER 20 MEQ Tbcr Commonly known as:  KLOR-CON 10 Take 20 mEq by mouth 2 (two) times daily. What changed:  medication strength  when to take this   sildenafil 100 MG tablet Commonly known as:  VIAGRA Take 100 mg by mouth daily as needed for erectile dysfunction.   SPIRIVA HANDIHALER 18 MCG inhalation capsule Generic drug:  tiotropium Place 18 mcg into inhaler and inhale daily.   traMADol 50 MG tablet Commonly known as:  ULTRAM Take 50 mg by mouth every 4-6  hours PRN moderate to severe pain.   warfarin 2.5 MG tablet Commonly known as:  COUMADIN Take 1 tablet (2.5 mg total) by mouth daily at 6 PM. Or as directed.       VVS, Skin clean, dry and intact without evidence of skin break down, no evidence of skin tears noted.  IV catheter discontinued intact. Site without signs and symptoms of complications. Dressing and pressure applied.  An After Visit Summary was printed and given to the patient.  Patient escorted via WC, and D/C home via private auto.  Carlos LevinsJacobs, Carlos Norton N  10/08/2016 12:38 PM

## 2016-10-24 IMAGING — CR DG CHEST 1V PORT
1 series · 1 of 1 positions shown · non-contrast
Comparison: 04/09/2016

CLINICAL DATA: Atrial fibrillation, dyspnea

EXAM:
PORTABLE CHEST 1 VIEW

[AP]
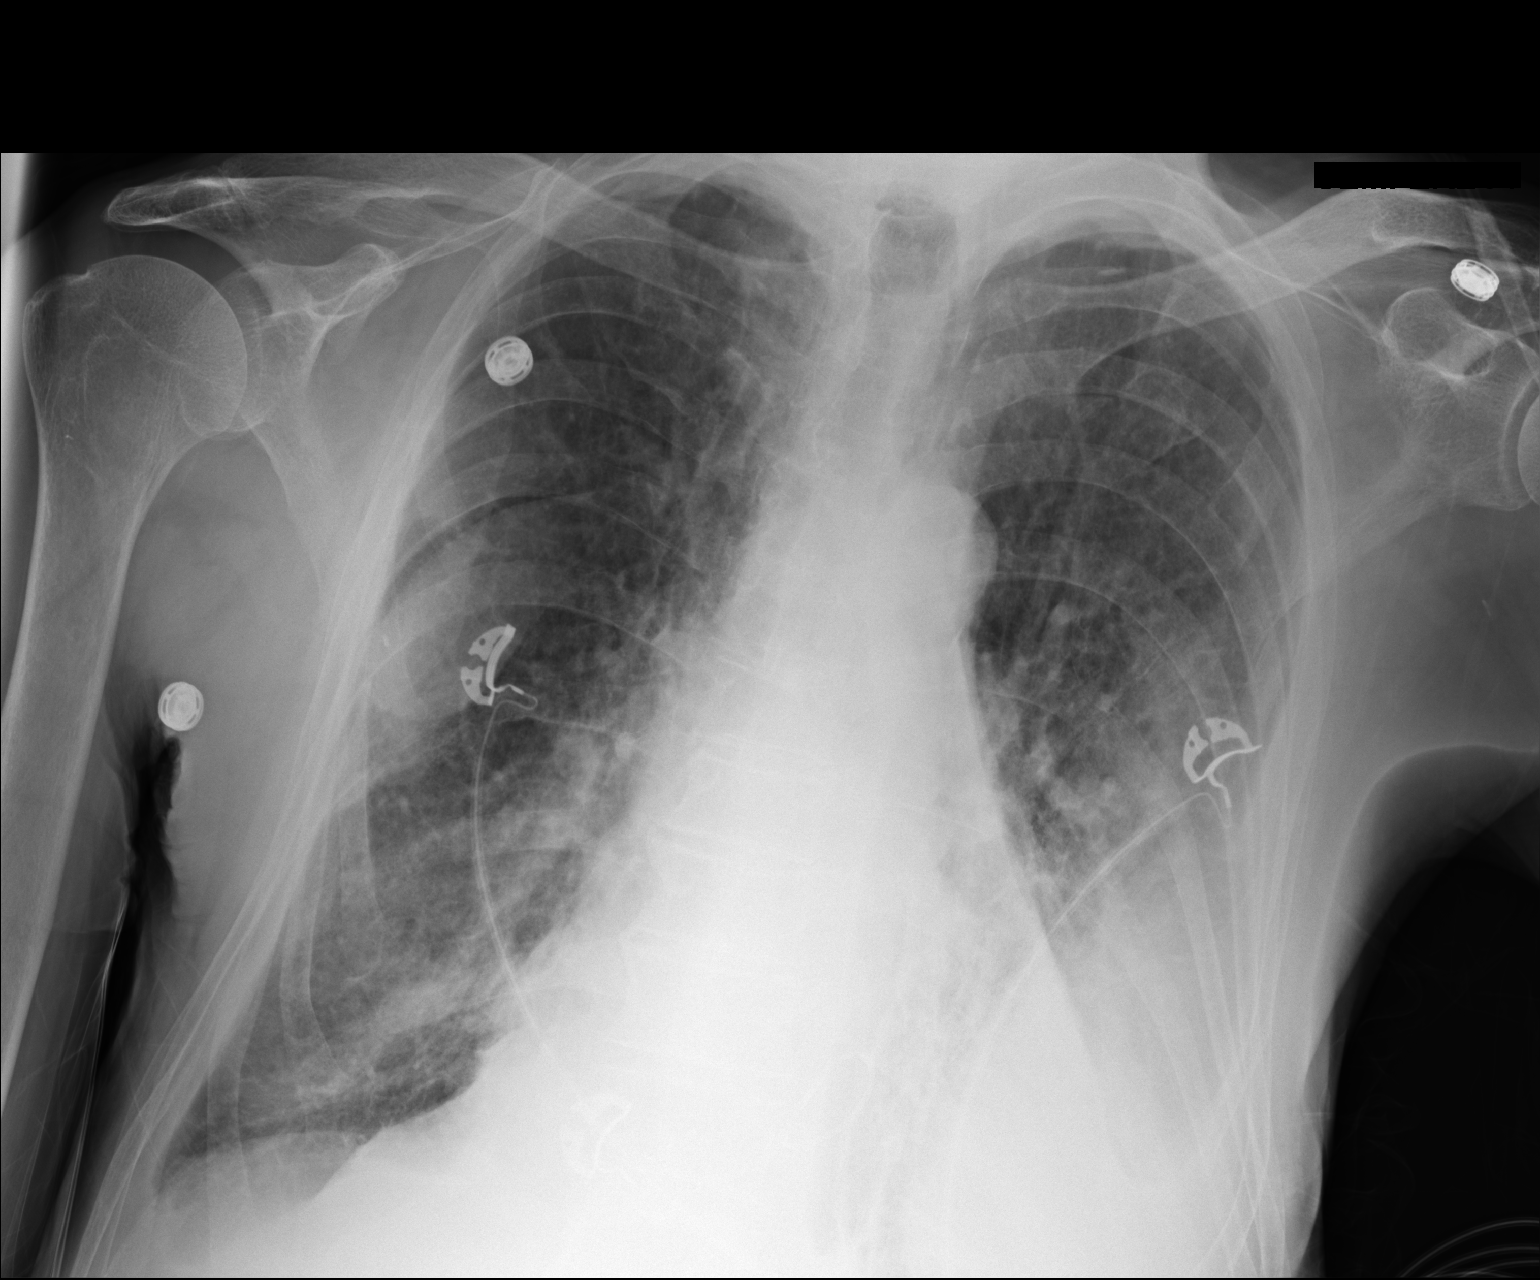

[1 of 1 positions shown; findings below may reference images not displayed]

FINDINGS: There is cardiomegaly with pulmonary vascular redistribution
consistent with CHF. Opacities are noted at the left lung base and
periphery of the left lower thorax consistent with moderate layering
effusion. Ovoid density in in the right mid thorax may represent
fluid in the major fissure. Aortic atherosclerosis without aneurysm.
No acute osseous abnormality.
IMPRESSION: Cardiomegaly with CHF and bilateral pleural effusions left greater
than right.

## 2016-10-31 IMAGING — CR DG CHEST 2V
2 series · 2 of 2 positions shown · non-contrast
Comparison: 09/01/2016

CLINICAL DATA: Cough and shortness of Breath

EXAM:
CHEST  2 VIEW

[chest pa]
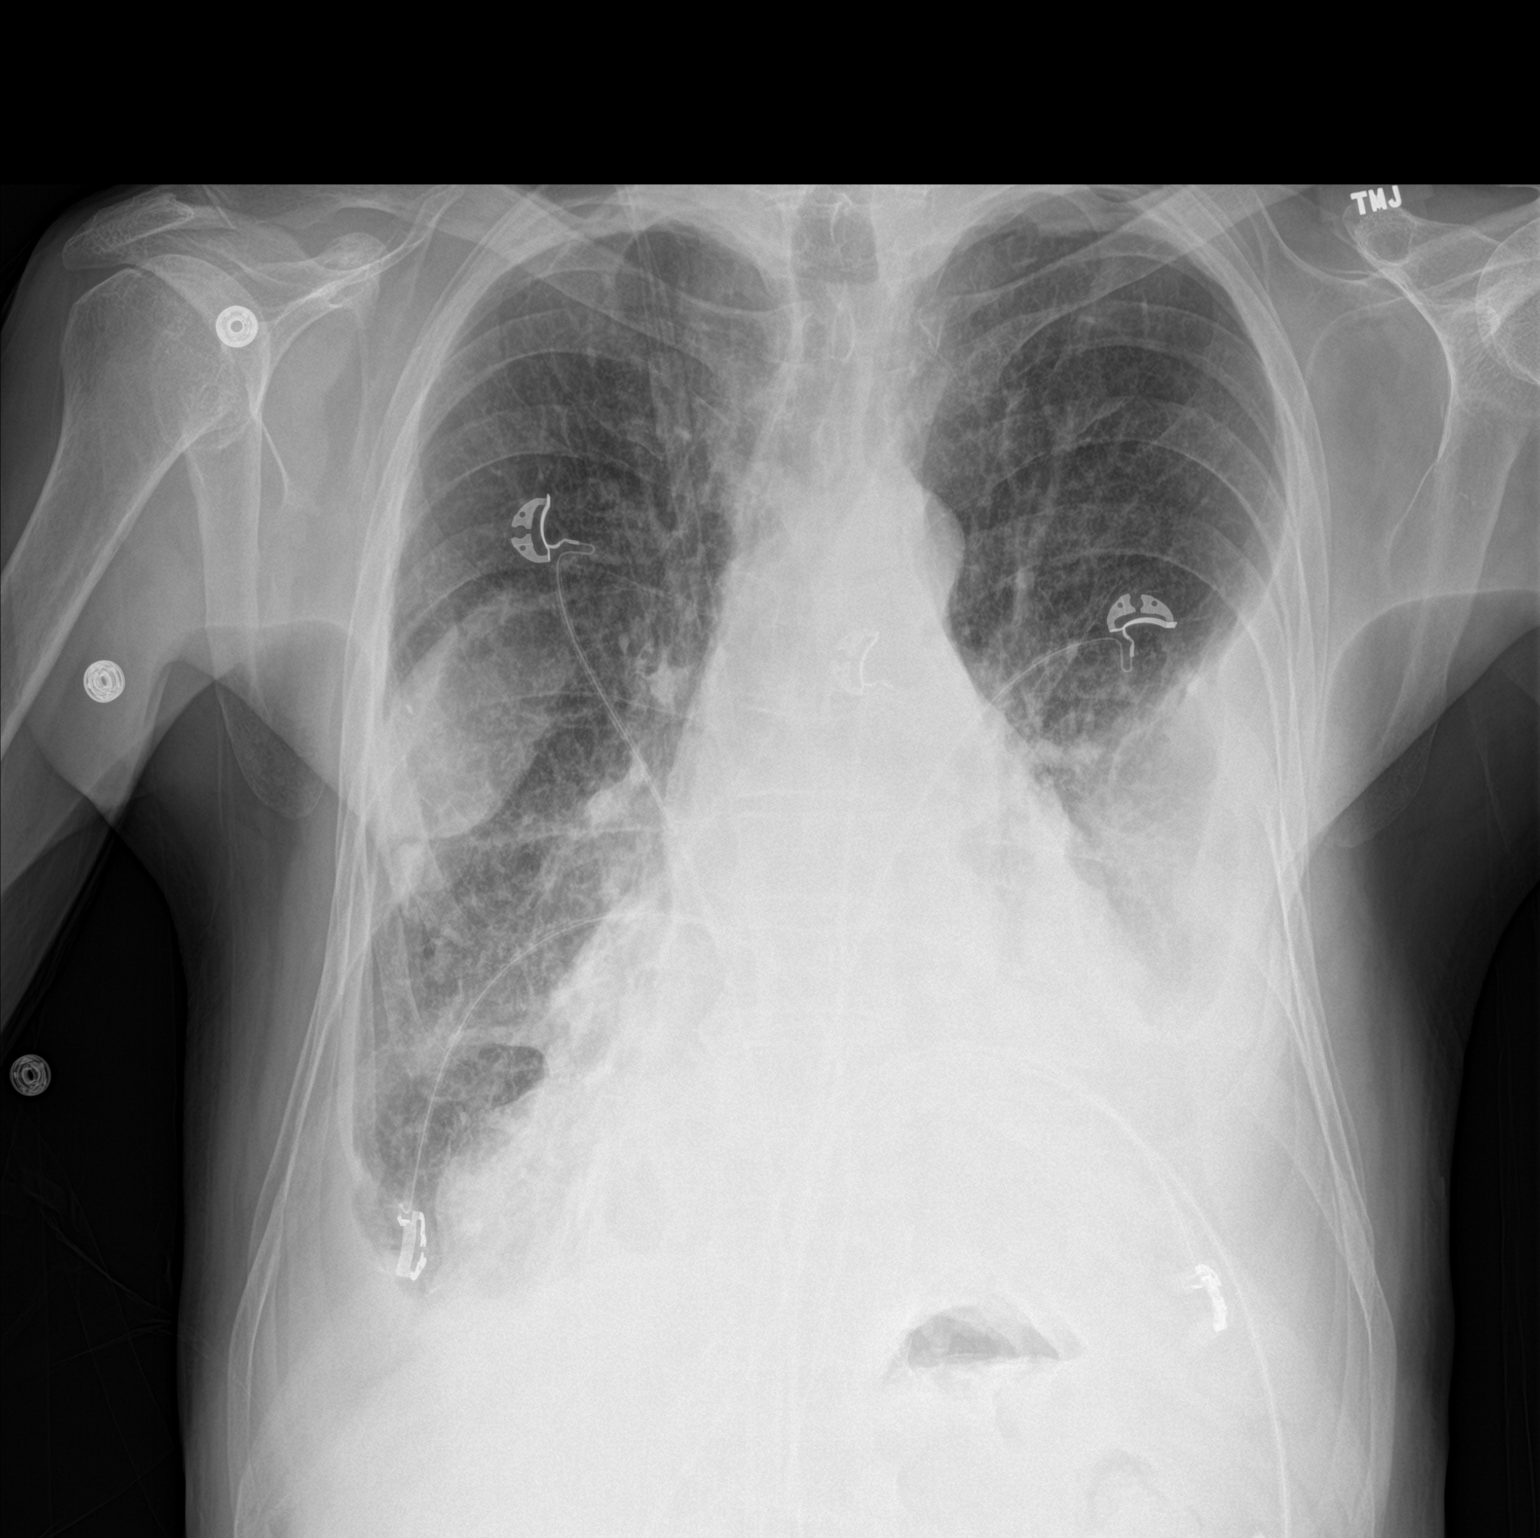

[chest lat]
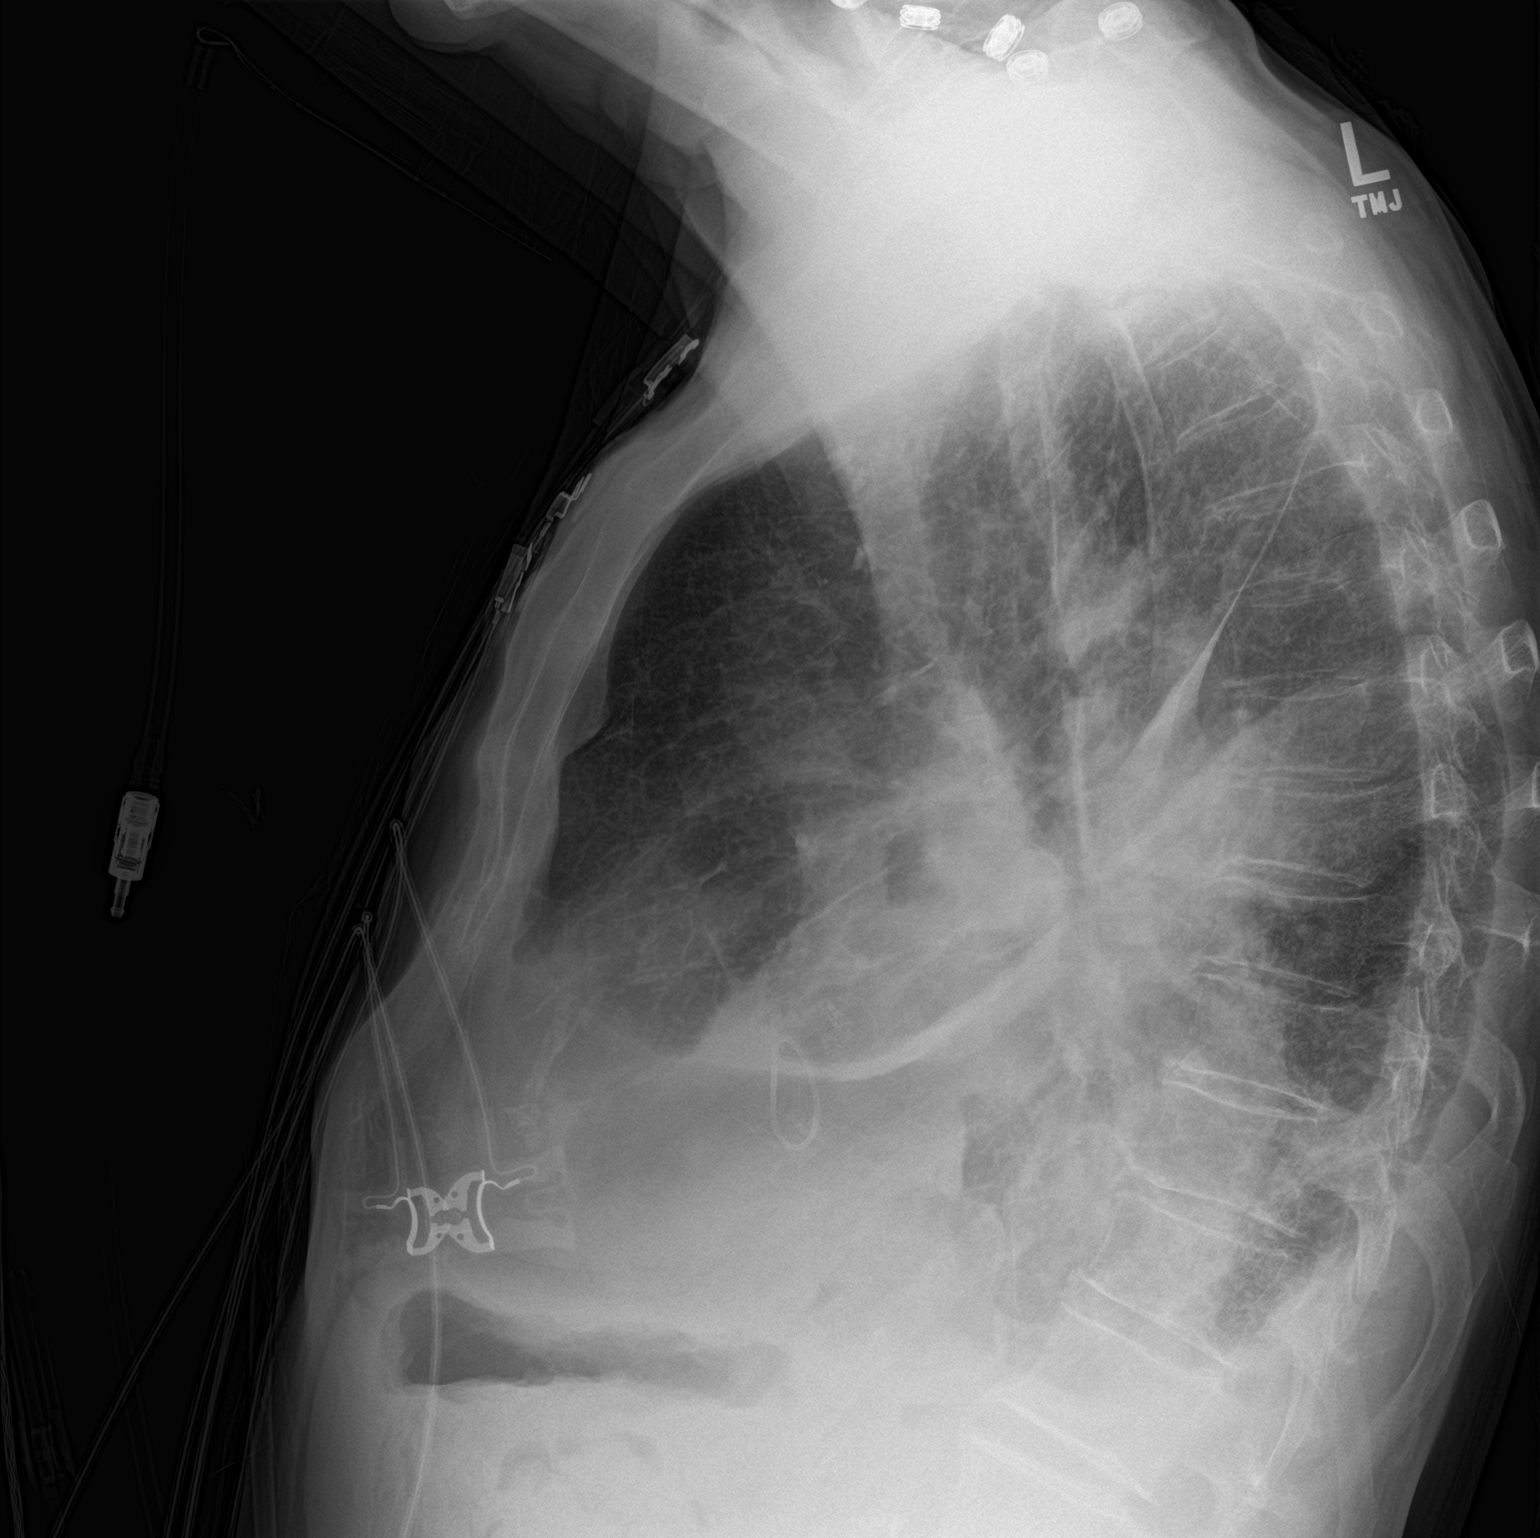

[2 of 2 positions shown; findings below may reference images not displayed]

FINDINGS: Cardiac shadow is enlarged. Left-sided pleural effusion is again
noted and stable. A smaller right-sided effusion is noted as well.
There also appears to be some fluid within the major fissure on the
right stable from the prior exam degree of vascular congestion has
improved. Persistent bibasilar atelectasis/infiltrate is noted.
IMPRESSION: Stable bilateral pleural effusions.

Bibasilar changes worse on the left than the right

## 2016-11-05 ENCOUNTER — Other Ambulatory Visit: Payer: Self-pay | Admitting: Thoracic Surgery (Cardiothoracic Vascular Surgery)

## 2016-11-05 DIAGNOSIS — I2510 Atherosclerotic heart disease of native coronary artery without angina pectoris: Secondary | ICD-10-CM

## 2016-11-09 ENCOUNTER — Ambulatory Visit: Payer: Medicare Other | Admitting: Thoracic Surgery (Cardiothoracic Vascular Surgery)

## 2016-11-17 ENCOUNTER — Ambulatory Visit
Admission: RE | Admit: 2016-11-17 | Discharge: 2016-11-17 | Disposition: A | Discharge status: Home / Self Care | Payer: Medicare Other | Source: Ambulatory Visit | Attending: Thoracic Surgery (Cardiothoracic Vascular Surgery) | Admitting: Thoracic Surgery (Cardiothoracic Vascular Surgery)

## 2016-11-17 ENCOUNTER — Ambulatory Visit (INDEPENDENT_AMBULATORY_CARE_PROVIDER_SITE_OTHER): Payer: Self-pay | Admitting: Thoracic Surgery (Cardiothoracic Vascular Surgery)

## 2016-11-17 ENCOUNTER — Encounter: Payer: Self-pay | Admitting: Thoracic Surgery (Cardiothoracic Vascular Surgery)

## 2016-11-17 VITALS — BP 120/66 | HR 69 | Resp 16 | Ht 71.5 in | Wt 145.0 lb

## 2016-11-17 DIAGNOSIS — Z952 Presence of prosthetic heart valve: Secondary | ICD-10-CM

## 2016-11-17 DIAGNOSIS — I071 Rheumatic tricuspid insufficiency: Secondary | ICD-10-CM

## 2016-11-17 DIAGNOSIS — Z9889 Other specified postprocedural states: Secondary | ICD-10-CM

## 2016-11-17 DIAGNOSIS — I050 Rheumatic mitral stenosis: Secondary | ICD-10-CM

## 2016-11-17 DIAGNOSIS — I2510 Atherosclerotic heart disease of native coronary artery without angina pectoris: Secondary | ICD-10-CM

## 2016-11-17 DIAGNOSIS — Z953 Presence of xenogenic heart valve: Secondary | ICD-10-CM

## 2016-11-17 NOTE — Progress Notes (Signed)
[address]       [name] [id number]             [phone]     CARDIOTHORACIC SURGERY OFFICE NOTE  Referring Provider is Caryl Adahiu, Jenyung Andy, MD PCP is Tarri FullerESCAJEDA, RICHARD, MD   HPI:  Patient returns to the office today for routine follow-up status post redo mitral valve replacement using a bioprosthetic tissue valve and tricuspid valve repair on 10/01/2016.  His postoperative recovery was remarkably uncomplicated and he was discharged home on the seventh postoperative day. Despite an unusual amount of stress at home unrelated to the patient's clinical problems, he has done exceptionally well. He has been seen in follow-up recently by Dr. Rhona Leavenshiu who has been monitoring his prothrombin time and adjusting his Coumadin dose accordingly. He returns to our office for routine follow-up today. He reports that he is doing exceptionally well. He has had no pain in his chest and he has not required any pain relievers. He reports no shortness of breath and he notes that his breathing is dramatically better than it was prior to surgery. He is sleeping well at night. His appetite is good. He has not had palpitations or dizzy spells. He plans to start the outpatient cardiac rehabilitation program just after the first of the year. Overall he is delighted with his progress.  Current Outpatient Prescriptions  Medication Sig Dispense Refill  . amiodarone (PACERONE) 200 MG tablet Take 1 tablet (200 mg total) by mouth daily. 30 tablet 1  . aspirin EC 81 MG EC tablet Take 1 tablet (81 mg total) by mouth daily.    Marland Kitchen. atorvastatin (LIPITOR) 20 MG tablet Take 20 mg by mouth daily at 6 PM.     . budesonide-formoterol (SYMBICORT) 80-4.5 MCG/ACT inhaler Inhale 2 puffs into the lungs 2 (two) times daily.    . Calcium Carbonate-Vitamin D3 (CALCIUM 600/VITAMIN D) 600-400 MG-UNIT TABS Take 1 tablet by mouth daily.    . furosemide (LASIX) 40 MG tablet Take 40 mg by mouth in the am and 40 mg by mouth in  the pm (Patient taking differently: 40 mg daily. ) 60 tablet 1  . pantoprazole (PROTONIX) 40 MG tablet Take 1 tablet (40 mg total) by mouth 2 (two) times daily. TAKE AT 0600 60 tablet 6  . Potassium Chloride ER 20 MEQ TBCR Take 20 mEq by mouth 2 (two) times daily. 60 tablet 1  . sildenafil (VIAGRA) 100 MG tablet Take 100 mg by mouth daily as needed for erectile dysfunction.    Marland Kitchen. warfarin (COUMADIN) 2.5 MG tablet Take 1 tablet (2.5 mg total) by mouth daily at 6 PM. Or as directed. 30 tablet 1  . SPIRIVA HANDIHALER 18 MCG inhalation capsule Place 18 mcg into inhaler and inhale daily.      No current facility-administered medications for this visit.       Physical Exam:   BP 120/66 (BP Location: Right Arm, Patient Position: Sitting, Cuff Size: Normal)   Pulse 69   Resp 16   Ht 5' 11.5" (1.816 m)   Wt 145 lb (65.8 kg)   SpO2 97% Comment: RA  BMI 19.94 kg/m   General:  Well-appearing  Chest:   Clear to auscultation  CV:   Regular rate and rhythm without murmur  Incisions:  Healing nicely, sternum is stable  Abdomen:  Soft and nontender  Extremities:  Warm and well perfused, no edema  Diagnostic Tests:  CHEST  2 VIEW  COMPARISON:  11/7/ 17  FINDINGS: Cardiomediastinal silhouette is stable. Again noted status post median sternotomy and cardiac valve replacement. Streaky atelectasis or infiltrate in right lower lobe. There is loculated left lower lateral small pleural effusion. Mild atelectasis left base. No convincing pulmonary edema.  IMPRESSION: Again noted status post median sternotomy and cardiac valve replacement. Streaky atelectasis or infiltrate in right lower lobe. There is loculated left lower lateral small pleural effusion. Mild atelectasis left base. No convincing pulmonary edema.   Electronically Signed   By: Natasha MeadLiviu  Pop M.D.   On: 11/17/2016 13:46    Impression:  Patient is doing remarkably well just 6 weeks status post redo mitral valve replacement  using a bioprosthetic tissue valve and tricuspid valve repair.  Plan:  I have encouraged the patient to continue to gradually increase his physical activity as tolerated but to refrain from any heavy lifting or strenuous use of his arms or shoulders for at least another 6-8 weeks. We have not recommended any changes to his current medications. It might be reasonable to consider stopping amiodarone if he remains in sinus rhythm for another 6-8 weeks.  I have encouraged the patient to participate in outpatient cardiac rehabilitation program. All of his questions been addressed. He will continue to follow-up closely with Dr. Rhona Leavenshiu and return to our office for routine follow-up in approximately 2 months or sooner should specific problems or questions arise.    Salvatore Decentlarence H. Cornelius Moraswen, MD 11/17/2016 2:47 PM

## 2016-11-17 NOTE — Patient Instructions (Signed)
Continue all previous medications without any changes at this time  Continue to avoid any heavy lifting or strenuous use of your arms or shoulders for at least a total of three months from the time of surgery.  After three months you may gradually increase how much you lift or otherwise use your arms or chest as tolerated, with limits based upon whether or not activities lead to the return of significant discomfort.  You may return to driving an automobile as long as you are no longer requiring oral narcotic pain relievers during the daytime.  It would be wise to start driving only short distances during the daylight and gradually increase from there as you feel comfortable.  You are encouraged to enroll and participate in the outpatient cardiac rehab program beginning as soon as practical.  

## 2017-01-10 IMAGING — CR DG CHEST 2V
2 series · 2 of 2 positions shown · non-contrast
Comparison: [DATE]

CLINICAL DATA: Tricuspid valve replacement

EXAM:
CHEST  2 VIEW

[w chest pa]
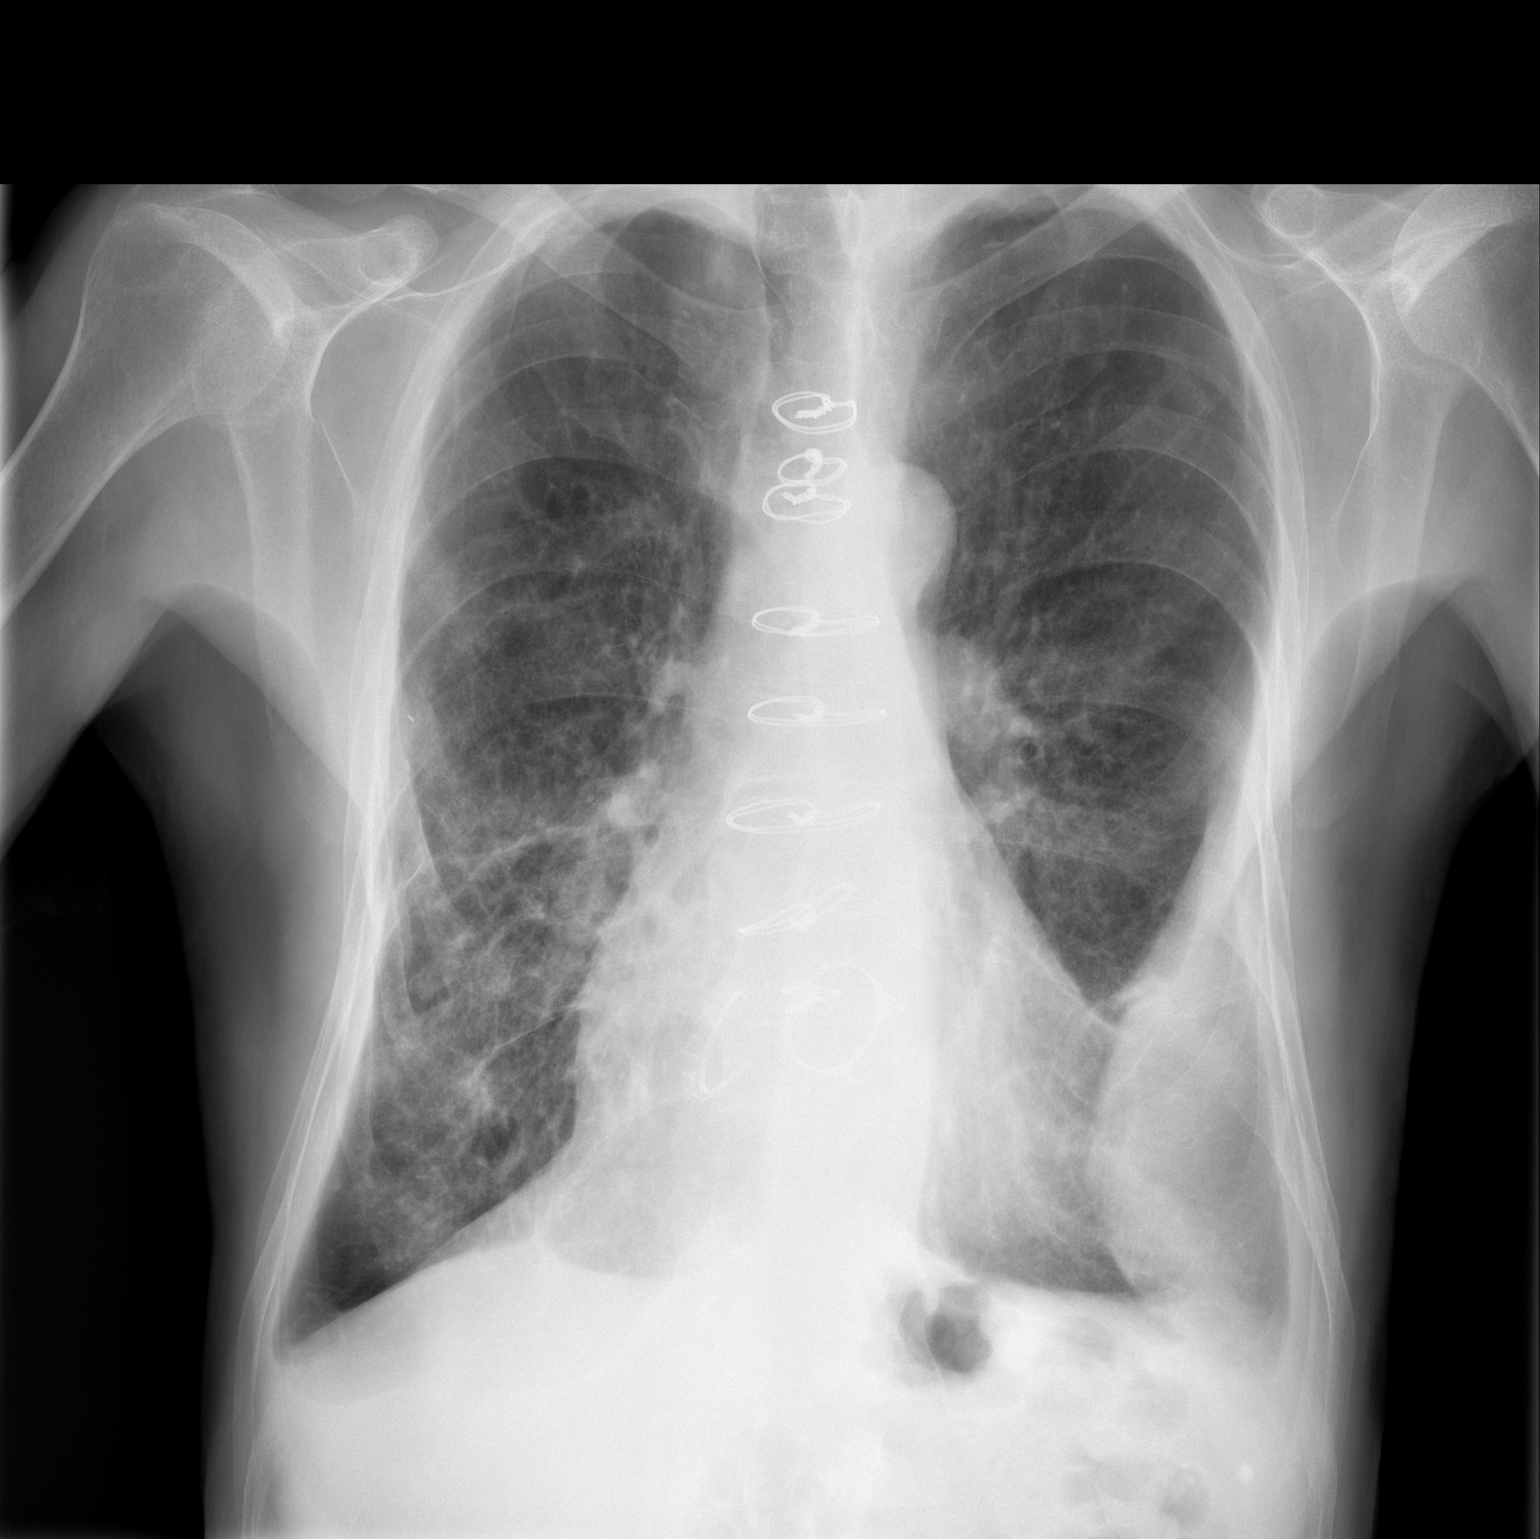

[w chest lat]
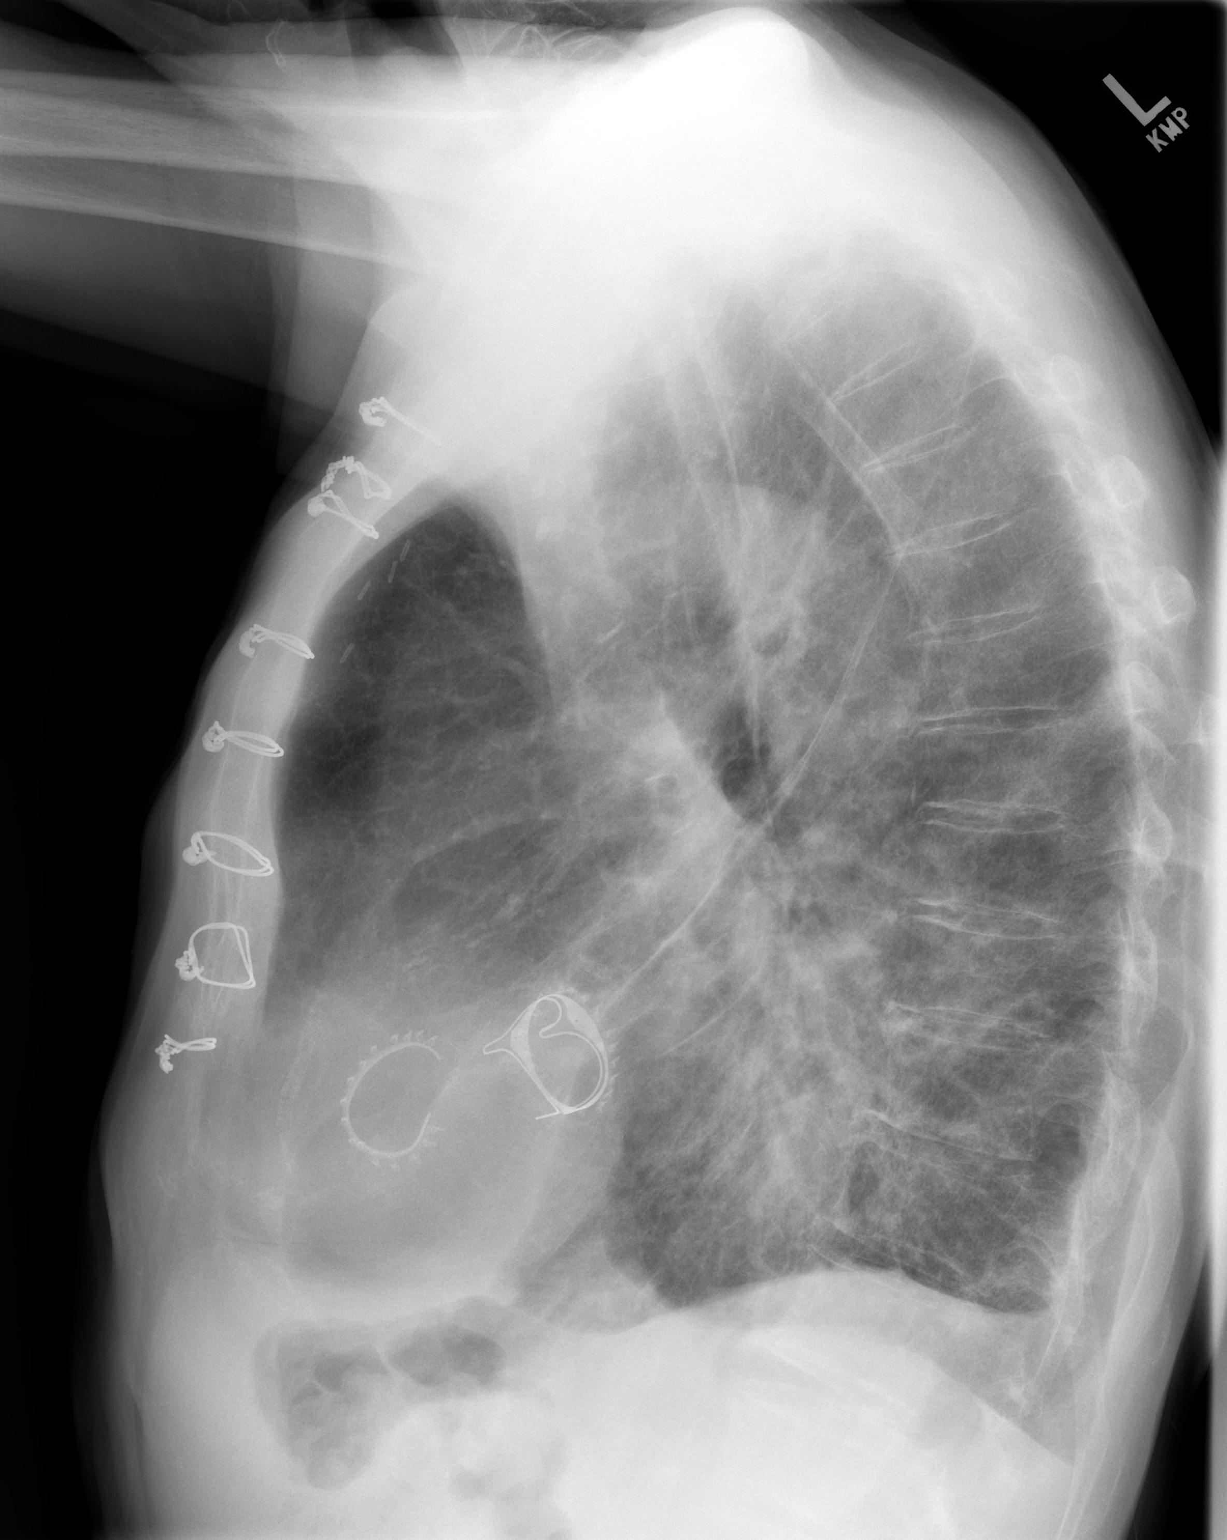

[2 of 2 positions shown; findings below may reference images not displayed]

FINDINGS: Cardiomediastinal silhouette is stable. Again noted status post
median sternotomy and cardiac valve replacement. Streaky atelectasis
or infiltrate in right lower lobe. There is loculated left lower
lateral small pleural effusion. Mild atelectasis left base. No
convincing pulmonary edema.
IMPRESSION: Again noted status post median sternotomy and cardiac valve
replacement. Streaky atelectasis or infiltrate in right lower lobe.
There is loculated left lower lateral small pleural effusion. Mild
atelectasis left base. No convincing pulmonary edema.

## 2017-01-15 ENCOUNTER — Other Ambulatory Visit: Payer: Self-pay | Admitting: Thoracic Surgery (Cardiothoracic Vascular Surgery)

## 2017-01-15 DIAGNOSIS — I25119 Atherosclerotic heart disease of native coronary artery with unspecified angina pectoris: Secondary | ICD-10-CM

## 2017-01-18 ENCOUNTER — Ambulatory Visit (INDEPENDENT_AMBULATORY_CARE_PROVIDER_SITE_OTHER): Payer: Medicare Other | Admitting: Thoracic Surgery (Cardiothoracic Vascular Surgery)

## 2017-01-18 ENCOUNTER — Encounter: Payer: Self-pay | Admitting: Thoracic Surgery (Cardiothoracic Vascular Surgery)

## 2017-01-18 VITALS — BP 105/60 | HR 63 | Resp 20 | Ht 71.0 in | Wt 145.0 lb

## 2017-01-18 DIAGNOSIS — Z9889 Other specified postprocedural states: Secondary | ICD-10-CM | POA: Diagnosis not present

## 2017-01-18 DIAGNOSIS — I25119 Atherosclerotic heart disease of native coronary artery with unspecified angina pectoris: Secondary | ICD-10-CM | POA: Diagnosis not present

## 2017-01-18 DIAGNOSIS — Z953 Presence of xenogenic heart valve: Secondary | ICD-10-CM | POA: Diagnosis not present

## 2017-01-18 NOTE — Progress Notes (Signed)
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 His postoperative recovery was remarkably uncomplicated and he was last seen here in our office on 11/17/2016 at which time he was doing quite well. Since then he has continued to do remarkably well. He has been followed carefully by Dr. Rhona Leavens in [facility] and he recently underwent a routine follow-up echocardiogram on 12/14/2016. By report his echocardiogram revealed low normal left ventricular systolic function with normal functioning bioprosthetic tissue valve in the mitral position and intact tricuspid valve repair was only trace residual tricuspid regurgitation. He returns to our office today for routine follow-up. He reports that he is doing very well. He reports essentially no shortness of breath. He only gets short of breath with very strenuous exertion, this does not limit his daily physical activities at all. He does not have any residual pain or soreness in his chest. His activity level is quite good. He has not had problems with Coumadin management and he has not had any episodes of recurrent GI bleeding. He denies any palpitations or other symptoms to suggest a recurrence of atrial fibrillation. Overall he is delighted with his progress.   Current Outpatient Prescriptions  Medication Sig Dispense  Refill  . amiodarone (PACERONE) 200 MG tablet Take 1 tablet (200 mg total) by mouth daily. 30 tablet 1  . aspirin EC 81 MG EC tablet Take 1 tablet (81 mg total) by mouth daily.    Marland Kitchen atorvastatin (LIPITOR) 20 MG tablet Take 20 mg by mouth daily at 6 PM.     . budesonide-formoterol (SYMBICORT) 80-4.5 MCG/ACT inhaler Inhale 2 puffs into the lungs 2 (two) times daily.    . Calcium Carbonate-Vitamin D3 (CALCIUM 600/VITAMIN D) 600-400 MG-UNIT TABS Take 1 tablet by mouth daily.    . furosemide (LASIX) 40 MG tablet Take 40 mg by mouth in the am and 40 mg by mouth in the pm (Patient taking differently: Take 40 mg by mouth daily. Take 40 mg po daily) 60 tablet 1  . pantoprazole (PROTONIX) 40 MG tablet Take 1 tablet (40 mg total) by mouth 2 (two) times daily. TAKE AT 0600 60 tablet 6  . Potassium Chloride ER 20 MEQ TBCR Take 20 mEq by mouth 2 (two) times daily. 60 tablet 1  . sildenafil (VIAGRA) 100 MG tablet Take 100 mg by mouth daily as needed for erectile dysfunction.    Marland Kitchen SPIRIVA HANDIHALER 18 MCG inhalation capsule Place 18 mcg into inhaler and inhale daily.     Marland Kitchen warfarin (COUMADIN) 2.5 MG tablet Take 1 tablet (2.5 mg total) by mouth daily at 6 PM. Or as directed. 30 tablet 1   No current facility-administered medications for this visit.       Physical Exam:   BP 105/60   Pulse 63   Resp  20   Ht 5\' 11"  (1.803 m)   Wt 145 lb (65.8 kg)   SpO2 99%   BMI 20.22 kg/m   General:  Thin but well-appearing  Chest:   Clear to auscultation  CV:   Regular rate and rhythm without murmur  Incisions:  Completely healed, sternum is stable  Abdomen:  Soft nontender  Extremities:  Warm and well-perfused  Diagnostic Tests:  Report from transthoracic echocardiogram performed [facility]/15/[facility] [place] [facility] is reviewed. By report there was low normal left ventricular systolic function with ejection fraction estimated 45%. There was moderate left atrial enlargement. There was a  bioprosthetic tissue valve in the mitral position that was functioning normally. Mean transvalvular gradient across mitral valve was estimated 5 mmHg. There was trace residual tricuspid regurgitation. No other significant abnormalities were noted.   Impression:  Patient is doing remarkably well approximately 3 months status post 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  Plan:  We have not recommended any changes to the patient's current medications. At some point it might be reasonable to consider weaning the patient off of amiodarone, but we will defer this decision to Dr. Johnna Acostahiu's discretion.  I have encouraged the patient to continue to gradually increase his physical activity without any particular limitations.  The patient has been reminded regarding the importance of dental hygiene and the lifelong need for antibiotic prophylaxis for all dental cleanings and other related invasive procedures.  The patient will continue to follow-up closely with Dr. Rhona Leavenshiu and we will plan to have him return to our office for routine follow-up next fall, approximately 1 year following his surgery. During the interim period time he will call and return to see us only should specific problems or questions arise.  I spent in excess of 15 minutes during the conduct of this office consultation and >50% of this time involved direct face-to-face encounter with the patient for counseling and/or coordination of their care.   Salvatore Decentlarence H. Cornelius Moraswen, MD 01/18/2017 2:52 PM

## 2017-01-18 NOTE — Patient Instructions (Signed)

## 2017-10-18 ENCOUNTER — Encounter: Payer: Self-pay | Admitting: Thoracic Surgery (Cardiothoracic Vascular Surgery)

## 2017-10-18 ENCOUNTER — Ambulatory Visit (INDEPENDENT_AMBULATORY_CARE_PROVIDER_SITE_OTHER): Admitting: Thoracic Surgery (Cardiothoracic Vascular Surgery)

## 2017-10-18 ENCOUNTER — Other Ambulatory Visit: Payer: Self-pay

## 2017-10-18 VITALS — BP 105/53 | HR 59 | Resp 16 | Ht 71.0 in | Wt 139.1 lb

## 2017-10-18 DIAGNOSIS — I25119 Atherosclerotic heart disease of native coronary artery with unspecified angina pectoris: Secondary | ICD-10-CM

## 2017-10-18 DIAGNOSIS — Z953 Presence of xenogenic heart valve: Secondary | ICD-10-CM

## 2017-10-18 NOTE — Patient Instructions (Signed)
Continue all previous medications without any changes at this time  Contact Dr Johnna Acostahiu's office to schedule follow up appointment as soon as possible  Do not drive an automobile without clearance from Dr Rhona Leavenshiu or your regular medical doctor  Endocarditis is a potentially serious infection of heart valves or inside lining of the heart.  It occurs more commonly in patients with diseased heart valves (such as patient's with aortic or mitral valve disease) and in patients who have undergone heart valve repair or replacement.  Certain surgical and dental procedures may put you at risk, such as dental cleaning, other dental procedures, or any surgery involving the respiratory, urinary, gastrointestinal tract, gallbladder or prostate gland.   To minimize your chances for develooping endocarditis, maintain good oral health and seek prompt medical attention for any infections involving the mouth, teeth, gums, skin or urinary tract.    Always notify your doctor or dentist about your underlying heart valve condition before having any invasive procedures. You will need to take antibiotics before certain procedures, including all routine dental cleanings or other dental procedures.  Your cardiologist or dentist should prescribe these antibiotics for you to be taken ahead of time.

## 2017-10-18 NOTE — Progress Notes (Signed)
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  He was last seen here in our office on January 18, 2017 at which time he was doing remarkably well.  Unfortunately, in early July he suffered an intracranial bleed in the setting of supratherapeutic levels of warfarin anticoagulation.  Systemic anticoagulation was stopped.  He had a complicated hospitalization with respiratory failure and acute renal failure for which dialysis therapy was necessary.   He slowly recovered and was discharged home, but was readmitted in early October with supraventricular tachycardia.  At that time renal function apparently was improved and dialysis was discontinued.  He was discharged home and immediately went to the beach with his wife.  He was there for several days and then became acutely ill and was [facility] [place].  According to his wife he was diagnosed with septic shock of unclear source.  He was ultimately discharged home with in-home hospice care.  Despite this he has recovered uneventfully and remained off of dialysis.  His temporary dialysis catheter was removed.  He has not seen any of [place] regular physicians [place] [facility] since [place].  He is  accompanied by his wife for his office visit today.  He states that he gets short of breath with activity but overall he feels okay.  He is driving an automobile short distances.  He is not walking much.  He denies fevers or chills.  He has not had chest pains.  Current Outpatient Medications  Medication Sig Dispense Refill  . acetaminophen (TYLENOL) 500 MG tablet Take 500 mg every 6 (six) hours as needed by mouth.    Marland Kitchen. amiodarone (PACERONE) 200 MG tablet Take 1 tablet (200 mg total) by mouth daily. 30 tablet 1  . Calcium Carbonate-Vitamin D3 (CALCIUM 600/VITAMIN D) 600-400 MG-UNIT TABS Take 1 tablet by mouth daily.    Marland Kitchen. docusate sodium (COLACE) 100 MG capsule Take 100 mg 2 (two) times daily by mouth.    . Methyl Salicylate-Lido-Menthol 16-4-2 % PTCH Apply 1 patch daily topically.    . OXYGEN Inhale 1-2 L/min as needed into the lungs.    . pantoprazole (PROTONIX) 40 MG tablet Take 1 tablet (40 mg total) by mouth 2 (two) times daily. TAKE AT 0600 60 tablet 6  . traMADol (ULTRAM) 50 MG tablet Take every 12 (twelve) hours as needed by mouth.    . Vitamin D, Ergocalciferol, (DRISDOL) 50000 units CAPS capsule Take 50,000 Units every 7 (seven) days by mouth.    Marland Kitchen. aspirin EC 81 MG EC tablet Take 1 tablet (81 mg total) by mouth daily.    . furosemide (LASIX) 40 MG tablet Take 40 mg by mouth in the am and 40 mg by  mouth in the pm (Patient taking differently: Take 40 mg by mouth daily. Take 40 mg po daily) 60 tablet 1  . Potassium Chloride ER 20 MEQ TBCR Take 20 mEq by mouth 2 (two) times daily. 60 tablet 1  . sildenafil (VIAGRA) 100 MG tablet Take 100 mg by mouth daily as needed for erectile dysfunction.    Marland Kitchen. SPIRIVA HANDIHALER 18 MCG inhalation capsule Place 18 mcg into inhaler and inhale daily.      No current facility-administered medications for this visit.       Physical Exam:   BP (!) 105/53 (BP Location: Right Arm, Patient Position: Sitting, Cuff Size: Normal)   Pulse (!) 59   Resp 16   Ht  5\' 11"  (1.803 m)   Wt 139 lb 1.6 oz (63.1 kg)   SpO2 98% Comment: ON RA  BMI 19.40 kg/m   General:  Elderly and frail with somewhat jaundiced appearance  Chest:   Clear to auscultation with symmetrical breath sounds  CV:   Regular rate and rhythm without murmur  Incisions:  n/a  Abdomen:  Soft nontender  Extremities:  Warm and well perfused with moderate bilateral lower extremity edema at the ankles  Diagnostic Tests:  Report from transthoracic echocardiogram performed June 11, 2017 is reviewed and notable for the presence of normal left ventricular size and systolic function with normal functioning bioprosthetic tissue valve in the mitral position and no significant tricuspid stenosis or tricuspid regurgitation   Impression:  Patient currently appears stable from a cardiac standpoint approximately 1 year status post 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  However, he has had very difficult past 6 months beginning with intracranial bleed which occurred in July of this year when the setting of supratherapeutic level of Coumadin.  He is now off all anticoagulation other than aspirin.  In October he had an episode of supraventricular tachycardia.  Several weeks ago [place] [facility] [place].  He was discharged home on hospice care but has recovered remarkably well.  He does not appear to be in significant congestive heart failure at this time.  He does appear jaundiced on exam.  Long-term goals of care are not clear and the patient insists that he would wish to be treated aggressively if he were to become ill again in the future.  However, he probably would opt not to resume dialysis should he develop recurrent renal failure.  Plan:  I have suggested that the patient schedule a follow-up appointment with Dr. Rhona Leavenshiu to keep him abreast of recent developments and involve him in both short and long term care  planning.  The patient appears to be remarkably stable but certainly has many complex medical problems.  Most recent echocardiogram revealed normal left ventricular function.  He remains at high risk for recurrent atrial fibrillation and as such remains at high risk for thromboembolism and stroke.  He suffered a intracranial bleed last summer while anticoagulated using warfarin, although he was notably supratherapeutic at the time.  Whether or not he could tolerate systemic anticoagulation using a DOAC is unclear.  Long-term prognosis with regards to his chronic kidney disease is unclear.   I spent in excess of 15 minutes during the conduct of this office consultation and >50% of this time involved direct face-to-face encounter with the patient for counseling and/or coordination of their care.   Salvatore Decentlarence H. Cornelius Moraswen, MD 10/18/2017 1:07 PM

## 2017-12-31 DEATH — deceased
